# Patient Record
Sex: Female | Born: 1935
Health system: Southern US, Community
[De-identification: ages and names within clinical notes are randomized; demographics above are authoritative.]

## PROBLEM LIST (undated history)

## (undated) DIAGNOSIS — I8390 Asymptomatic varicose veins of unspecified lower extremity: Secondary | ICD-10-CM

## (undated) DIAGNOSIS — K219 Gastro-esophageal reflux disease without esophagitis: Secondary | ICD-10-CM

## (undated) DIAGNOSIS — Z972 Presence of dental prosthetic device (complete) (partial): Secondary | ICD-10-CM

## (undated) DIAGNOSIS — D649 Anemia, unspecified: Secondary | ICD-10-CM

## (undated) DIAGNOSIS — K52832 Lymphocytic colitis: Secondary | ICD-10-CM

## (undated) DIAGNOSIS — M1990 Unspecified osteoarthritis, unspecified site: Secondary | ICD-10-CM

## (undated) DIAGNOSIS — C50919 Malignant neoplasm of unspecified site of unspecified female breast: Secondary | ICD-10-CM

## (undated) DIAGNOSIS — Z923 Personal history of irradiation: Secondary | ICD-10-CM

## (undated) DIAGNOSIS — I82409 Acute embolism and thrombosis of unspecified deep veins of unspecified lower extremity: Secondary | ICD-10-CM

## (undated) DIAGNOSIS — K5190 Ulcerative colitis, unspecified, without complications: Secondary | ICD-10-CM

## (undated) DIAGNOSIS — I10 Essential (primary) hypertension: Secondary | ICD-10-CM

## (undated) DIAGNOSIS — N183 Chronic kidney disease, stage 3 unspecified: Secondary | ICD-10-CM

## (undated) HISTORY — PX: HERNIA REPAIR: SHX51

## (undated) HISTORY — DX: Essential (primary) hypertension: I10

## (undated) HISTORY — PX: CATARACT EXTRACTION W/ INTRAOCULAR LENS IMPLANT: SHX1309

## (undated) HISTORY — PX: DILATION AND CURETTAGE OF UTERUS: SHX78

## (undated) HISTORY — DX: Lymphocytic colitis: K52.832

## (undated) HISTORY — PX: NECK SURGERY: SHX720

## (undated) HISTORY — PX: MELANOMA EXCISION: SHX5266

## (undated) HISTORY — PX: HYSTEROSCOPY: SHX211

## (undated) HISTORY — DX: Ulcerative colitis, unspecified, without complications: K51.90

---

## 1996-02-10 DIAGNOSIS — C50919 Malignant neoplasm of unspecified site of unspecified female breast: Secondary | ICD-10-CM | POA: Insufficient documentation

## 1996-02-10 DIAGNOSIS — Z923 Personal history of irradiation: Secondary | ICD-10-CM

## 1996-02-10 HISTORY — DX: Personal history of irradiation: Z92.3

## 1996-02-10 HISTORY — DX: Malignant neoplasm of unspecified site of unspecified female breast: C50.919

## 1996-02-10 HISTORY — PX: BREAST LUMPECTOMY: SHX2

## 2004-04-24 ENCOUNTER — Ambulatory Visit: Payer: Self-pay | Admitting: Oncology

## 2004-05-10 ENCOUNTER — Ambulatory Visit: Payer: Self-pay | Admitting: Oncology

## 2004-06-30 ENCOUNTER — Ambulatory Visit: Payer: Self-pay | Admitting: Oncology

## 2004-10-27 ENCOUNTER — Ambulatory Visit: Payer: Self-pay | Admitting: Oncology

## 2004-11-09 ENCOUNTER — Ambulatory Visit: Payer: Self-pay | Admitting: Oncology

## 2005-04-28 ENCOUNTER — Ambulatory Visit: Payer: Self-pay | Admitting: Oncology

## 2005-05-10 ENCOUNTER — Ambulatory Visit: Payer: Self-pay | Admitting: Oncology

## 2005-05-14 ENCOUNTER — Ambulatory Visit: Payer: Self-pay | Admitting: Gastroenterology

## 2005-06-10 ENCOUNTER — Ambulatory Visit: Payer: Self-pay | Admitting: Gastroenterology

## 2005-06-23 ENCOUNTER — Ambulatory Visit: Payer: Self-pay | Admitting: Oncology

## 2005-07-10 ENCOUNTER — Ambulatory Visit: Payer: Self-pay | Admitting: Oncology

## 2005-08-09 ENCOUNTER — Ambulatory Visit: Payer: Self-pay | Admitting: Oncology

## 2005-09-09 ENCOUNTER — Ambulatory Visit: Payer: Self-pay | Admitting: Oncology

## 2005-10-13 ENCOUNTER — Other Ambulatory Visit: Payer: Self-pay

## 2005-10-13 ENCOUNTER — Ambulatory Visit: Payer: Self-pay | Admitting: Unknown Physician Specialty

## 2005-10-15 ENCOUNTER — Ambulatory Visit: Payer: Self-pay | Admitting: Unknown Physician Specialty

## 2005-12-24 ENCOUNTER — Ambulatory Visit: Payer: Self-pay | Admitting: Oncology

## 2006-01-09 ENCOUNTER — Ambulatory Visit: Payer: Self-pay | Admitting: Oncology

## 2006-06-22 ENCOUNTER — Ambulatory Visit: Payer: Self-pay | Admitting: Oncology

## 2006-06-22 ENCOUNTER — Ambulatory Visit: Payer: Self-pay | Admitting: Internal Medicine

## 2006-07-11 ENCOUNTER — Ambulatory Visit: Payer: Self-pay | Admitting: Oncology

## 2006-07-11 ENCOUNTER — Ambulatory Visit: Payer: Self-pay | Admitting: Internal Medicine

## 2006-08-10 ENCOUNTER — Ambulatory Visit: Payer: Self-pay | Admitting: Oncology

## 2006-12-11 ENCOUNTER — Ambulatory Visit: Payer: Self-pay | Admitting: Oncology

## 2006-12-29 ENCOUNTER — Ambulatory Visit: Payer: Self-pay | Admitting: Oncology

## 2007-01-10 ENCOUNTER — Ambulatory Visit: Payer: Self-pay | Admitting: Oncology

## 2007-05-06 ENCOUNTER — Other Ambulatory Visit: Payer: Self-pay

## 2007-05-06 ENCOUNTER — Ambulatory Visit: Payer: Self-pay | Admitting: Unknown Physician Specialty

## 2007-05-12 ENCOUNTER — Ambulatory Visit: Payer: Self-pay | Admitting: Unknown Physician Specialty

## 2007-07-06 ENCOUNTER — Ambulatory Visit: Payer: Self-pay | Admitting: Oncology

## 2007-07-11 ENCOUNTER — Ambulatory Visit: Payer: Self-pay | Admitting: Oncology

## 2007-08-10 ENCOUNTER — Ambulatory Visit: Payer: Self-pay | Admitting: Oncology

## 2008-01-03 ENCOUNTER — Ambulatory Visit: Payer: Self-pay | Admitting: Oncology

## 2008-01-04 ENCOUNTER — Ambulatory Visit: Payer: Self-pay | Admitting: Oncology

## 2008-01-10 ENCOUNTER — Ambulatory Visit: Payer: Self-pay | Admitting: Oncology

## 2008-06-09 ENCOUNTER — Ambulatory Visit: Payer: Self-pay | Admitting: Oncology

## 2008-07-04 ENCOUNTER — Ambulatory Visit: Payer: Self-pay | Admitting: Oncology

## 2008-07-10 ENCOUNTER — Ambulatory Visit: Payer: Self-pay | Admitting: Oncology

## 2008-08-09 ENCOUNTER — Ambulatory Visit: Payer: Self-pay | Admitting: Oncology

## 2009-06-09 ENCOUNTER — Ambulatory Visit: Payer: Self-pay | Admitting: Nurse Practitioner

## 2009-07-04 ENCOUNTER — Ambulatory Visit: Payer: Self-pay | Admitting: Oncology

## 2009-07-10 ENCOUNTER — Ambulatory Visit: Payer: Self-pay | Admitting: Nurse Practitioner

## 2009-07-10 ENCOUNTER — Ambulatory Visit: Payer: Self-pay | Admitting: Oncology

## 2009-08-09 ENCOUNTER — Ambulatory Visit: Payer: Self-pay | Admitting: Oncology

## 2010-02-09 HISTORY — PX: COLONOSCOPY: SHX174

## 2010-07-02 LAB — HM PAP SMEAR: HM Pap smear: NEGATIVE

## 2010-07-05 LAB — HM COLONOSCOPY

## 2010-08-04 ENCOUNTER — Ambulatory Visit: Payer: Self-pay | Admitting: Internal Medicine

## 2010-08-05 LAB — HM MAMMOGRAPHY: HM Mammogram: NORMAL

## 2010-10-14 ENCOUNTER — Other Ambulatory Visit: Payer: Self-pay | Admitting: *Deleted

## 2010-10-14 MED ORDER — HYDRALAZINE HCL 25 MG PO TABS: 25.0000 mg | ORAL_TABLET | Freq: Three times a day (TID) | ORAL | Status: DC

## 2010-11-26 ENCOUNTER — Other Ambulatory Visit: Payer: Self-pay | Admitting: Internal Medicine

## 2010-11-26 MED ORDER — METOPROLOL SUCCINATE ER 50 MG PO TB24: 50.0000 mg | ORAL_TABLET | Freq: Two times a day (BID) | ORAL | Status: DC

## 2010-11-26 NOTE — Telephone Encounter (Signed)
Pt needs you to call in metoprolol  succ (toprol) er 50mg     Pt takes 2 tablets daily.  She is also taking steriods she will be off of this nov 1.  She only has enough for 13 days [facility] mail order

## 2011-01-13 ENCOUNTER — Other Ambulatory Visit: Payer: Self-pay | Admitting: Internal Medicine

## 2011-02-04 ENCOUNTER — Encounter: Payer: Self-pay | Admitting: Internal Medicine

## 2011-02-04 ENCOUNTER — Ambulatory Visit (INDEPENDENT_AMBULATORY_CARE_PROVIDER_SITE_OTHER): Payer: Medicare Other | Admitting: Internal Medicine

## 2011-02-04 VITALS — BP 142/82 | HR 68 | Temp 98.6°F | Wt 165.0 lb

## 2011-02-04 DIAGNOSIS — K52832 Lymphocytic colitis: Secondary | ICD-10-CM | POA: Insufficient documentation

## 2011-02-04 DIAGNOSIS — E785 Hyperlipidemia, unspecified: Secondary | ICD-10-CM

## 2011-02-04 DIAGNOSIS — M899 Disorder of bone, unspecified: Secondary | ICD-10-CM

## 2011-02-04 DIAGNOSIS — I10 Essential (primary) hypertension: Secondary | ICD-10-CM | POA: Insufficient documentation

## 2011-02-04 DIAGNOSIS — M8580 Other specified disorders of bone density and structure, unspecified site: Secondary | ICD-10-CM

## 2011-02-04 DIAGNOSIS — K5289 Other specified noninfective gastroenteritis and colitis: Secondary | ICD-10-CM

## 2011-02-04 MED ORDER — METOPROLOL SUCCINATE ER 100 MG PO TB24: 100.0000 mg | ORAL_TABLET | Freq: Every day | ORAL | Status: DC

## 2011-02-04 NOTE — Progress Notes (Signed)
Subjective:    [name]: Mia Perez, female    DOB: August 05, 1933, 77 y.o.   MRN: [id number]  HPI 77YO female with h/o lymphocytic colitis and hypertension presents for follow up. In regards to colitis, notes she was not able to taper off budesonide, as she developed recurrent diarrhea. At present, using 3mg  budesonide daily and symptoms typically well controlled. Still occasional loose stool with certain foods.  No abdominal pain, bloody stool. Notes some abdominal weight gain on steroids.   In regards to HTN, did not bring record of BP today. Reports full compliance with meds. No chest pain, palpitations, headache. No LEE.   Outpatient Encounter Prescriptions as of 02/04/2011  Medication Sig Dispense Refill  . amLODipine (NORVASC) 5 MG tablet Take 5 mg by mouth daily.        . brimonidine-timolol (COMBIGAN) 0.2-0.5 % ophthalmic solution Place 1 drop into both eyes every 12 (twelve) hours.        . budesonide (ENTOCORT EC) 3 MG 24 hr capsule Take 3 mg by mouth every morning.        . Calcium Carbonate-Vit D-Min 1200-1000 MG-UNIT CHEW Chew by mouth daily.        . clidinium-chlordiazePOXIDE (LIBRAX) 2.5-5 MG per capsule Take 1 capsule by mouth 3 (three) times daily as needed. For Loose stools or abdominal cramping       . diphenoxylate-atropine (LOMOTIL) 2.5-0.025 MG per tablet Take 1 tablet by mouth 4 (four) times daily as needed. For loose stools       . hydrALAZINE (APRESOLINE) 25 MG tablet Take 25 mg by mouth 3 (three) times daily as needed. For elevated BP       . metoprolol (TOPROL-XL) 100 MG 24 hr tablet Take 1 tablet (100 mg total) by mouth daily.  90 tablet  4  . valsartan (DIOVAN) 320 MG tablet Take 320 mg by mouth daily.          Review of Systems  Constitutional: Negative for fever, chills, appetite change, fatigue and unexpected weight change.  HENT: Negative for ear pain, congestion, sore throat, trouble swallowing, neck pain, voice change and sinus pressure.   Eyes: Negative  for visual disturbance.  Respiratory: Negative for cough, shortness of breath, wheezing and stridor.   Cardiovascular: Negative for chest pain, palpitations and leg swelling.  Gastrointestinal: Positive for diarrhea (chronic). Negative for nausea, vomiting, abdominal pain, constipation, blood in stool, abdominal distention and anal bleeding.  Genitourinary: Negative for dysuria and flank pain.  Musculoskeletal: Negative for myalgias, arthralgias and gait problem.  Skin: Negative for color change and rash.  Neurological: Negative for dizziness and headaches.  Hematological: Negative for adenopathy. Does not bruise/bleed easily.  Psychiatric/Behavioral: Negative for suicidal ideas, sleep disturbance and dysphoric mood. The patient is not nervous/anxious.    BP 142/82  Pulse 68  Temp(Src) 98.6 F (37 C) (Oral)  Wt 165 lb (74.844 kg)  SpO2 96%     Objective:   Physical Exam  Constitutional: She is oriented to person, place, and time. She appears well-developed and well-nourished. No distress.  HENT:  Head: Normocephalic and atraumatic.  Right Ear: External ear normal.  Left Ear: External ear normal.  Nose: Nose normal.  Mouth/Throat: Oropharynx is clear and moist. No oropharyngeal exudate.  Eyes: Conjunctivae are normal. Pupils are equal, round, and reactive to light. Right eye exhibits no discharge. Left eye exhibits no discharge. No scleral icterus.  Neck: Normal range of motion. Neck supple. No tracheal deviation present. No thyromegaly present.  Cardiovascular: Normal rate, regular rhythm, normal heart sounds and intact distal pulses.  Exam reveals no gallop and no friction rub.   No murmur heard. Pulmonary/Chest: Effort normal and breath sounds normal. No respiratory distress. She has no wheezes. She has no rales. She exhibits no tenderness.  Abdominal: Soft. Bowel sounds are normal. She exhibits no distension and no mass. There is no tenderness. There is no rebound and no guarding.   Musculoskeletal: Normal range of motion. She exhibits no edema and no tenderness.  Lymphadenopathy:    She has no cervical adenopathy.  Neurological: She is alert and oriented to person, place, and time. No cranial nerve deficit. She exhibits normal muscle tone. Coordination normal.  Skin: Skin is warm and dry. No rash noted. She is not diaphoretic. No erythema. No pallor.  Psychiatric: She has a normal mood and affect. Her behavior is normal. Judgment and thought content normal.          Assessment & Plan:  1. Lymphocytic Colitis - Followed by Dr. Kinnie Scales. On budesonide. Has not been able to taper below 3mg  daily without severe diarrhea.  Symptoms fairly well controlled at this dose. Will continue. Will screen bone density given chronic steroid use.  2. Hypertension - BP fairly well controlled, esp in light of use of chronic steroids. Will check renal function with labs next week. Will continue current meds. Follow up 6 months.  3. Hyperlipidemia - Will check lipids and LFTs with labs next week.

## 2011-02-04 NOTE — Patient Instructions (Signed)
Labs next week.  Bone density test to be scheduled.  Follow up 6 months.

## 2011-02-12 ENCOUNTER — Other Ambulatory Visit (INDEPENDENT_AMBULATORY_CARE_PROVIDER_SITE_OTHER): Payer: Medicare Other | Admitting: *Deleted

## 2011-02-12 DIAGNOSIS — I10 Essential (primary) hypertension: Secondary | ICD-10-CM

## 2011-02-12 DIAGNOSIS — K52832 Lymphocytic colitis: Secondary | ICD-10-CM

## 2011-02-12 DIAGNOSIS — E785 Hyperlipidemia, unspecified: Secondary | ICD-10-CM

## 2011-02-12 DIAGNOSIS — K5289 Other specified noninfective gastroenteritis and colitis: Secondary | ICD-10-CM

## 2011-02-12 LAB — CBC WITH DIFFERENTIAL/PLATELET
Basophils Absolute: 0 10*3/uL (ref 0.0–0.1)
Eosinophils Absolute: 0.2 10*3/uL (ref 0.0–0.7)
Lymphocytes Relative: 22.5 % (ref 12.0–46.0)
Lymphs Abs: 1.7 10*3/uL (ref 0.7–4.0)
MCHC: 33.3 g/dL (ref 30.0–36.0)
Monocytes Relative: 4.9 % (ref 3.0–12.0)
Platelets: 265 10*3/uL (ref 150.0–400.0)
RDW: 15.1 % — ABNORMAL HIGH (ref 11.5–14.6)

## 2011-02-12 LAB — LIPID PANEL
Cholesterol: 195 mg/dL (ref 0–200)
HDL: 45.9 mg/dL (ref >39.00)
LDL Cholesterol: 127 mg/dL — ABNORMAL HIGH (ref 0–99)
Total CHOL/HDL Ratio: 4
Triglycerides: 113 mg/dL (ref 0.0–149.0)

## 2011-02-12 LAB — COMPREHENSIVE METABOLIC PANEL
AST: 14 U/L (ref 0–37)
Alkaline Phosphatase: 83 U/L (ref 39–117)
CO2: 26 mEq/L (ref 19–32)
Chloride: 111 mEq/L (ref 96–112)
Glucose, Bld: 82 mg/dL (ref 70–99)
Potassium: 3.2 mEq/L — ABNORMAL LOW (ref 3.5–5.1)
Sodium: 145 mEq/L (ref 135–145)

## 2011-02-13 ENCOUNTER — Telehealth: Payer: Self-pay | Admitting: *Deleted

## 2011-02-13 MED ORDER — POTASSIUM CHLORIDE CRYS ER 20 MEQ PO TBCR: 20.0000 meq | EXTENDED_RELEASE_TABLET | Freq: Every day | ORAL | Status: DC

## 2011-02-13 NOTE — Telephone Encounter (Signed)
Patient informed. 

## 2011-02-13 NOTE — Telephone Encounter (Signed)
Message copied by Vernie Murders on Fri Feb 13, 2011  6:19 PM ------      Message from: Duncan Dull      Created: Fri Feb 13, 2011  5:41 PM       Dr Dan Humphreys can discuss her cholesterol with her, but her potassium is low at 3.2  She takes a a daily diuretic,  Can you call her in potassium chloride 20 meq and have her take  One tablet daily  #30 .  No refills she may not need it long term so should recheck a BMET in one week to see if we can stop it 276.8

## 2011-02-17 ENCOUNTER — Ambulatory Visit: Payer: Self-pay | Admitting: Internal Medicine

## 2011-02-19 ENCOUNTER — Other Ambulatory Visit (INDEPENDENT_AMBULATORY_CARE_PROVIDER_SITE_OTHER): Payer: Medicare Other | Admitting: *Deleted

## 2011-02-19 DIAGNOSIS — E876 Hypokalemia: Secondary | ICD-10-CM

## 2011-02-19 LAB — BASIC METABOLIC PANEL WITH GFR
BUN: 25 mg/dL — ABNORMAL HIGH (ref 6–23)
CO2: 30 meq/L (ref 19–32)
Calcium: 9.5 mg/dL (ref 8.4–10.5)
Chloride: 104 meq/L (ref 96–112)
Creatinine, Ser: 1 mg/dL (ref 0.4–1.2)
GFR: 58.11 mL/min — ABNORMAL LOW
Glucose, Bld: 81 mg/dL (ref 70–99)
Potassium: 4 meq/L (ref 3.5–5.1)
Sodium: 141 meq/L (ref 135–145)

## 2011-04-14 ENCOUNTER — Encounter: Payer: Self-pay | Admitting: Internal Medicine

## 2011-04-15 ENCOUNTER — Encounter: Payer: Self-pay | Admitting: Internal Medicine

## 2011-08-05 ENCOUNTER — Ambulatory Visit: Payer: BC Managed Care – PPO | Admitting: Internal Medicine

## 2011-08-05 ENCOUNTER — Ambulatory Visit (INDEPENDENT_AMBULATORY_CARE_PROVIDER_SITE_OTHER): Payer: Medicare Other | Admitting: Internal Medicine

## 2011-08-05 ENCOUNTER — Encounter: Payer: Self-pay | Admitting: Internal Medicine

## 2011-08-05 VITALS — BP 140/90 | HR 70 | Temp 98.7°F | Ht 67.75 in | Wt 176.8 lb

## 2011-08-05 DIAGNOSIS — I10 Essential (primary) hypertension: Secondary | ICD-10-CM

## 2011-08-05 DIAGNOSIS — Z0000 Encounter for general adult medical examination without abnormal findings: Secondary | ICD-10-CM

## 2011-08-05 DIAGNOSIS — Z1239 Encounter for other screening for malignant neoplasm of breast: Secondary | ICD-10-CM

## 2011-08-05 DIAGNOSIS — D649 Anemia, unspecified: Secondary | ICD-10-CM

## 2011-08-05 DIAGNOSIS — N952 Postmenopausal atrophic vaginitis: Secondary | ICD-10-CM

## 2011-08-05 DIAGNOSIS — K52832 Lymphocytic colitis: Secondary | ICD-10-CM

## 2011-08-05 DIAGNOSIS — K5289 Other specified noninfective gastroenteritis and colitis: Secondary | ICD-10-CM

## 2011-08-05 MED ORDER — AMLODIPINE BESYLATE 5 MG PO TABS: 5.0000 mg | ORAL_TABLET | Freq: Every day | ORAL | Status: DC

## 2011-08-05 MED ORDER — VALSARTAN 320 MG PO TABS: 320.0000 mg | ORAL_TABLET | Freq: Every day | ORAL | Status: DC

## 2011-08-05 MED ORDER — ESTRADIOL 0.1 MG/GM VA CREA: 2.0000 g | TOPICAL_CREAM | Freq: Every day | VAGINAL | Status: DC

## 2011-08-05 NOTE — Assessment & Plan Note (Addendum)
Symptoms well controlled with use of budesonide, however having some weight gain and elevated blood pressure with this medication. However, alternative medications have not been helpful. Will plan to continue for now. Encouraged her to limit total caloric intake and to continue his regular physical activity in effort to avoid further weight gain while on Budesonide.

## 2011-08-05 NOTE — Assessment & Plan Note (Signed)
General exam is normal today including breast exam. Pap is deferred because of patient's age. Health maintenance is up to date. Will check CBC, CMP, lipid profile with labs today. Encouraged patient to continue efforts at healthy diet, with goal of limiting total caloric intake to approximately 1500 calories per day given recent issues with weight gain with use of budesonide. Encouraged regular physical activity. Appropriate screening performed. Patient will followup in 6 months.

## 2011-08-05 NOTE — Patient Instructions (Signed)
Record everything that you eat.  Try to limit calories to 1500 or less per day. Continue to exercise daily.

## 2011-08-05 NOTE — Assessment & Plan Note (Signed)
Blood pressure slightly elevated today at 140/90. Suspect blood pressure may be increased with use of budesonide. Encouraged patient to monitor blood pressure and call if blood pressure consistently greater than 140/90 at home. Will check renal function with labs today. Patient will followup in 6 months or sooner as needed.

## 2011-08-05 NOTE — Progress Notes (Signed)
Subjective:    [name]: Mia Perez, female    DOB: March 02, 1933, 78 y.o.   MRN: [id number]  HPI The patient is here for annual Medicare wellness examination and management of other chronic and acute problems.   The risk factors are reflected in the social history.  The roster of all physicians providing medical care to patient - is listed in the Snapshot section of the chart.  Activities of daily living:  The patient is 100% independent in all ADLs: dressing, toileting, feeding as well as independent mobility  Home safety : The patient has smoke detectors in the home. They wear seatbelts.  There are firearms at home. There is no violence in the home.   There is no risks for hepatitis, STDs or HIV. There is no history of blood transfusion. They have no travel history to infectious disease endemic areas of the world.  The patient has seen their dentist in the last six month (Dr.Petersen). They have seen their eye doctor in the last year (Dr. Ilda Mori). No current issues with hearing. They have deferred audiologic testing in the last year.    They do not  have excessive sun exposure. Discussed the need for sun protection: hats, long sleeves and use of sunscreen if there is significant sun exposure.   Diet: the importance of a healthy diet is discussed. They do have a healthy diet.  The benefits of regular aerobic exercise were discussed. She walks 3-5 days per week.  Depression screen: there are no signs or vegative symptoms of depression- irritability, change in appetite, anhedonia, sadness/tearfullness.  Cognitive assessment: the patient manages all their financial and personal affairs and is actively engaged. They could relate day,date,year and events.  The following portions of the patient's history were reviewed and updated as appropriate: allergies, current medications, past family history, past medical history,  past surgical history, past social history  and problem  list.  Visual acuity was not assessed per patient preference since she has regular follow up with her ophthalmologist. Hearing and body mass index were assessed and reviewed.   During the course of the visit the patient was educated and counseled about appropriate screening and preventive services including : fall prevention , diabetes screening, nutrition counseling, colorectal cancer screening, and recommended immunizations.    In regards to chronic health issues, pt has h/o lymphocytic colitis. She notes that symptoms of diarrhea have been well controlled with the use of budesonide. She has, however noted some abdominal bloating and weight gain with this medication. She has been trying to limit calories and has been exercising on a regular basis, typically walking for 30 minutes 3-5 days per week.  In regards to hypertension, she does not regularly check her blood pressure at home. She does report full compliance with her medications. She denies any headache, palpitations, chest pain.  Outpatient Encounter Prescriptions as of 08/05/2011  Medication Sig Dispense Refill  . amLODipine (NORVASC) 5 MG tablet Take 1 tablet (5 mg total) by mouth daily.  90 tablet  4  . brimonidine-timolol (COMBIGAN) 0.2-0.5 % ophthalmic solution Place 1 drop into both eyes every 12 (twelve) hours.        . budesonide (ENTOCORT EC) 3 MG 24 hr capsule Take 3 mg by mouth every morning.        . Calcium Carbonate-Vit D-Min 1200-1000 MG-UNIT CHEW Chew by mouth daily.        . clidinium-chlordiazePOXIDE (LIBRAX) 2.5-5 MG per capsule Take 1 capsule by mouth 3 (three) times  daily as needed. For Loose stools or abdominal cramping       . diphenoxylate-atropine (LOMOTIL) 2.5-0.025 MG per tablet Take 1 tablet by mouth 4 (four) times daily as needed. For loose stools       . estradiol (ESTRACE VAGINAL) 0.1 MG/GM vaginal cream Place 0.25 Applicatorfuls vaginally daily. Insert one applicator vaginally as directed  42.5 g  6  .  metoprolol (TOPROL-XL) 100 MG 24 hr tablet Take 1 tablet (100 mg total) by mouth daily.  90 tablet  4  . valsartan (DIOVAN) 320 MG tablet Take 1 tablet (320 mg total) by mouth daily.  90 tablet  4   BP 140/90  Pulse 70  Temp 98.7 F (37.1 C) (Oral)  Ht 5' 7.75" (1.721 m)  Wt 176 lb 12 oz (80.173 kg)  BMI 27.07 kg/m2  SpO2 96%  Review of Systems  Constitutional: Negative for fever, chills, appetite change, fatigue and unexpected weight change.  HENT: Negative for ear pain, congestion, sore throat, trouble swallowing, neck pain, voice change and sinus pressure.   Eyes: Negative for visual disturbance.  Respiratory: Negative for cough, shortness of breath, wheezing and stridor.   Cardiovascular: Negative for chest pain, palpitations and leg swelling.  Gastrointestinal: Positive for abdominal distention. Negative for nausea, vomiting, abdominal pain, diarrhea, constipation, blood in stool and anal bleeding.  Genitourinary: Negative for dysuria and flank pain.  Musculoskeletal: Negative for myalgias, arthralgias and gait problem.  Skin: Negative for color change and rash.  Neurological: Negative for dizziness and headaches.  Hematological: Negative for adenopathy. Does not bruise/bleed easily.  Psychiatric/Behavioral: Negative for suicidal ideas, disturbed wake/sleep cycle and dysphoric mood. The patient is not nervous/anxious.        Objective:   Physical Exam  Constitutional: She is oriented to person, place, and time. She appears well-developed and well-nourished. No distress.  HENT:  Head: Normocephalic and atraumatic.  Right Ear: External ear normal.  Left Ear: External ear normal.  Nose: Nose normal.  Mouth/Throat: Oropharynx is clear and moist. No oropharyngeal exudate.  Eyes: Conjunctivae are normal. Pupils are equal, round, and reactive to light. Right eye exhibits no discharge. Left eye exhibits no discharge. No scleral icterus.  Neck: Normal range of motion. Neck supple.  No tracheal deviation present. No thyromegaly present.  Cardiovascular: Normal rate, regular rhythm, normal heart sounds and intact distal pulses.  Exam reveals no gallop and no friction rub.   No murmur heard. Pulmonary/Chest: Effort normal and breath sounds normal. No accessory muscle usage. Not tachypneic. No respiratory distress. She has no decreased breath sounds. She has no wheezes. She has no rhonchi. She has no rales. She exhibits no tenderness. Right breast exhibits no inverted nipple, no mass, no nipple discharge, no skin change and no tenderness. Left breast exhibits no inverted nipple, no mass, no nipple discharge, no skin change and no tenderness. Breasts are symmetrical.  Abdominal: Soft. Bowel sounds are normal. She exhibits no distension and no mass. There is no tenderness. There is no guarding.  Musculoskeletal: Normal range of motion. She exhibits no edema and no tenderness.  Lymphadenopathy:    She has no cervical adenopathy.  Neurological: She is alert and oriented to person, place, and time. No cranial nerve deficit. She exhibits normal muscle tone. Coordination normal.  Skin: Skin is warm and dry. No rash noted. She is not diaphoretic. No erythema. No pallor.  Psychiatric: She has a normal mood and affect. Her behavior is normal. Judgment and thought content normal.  Assessment & Plan:

## 2011-08-06 LAB — CBC WITH DIFFERENTIAL/PLATELET
Basophils Absolute: 0 10*3/uL (ref 0.0–0.1)
Lymphocytes Relative: 11.1 % — ABNORMAL LOW (ref 12.0–46.0)
Monocytes Relative: 4.3 % (ref 3.0–12.0)
Platelets: 291 10*3/uL (ref 150.0–400.0)
RDW: 14.9 % — ABNORMAL HIGH (ref 11.5–14.6)
WBC: 9.9 10*3/uL (ref 4.5–10.5)

## 2011-08-06 LAB — COMPREHENSIVE METABOLIC PANEL
AST: 20 U/L (ref 0–37)
BUN: 23 mg/dL (ref 6–23)
CO2: 25 mEq/L (ref 19–32)
Calcium: 9.6 mg/dL (ref 8.4–10.5)
Chloride: 107 mEq/L (ref 96–112)
Creatinine, Ser: 1.1 mg/dL (ref 0.4–1.2)
GFR: 50.86 mL/min — ABNORMAL LOW (ref >60.00)
Total Bilirubin: 0.3 mg/dL (ref 0.3–1.2)

## 2011-08-07 LAB — MICROALBUMIN / CREATININE URINE RATIO
Creatinine,U: 96.1 mg/dL
Microalb Creat Ratio: 2.5 mg/g (ref 0.0–30.0)

## 2011-09-07 ENCOUNTER — Telehealth: Payer: Self-pay | Admitting: Internal Medicine

## 2011-09-07 DIAGNOSIS — Z853 Personal history of malignant neoplasm of breast: Secondary | ICD-10-CM

## 2011-09-07 NOTE — Telephone Encounter (Signed)
Order entered and faxed to [phone].

## 2011-09-07 NOTE — Telephone Encounter (Signed)
Patient is having a diagnostic bilateral mammogram in the morning, please put an order in so we can fax it to [phone].  Patient has history of breast cancer that is why it can't be a screening.

## 2011-09-08 ENCOUNTER — Ambulatory Visit: Payer: Self-pay | Admitting: Internal Medicine

## 2011-09-15 ENCOUNTER — Encounter: Payer: Self-pay | Admitting: Internal Medicine

## 2011-11-10 ENCOUNTER — Other Ambulatory Visit: Payer: Self-pay | Admitting: Internal Medicine

## 2011-11-11 ENCOUNTER — Other Ambulatory Visit: Payer: Self-pay | Admitting: Internal Medicine

## 2011-12-03 ENCOUNTER — Telehealth: Payer: Self-pay | Admitting: Internal Medicine

## 2011-12-03 ENCOUNTER — Ambulatory Visit (INDEPENDENT_AMBULATORY_CARE_PROVIDER_SITE_OTHER): Payer: Medicare Other | Admitting: Internal Medicine

## 2011-12-03 ENCOUNTER — Encounter: Payer: Self-pay | Admitting: Internal Medicine

## 2011-12-03 VITALS — BP 122/68 | HR 88 | Temp 99.3°F | Ht 65.75 in | Wt 168.5 lb

## 2011-12-03 DIAGNOSIS — K5289 Other specified noninfective gastroenteritis and colitis: Secondary | ICD-10-CM

## 2011-12-03 DIAGNOSIS — J069 Acute upper respiratory infection, unspecified: Secondary | ICD-10-CM

## 2011-12-03 DIAGNOSIS — K52832 Lymphocytic colitis: Secondary | ICD-10-CM

## 2011-12-03 MED ORDER — AZITHROMYCIN 250 MG PO TABS: ORAL_TABLET | ORAL | Status: DC

## 2011-12-03 MED ORDER — ALBUTEROL SULFATE HFA 108 (90 BASE) MCG/ACT IN AERS: 2.0000 | INHALATION_SPRAY | Freq: Four times a day (QID) | RESPIRATORY_TRACT | Status: DC | PRN

## 2011-12-03 NOTE — Patient Instructions (Signed)
It was nice meeting you today.  I am sorry you have not been feeling well.  I am going to give you an antibiotic (Zpak) - to take as directed.  Mucinex DM in the am and Robitussin DM in the evening.  I am also going to prescribe an inhaler - 2 puffs four times per day as directed.  Rest.  Stay hydrated.  Let us know if symptoms do not resolve or if they worsen.

## 2011-12-03 NOTE — Telephone Encounter (Signed)
Caller: Mayo Ao; Patient Name: Mia Perez; PCP: Ronna Polio (Adults only); Best Callback Phone Number: [phone].  Pt calling today 12/03/11 regarding was at the the beach last week and was very windy.  When she got home she had laryngitis and now has cough.  Has been taking Mucinex DM.  Mucus is thick and clear.  However not coughing up much mucus.  Afebrile.  Emergent symptoms r/o by Cough Adult guidelines with exception of gradual onset of cough when lying down and relieved after being in a sitting or standing position.  (See Provider Within 24 Hours).  Appt scheduled for today at 11:15 AM with Dr. Dale [place].

## 2011-12-04 NOTE — Progress Notes (Signed)
  Subjective:    [name]: Anaaya Coulson, female    DOB: [birth year], 79 y.o.   MRN: [id number]  HPI 78 year old female with past history of hypertension and lymphocytic colitis who comes in today as a work in with concerns regarding increased congestion.  States she went to the beach and was out in the wind.  Subsequently lost her voice.  Then developed increased throat congestion.  Subjective fever a couple of days ago - stating that she could not get warm.  Increased congestion - mostly throat.  Occasionally productive of thick mucus.  No chest tightness or sob.  No history of asthma.  Has never used inhalers.  Some nausea earlier in the week.  Had one episode of emesis.  States she is staying hydrated.  Has issues with her bowels - lymphocytic colitis.    Past Medical History  Diagnosis Date  . Lymphocytic colitis   . Hypertension     Review of Systems Patient denies any headache, lightheadedness or dizziness.  No sinus symptoms.  States the congestion is "all in her throat".  No chest pain, tightness or palpitations.  No increased shortness of breath.  Some cough occasionally productive of thick sputum.  No abdominal pain or cramping.  Drinking plenty of fluids.   No bowel change.   No urine change.        Objective:   Physical Exam Filed Vitals:   12/03/11 1130  BP: 122/68  Pulse: 88  Temp: 99.3 F (22.31 C)   78 year old female in no acute distress.   HEENT:  Nares - clear.  OP- without lesions or erythema.  TMs visualized - without erythema. NECK:  Supple, nontender.    HEART:  Appears to be regular. LUNGS:  Without crackles or wheezing audible.  Increased congestion - cleared with coughing.  Respirations even and unlabored.   RADIAL PULSE:  Equal bilaterally.                    Assessment & Plan:  URI.  Treat with Zpak as directed.  Mucinex DM in the am and Robitussin DM in the evening.  Albuterol inhaler as directed.  Rest.  Fluids.  Explained to her if symptoms changed,  worsened or did not resolve - she was to be reevaluated.

## 2011-12-07 NOTE — Assessment & Plan Note (Signed)
Discussed antibiotic usage and bowel issues.  Add a probiotic or yogurt.  Follow.

## 2012-02-04 ENCOUNTER — Ambulatory Visit (INDEPENDENT_AMBULATORY_CARE_PROVIDER_SITE_OTHER): Payer: Medicare Other | Admitting: Internal Medicine

## 2012-02-04 ENCOUNTER — Encounter: Payer: Self-pay | Admitting: Internal Medicine

## 2012-02-04 VITALS — BP 140/88 | HR 90 | Temp 99.0°F | Resp 16 | Wt 167.2 lb

## 2012-02-04 DIAGNOSIS — J40 Bronchitis, not specified as acute or chronic: Secondary | ICD-10-CM

## 2012-02-04 DIAGNOSIS — Z1331 Encounter for screening for depression: Secondary | ICD-10-CM

## 2012-02-04 MED ORDER — LEVOFLOXACIN 500 MG PO TABS: 500.0000 mg | ORAL_TABLET | Freq: Every day | ORAL | Status: DC

## 2012-02-04 MED ORDER — HYDROCOD POLST-CHLORPHEN POLST 10-8 MG/5ML PO LQCR: 5.0000 mL | Freq: Two times a day (BID) | ORAL | Status: DC | PRN

## 2012-02-04 MED ORDER — PREDNISONE (PAK) 10 MG PO TABS: ORAL_TABLET | ORAL | Status: DC

## 2012-02-04 NOTE — Progress Notes (Signed)
Subjective:    [name]: Mia Perez, female    DOB: 1933/12/30, 78 y.o.   MRN: [id number]  HPI 78 year old female with history of hypertension and lymphocytic colitis presents for followup. Her primary concern today is a one-week history of nasal congestion, cough, and shortness of breath. She has had some subjective fever and chills at home.  Fever seems to have resolved. However, over the last week she has developed worsening cough and shortness of breath. Cough is productive of purulent sputum. She has been taking her inhalers including albuterol with no improvement. She has also been using some over-the-counter medications such as Mucinex with no improvement. She denies chest pain.  Outpatient Encounter Prescriptions as of 02/04/2012  Medication Sig Dispense Refill  . albuterol (PROVENTIL HFA;VENTOLIN HFA) 108 (90 BASE) MCG/ACT inhaler Inhale 2 puffs into the lungs every 6 (six) hours as needed for wheezing.  1 Inhaler  0  . amLODipine (NORVASC) 5 MG tablet TAKE 1 TABLET DAILY  90 tablet  49  . brimonidine-timolol (COMBIGAN) 0.2-0.5 % ophthalmic solution Place 1 drop into both eyes every 12 (twelve) hours.        . budesonide (ENTOCORT EC) 3 MG 24 hr capsule Take 3 mg by mouth daily.       . Calcium Carbonate-Vit D-Min 1200-1000 MG-UNIT CHEW Chew by mouth daily.        . clidinium-chlordiazePOXIDE (LIBRAX) 2.5-5 MG per capsule Take 1 capsule by mouth 3 (three) times daily as needed. For Loose stools or abdominal cramping       . DIOVAN 320 MG tablet TAKE 1 TABLET DAILY  90 tablet  49  . diphenoxylate-atropine (LOMOTIL) 2.5-0.025 MG per tablet Take 1 tablet by mouth 4 (four) times daily as needed. For loose stools       . estradiol (ESTRACE VAGINAL) 0.1 MG/GM vaginal cream Place 0.25 Applicatorfuls vaginally daily. Insert one applicator vaginally as directed  42.5 g  6  . metoprolol (TOPROL-XL) 100 MG 24 hr tablet Take 1 tablet (100 mg total) by mouth daily.  90 tablet  4   BP 140/88   Pulse 90  Temp 99 F (37.2 C) (Oral)  Resp 16  Wt 167 lb 4 oz (75.864 kg)  SpO2 92%  Review of Systems  Constitutional: Positive for fever, chills and fatigue. Negative for unexpected weight change.  HENT: Positive for nosebleeds, congestion, sore throat, rhinorrhea and postnasal drip. Negative for hearing loss, ear pain, facial swelling, sneezing, mouth sores, trouble swallowing, neck pain, neck stiffness, voice change, sinus pressure, tinnitus and ear discharge.   Eyes: Negative for pain, discharge, redness and visual disturbance.  Respiratory: Positive for cough, shortness of breath and wheezing. Negative for chest tightness and stridor.   Cardiovascular: Negative for chest pain, palpitations and leg swelling.  Musculoskeletal: Negative for myalgias and arthralgias.  Skin: Negative for color change and rash.  Neurological: Negative for dizziness, weakness, light-headedness and headaches.  Hematological: Negative for adenopathy.       Objective:   Physical Exam  Constitutional: She is oriented to person, place, and time. She appears well-developed and well-nourished. No distress.  HENT:  Head: Normocephalic and atraumatic.  Right Ear: External ear normal.  Left Ear: External ear normal.  Nose: Nose normal.  Mouth/Throat: Oropharynx is clear and moist. No oropharyngeal exudate.  Eyes: Conjunctivae normal are normal. Pupils are equal, round, and reactive to light. Right eye exhibits no discharge. Left eye exhibits no discharge. No scleral icterus.  Neck: Normal range of motion.  Neck supple. No tracheal deviation present. No thyromegaly present.  Cardiovascular: Normal rate, regular rhythm, normal heart sounds and intact distal pulses.  Exam reveals no gallop and no friction rub.   No murmur heard. Pulmonary/Chest: Effort normal. No accessory muscle usage. Not tachypneic. No respiratory distress. She has decreased breath sounds (prolonged expiration). She has wheezes. She has rhonchi.  She has no rales. She exhibits no tenderness.  Musculoskeletal: Normal range of motion. She exhibits no edema and no tenderness.  Lymphadenopathy:    She has no cervical adenopathy.  Neurological: She is alert and oriented to person, place, and time. No cranial nerve deficit. She exhibits normal muscle tone. Coordination normal.  Skin: Skin is warm and dry. No rash noted. She is not diaphoretic. No erythema. No pallor.  Psychiatric: She has a normal mood and affect. Her behavior is normal. Judgment and thought content normal.          Assessment & Plan:

## 2012-02-04 NOTE — Assessment & Plan Note (Signed)
Symptoms and exam are most consistent with acute bronchitis. Will treat with prednisone taper, Levaquin, and use Tussionex as needed for cough. Patient will call if symptoms are not improving over the next 24-48 hours. If no improvement, would favor getting chest x-ray. She will return to clinic for followup in 4 days.

## 2012-02-08 ENCOUNTER — Encounter: Payer: Self-pay | Admitting: Internal Medicine

## 2012-02-08 ENCOUNTER — Ambulatory Visit (INDEPENDENT_AMBULATORY_CARE_PROVIDER_SITE_OTHER): Payer: Medicare Other | Admitting: Internal Medicine

## 2012-02-08 VITALS — BP 150/78 | HR 61 | Temp 98.1°F | Resp 16 | Wt 172.0 lb

## 2012-02-08 DIAGNOSIS — J40 Bronchitis, not specified as acute or chronic: Secondary | ICD-10-CM

## 2012-02-08 NOTE — Assessment & Plan Note (Signed)
Symptoms and exam have improved. Will continue and finish prednisone taper and levaquin. Pt will RTC in 1 week for recheck. She will call sooner if any problems such as recurrent fever, dyspnea, chest pain.

## 2012-02-08 NOTE — Progress Notes (Signed)
Subjective:    [name]: Mia Perez, female    DOB: 10/23/1933, 78 y.o.   MRN: [id number]  HPI 78YO female with h/o lymphocytic colitis presents for recheck after recent visit for bronchitis. She reports symptoms of shortness of breath, cough have improved.  She continues to have some cough productive of purulent sputum. She notes some fatigue, and has not been able to complete some normal activities such as cleaning her home.  She denies any fever, chills, chest pain.  Outpatient Encounter Prescriptions as of 02/08/2012  Medication Sig Dispense Refill  . albuterol (PROVENTIL HFA;VENTOLIN HFA) 108 (90 BASE) MCG/ACT inhaler Inhale 2 puffs into the lungs every 6 (six) hours as needed for wheezing.  1 Inhaler  0  . amLODipine (NORVASC) 5 MG tablet TAKE 1 TABLET DAILY  90 tablet  49  . brimonidine-timolol (COMBIGAN) 0.2-0.5 % ophthalmic solution Place 1 drop into both eyes every 12 (twelve) hours.        . budesonide (ENTOCORT EC) 3 MG 24 hr capsule Take 3 mg by mouth daily.       . Calcium Carbonate-Vit D-Min 1200-1000 MG-UNIT CHEW Chew by mouth daily.        . chlorpheniramine-HYDROcodone (TUSSIONEX) 10-8 MG/5ML LQCR Take 5 mLs by mouth every 12 (twelve) hours as needed.  140 mL  0  . clidinium-chlordiazePOXIDE (LIBRAX) 2.5-5 MG per capsule Take 1 capsule by mouth 3 (three) times daily as needed. For Loose stools or abdominal cramping       . DIOVAN 320 MG tablet TAKE 1 TABLET DAILY  90 tablet  49  . diphenoxylate-atropine (LOMOTIL) 2.5-0.025 MG per tablet Take 1 tablet by mouth 4 (four) times daily as needed. For loose stools       . estradiol (ESTRACE VAGINAL) 0.1 MG/GM vaginal cream Place 0.25 Applicatorfuls vaginally daily. Insert one applicator vaginally as directed  42.5 g  6  . levofloxacin (LEVAQUIN) 500 MG tablet Take 1 tablet (500 mg total) by mouth daily.  7 tablet  0  . metoprolol (TOPROL-XL) 100 MG 24 hr tablet Take 1 tablet (100 mg total) by mouth daily.  90 tablet  4  .  predniSONE (STERAPRED UNI-PAK) 10 MG tablet Take 60mg  day 1 then taper by 10mg  daily  21 tablet  0   BP 150/78  Pulse 61  Temp 98.1 F (36.7 C) (Oral)  Resp 16  Wt 172 lb (78.019 kg)  SpO2 97%  Review of Systems  Constitutional: Positive for fatigue. Negative for fever, chills and unexpected weight change.  HENT: Negative for hearing loss, ear pain, nosebleeds, congestion, sore throat, facial swelling, rhinorrhea, sneezing, mouth sores, trouble swallowing, neck pain, neck stiffness, voice change, postnasal drip, sinus pressure, tinnitus and ear discharge.   Eyes: Negative for pain, discharge, redness and visual disturbance.  Respiratory: Positive for cough and shortness of breath. Negative for chest tightness, wheezing and stridor.   Cardiovascular: Negative for chest pain, palpitations and leg swelling.  Musculoskeletal: Negative for myalgias and arthralgias.  Skin: Negative for color change and rash.  Neurological: Negative for dizziness, weakness, light-headedness and headaches.  Hematological: Negative for adenopathy.       Objective:   Physical Exam  Constitutional: She is oriented to person, place, and time. She appears well-developed and well-nourished. No distress.  HENT:  Head: Normocephalic and atraumatic.  Right Ear: External ear normal.  Left Ear: External ear normal.  Nose: Nose normal.  Mouth/Throat: Oropharynx is clear and moist. No oropharyngeal exudate.  Eyes: Conjunctivae  normal are normal. Pupils are equal, round, and reactive to light. Right eye exhibits no discharge. Left eye exhibits no discharge. No scleral icterus.  Neck: Normal range of motion. Neck supple. No tracheal deviation present. No thyromegaly present.  Cardiovascular: Normal rate, regular rhythm, normal heart sounds and intact distal pulses.  Exam reveals no gallop and no friction rub.   No murmur heard. Pulmonary/Chest: Effort normal. No accessory muscle usage. Not tachypneic. No respiratory  distress. She has no decreased breath sounds. She has no wheezes. She has rhonchi (scattered rhonchi). She has no rales. She exhibits no tenderness.  Musculoskeletal: Normal range of motion. She exhibits no edema and no tenderness.  Lymphadenopathy:    She has no cervical adenopathy.  Neurological: She is alert and oriented to person, place, and time. No cranial nerve deficit. She exhibits normal muscle tone. Coordination normal.  Skin: Skin is warm and dry. No rash noted. She is not diaphoretic. No erythema. No pallor.  Psychiatric: She has a normal mood and affect. Her behavior is normal. Judgment and thought content normal.          Assessment & Plan:

## 2012-02-10 DIAGNOSIS — K5190 Ulcerative colitis, unspecified, without complications: Secondary | ICD-10-CM

## 2012-02-10 HISTORY — DX: Ulcerative colitis, unspecified, without complications: K51.90

## 2012-02-10 HISTORY — PX: BREAST SURGERY: SHX581

## 2012-02-10 HISTORY — PX: BREAST EXCISIONAL BIOPSY: SUR124

## 2012-02-15 ENCOUNTER — Encounter: Payer: Self-pay | Admitting: Internal Medicine

## 2012-02-15 ENCOUNTER — Ambulatory Visit (INDEPENDENT_AMBULATORY_CARE_PROVIDER_SITE_OTHER): Payer: Medicare PPO | Admitting: Internal Medicine

## 2012-02-15 VITALS — BP 110/74 | HR 60 | Temp 98.7°F | Ht 67.75 in | Wt 167.0 lb

## 2012-02-15 DIAGNOSIS — J40 Bronchitis, not specified as acute or chronic: Secondary | ICD-10-CM

## 2012-02-15 NOTE — Progress Notes (Signed)
Subjective:    [name]: Mia Perez, female    DOB: September 20, 1933, 79 y.o.   MRN: [id number]  HPI 79 year old female with history of colitis presents for followup after recent episode of bronchitis. She reports that she is much improved compared to previous visit. Cough has nearly completely resolved. She is not taking cough syrup and over 2 weeks. She continues to have some hoarseness of her voice, but this too seems to be improving. She denies any fever or chills. She reports her appetite and energy level have improved. She denies any new concerns today.  Outpatient Encounter Prescriptions as of 02/15/2012  Medication Sig Dispense Refill  . albuterol (PROVENTIL HFA;VENTOLIN HFA) 108 (90 BASE) MCG/ACT inhaler Inhale 2 puffs into the lungs every 6 (six) hours as needed for wheezing.  1 Inhaler  0  . amLODipine (NORVASC) 5 MG tablet TAKE 1 TABLET DAILY  90 tablet  49  . brimonidine-timolol (COMBIGAN) 0.2-0.5 % ophthalmic solution Place 1 drop into both eyes every 12 (twelve) hours.        . budesonide (ENTOCORT EC) 3 MG 24 hr capsule Take 3 mg by mouth daily.       . Calcium Carbonate-Vit D-Min 1200-1000 MG-UNIT CHEW Chew by mouth daily.        . clidinium-chlordiazePOXIDE (LIBRAX) 2.5-5 MG per capsule Take 1 capsule by mouth 3 (three) times daily as needed. For Loose stools or abdominal cramping       . DIOVAN 320 MG tablet TAKE 1 TABLET DAILY  90 tablet  49  . diphenoxylate-atropine (LOMOTIL) 2.5-0.025 MG per tablet Take 1 tablet by mouth 4 (four) times daily as needed. For loose stools       . estradiol (ESTRACE VAGINAL) 0.1 MG/GM vaginal cream Place 0.25 Applicatorfuls vaginally daily. Insert one applicator vaginally as directed  42.5 g  6  . metoprolol (TOPROL-XL) 100 MG 24 hr tablet Take 1 tablet (100 mg total) by mouth daily.  90 tablet  4   BP 110/74  Pulse 60  Temp 98.7 F (37.1 C) (Oral)  Ht 5' 7.75" (1.721 m)  Wt 167 lb (75.751 kg)  BMI 25.58 kg/m2  SpO2 97%  Review of Systems    Constitutional: Negative for fever, chills, appetite change, fatigue and unexpected weight change.  HENT: Positive for voice change. Negative for ear pain, congestion, sore throat, trouble swallowing, neck pain and sinus pressure.   Eyes: Negative for visual disturbance.  Respiratory: Positive for cough. Negative for shortness of breath, wheezing and stridor.   Cardiovascular: Negative for chest pain, palpitations and leg swelling.  Gastrointestinal: Negative for nausea, vomiting, abdominal pain, diarrhea, constipation, blood in stool, abdominal distention and anal bleeding.  Genitourinary: Negative for dysuria and flank pain.  Musculoskeletal: Negative for myalgias, arthralgias and gait problem.  Skin: Negative for color change and rash.  Neurological: Negative for dizziness and headaches.  Hematological: Negative for adenopathy. Does not bruise/bleed easily.  Psychiatric/Behavioral: Negative for suicidal ideas, sleep disturbance and dysphoric mood. The patient is not nervous/anxious.        Objective:   Physical Exam  Constitutional: She is oriented to person, place, and time. She appears well-developed and well-nourished. No distress.  HENT:  Head: Normocephalic and atraumatic.  Right Ear: External ear normal.  Left Ear: External ear normal.  Nose: Nose normal.  Mouth/Throat: Oropharynx is clear and moist. No oropharyngeal exudate.  Eyes: Conjunctivae normal are normal. Pupils are equal, round, and reactive to light. Right eye exhibits no discharge. Left  eye exhibits no discharge. No scleral icterus.  Neck: Normal range of motion. Neck supple. No tracheal deviation present. No thyromegaly present.  Cardiovascular: Normal rate, regular rhythm, normal heart sounds and intact distal pulses.  Exam reveals no gallop and no friction rub.   No murmur heard. Pulmonary/Chest: Effort normal and breath sounds normal. No accessory muscle usage. Not tachypneic. No respiratory distress. She has no  wheezes. She has no rales. She exhibits no tenderness.  Musculoskeletal: Normal range of motion. She exhibits no edema and no tenderness.  Lymphadenopathy:    She has no cervical adenopathy.  Neurological: She is alert and oriented to person, place, and time. No cranial nerve deficit. She exhibits normal muscle tone. Coordination normal.  Skin: Skin is warm and dry. No rash noted. She is not diaphoretic. No erythema. No pallor.  Psychiatric: She has a normal mood and affect. Her behavior is normal. Judgment and thought content normal.          Assessment & Plan:

## 2012-02-15 NOTE — Assessment & Plan Note (Signed)
Symptoms much improved. Will continue to monitor hoarseness of voice. Likely related to inflammation from recent URI and coughing.  If this persists greater than 4 weeks, patient will return to clinic. Otherwise, plan for followup visit in 3 months.

## 2012-02-23 ENCOUNTER — Ambulatory Visit (INDEPENDENT_AMBULATORY_CARE_PROVIDER_SITE_OTHER): Payer: Medicare PPO | Admitting: *Deleted

## 2012-02-23 DIAGNOSIS — Z23 Encounter for immunization: Secondary | ICD-10-CM

## 2012-04-02 ENCOUNTER — Other Ambulatory Visit: Payer: Self-pay | Admitting: Internal Medicine

## 2012-05-23 ENCOUNTER — Encounter: Payer: Self-pay | Admitting: Internal Medicine

## 2012-05-23 ENCOUNTER — Ambulatory Visit (INDEPENDENT_AMBULATORY_CARE_PROVIDER_SITE_OTHER): Payer: Medicare PPO | Admitting: Internal Medicine

## 2012-05-23 VITALS — BP 140/88 | HR 68 | Temp 99.0°F | Wt 170.0 lb

## 2012-05-23 DIAGNOSIS — K5289 Other specified noninfective gastroenteritis and colitis: Secondary | ICD-10-CM

## 2012-05-23 DIAGNOSIS — H409 Unspecified glaucoma: Secondary | ICD-10-CM | POA: Insufficient documentation

## 2012-05-23 DIAGNOSIS — I10 Essential (primary) hypertension: Secondary | ICD-10-CM

## 2012-05-23 DIAGNOSIS — K52832 Lymphocytic colitis: Secondary | ICD-10-CM

## 2012-05-23 DIAGNOSIS — Z0000 Encounter for general adult medical examination without abnormal findings: Secondary | ICD-10-CM

## 2012-05-23 MED ORDER — AMLODIPINE BESYLATE 5 MG PO TABS: ORAL_TABLET | ORAL | Status: DC

## 2012-05-23 MED ORDER — DIPHENOXYLATE-ATROPINE 2.5-0.025 MG PO TABS: 1.0000 | ORAL_TABLET | Freq: Four times a day (QID) | ORAL | Status: DC | PRN

## 2012-05-23 MED ORDER — METOPROLOL SUCCINATE ER 100 MG PO TB24: ORAL_TABLET | ORAL | Status: DC

## 2012-05-23 MED ORDER — VALSARTAN 320 MG PO TABS: ORAL_TABLET | ORAL | Status: DC

## 2012-05-23 NOTE — Progress Notes (Signed)
Subjective:    [name]: Mia Perez, female    DOB: November 29, 1933, 79 y.o.   MRN: [id number]  HPI 79 year old female with history of lymphocytic colitis, hypertension, glaucoma presents for followup. In interim since her last visit, she was seen by her GI physician who reportedly told her that there is nothing further he can offer her. He recommended continuing budesonide, and a limited dose of 1 mg daily. He also recommended continuing the Librax and Lomotil as needed. She reports that on a daily basis she typically has several loose stools during the day. She takes librax in the morning and lomotil up to 4 times daily. She denies abdominal pain, blood in her stool. Symptoms have been ongoing for years.  In regards to HTN, she denies any chest pain, palpitations, headache. She is compliant with medications.  Outpatient Encounter Prescriptions as of 05/23/2012  Medication Sig Dispense Refill  . albuterol (PROVENTIL HFA;VENTOLIN HFA) 108 (90 BASE) MCG/ACT inhaler Inhale 2 puffs into the lungs every 6 (six) hours as needed for wheezing.  1 Inhaler  0  . amLODipine (NORVASC) 5 MG tablet TAKE 1 TABLET DAILY  90 tablet  4  . brimonidine-timolol (COMBIGAN) 0.2-0.5 % ophthalmic solution Place 1 drop into both eyes every 12 (twelve) hours.        . Calcium Carbonate-Vit D-Min 1200-1000 MG-UNIT CHEW Chew by mouth daily.        . clidinium-chlordiazePOXIDE (LIBRAX) 2.5-5 MG per capsule Take 1 capsule by mouth 3 (three) times daily as needed. For Loose stools or abdominal cramping       . diphenoxylate-atropine (LOMOTIL) 2.5-0.025 MG per tablet Take 1 tablet by mouth 4 (four) times daily as needed. For loose stools  360 tablet  1  . estradiol (ESTRACE VAGINAL) 0.1 MG/GM vaginal cream Place 0.25 Applicatorfuls vaginally daily. Insert one applicator vaginally as directed  42.5 g  6  . metoprolol succinate (TOPROL-XL) 100 MG 24 hr tablet TAKE 1 TABLET DAILY  90 tablet  3  . valsartan (DIOVAN) 320 MG tablet TAKE  1 TABLET DAILY  90 tablet  4  . budesonide (ENTOCORT EC) 3 MG 24 hr capsule Take 3 mg by mouth daily.        No facility-administered encounter medications on file as of 05/23/2012.   BP 140/88  Pulse 68  Temp(Src) 99 F (37.2 C) (Oral)  Wt 170 lb (77.111 kg)  BMI 26.03 kg/m2  SpO2 94%  Review of Systems  Constitutional: Negative for fever, chills, appetite change, fatigue and unexpected weight change.  HENT: Negative for ear pain, congestion, sore throat, trouble swallowing, neck pain, voice change and sinus pressure.   Eyes: Negative for visual disturbance.  Respiratory: Negative for cough, shortness of breath, wheezing and stridor.   Cardiovascular: Negative for chest pain, palpitations and leg swelling.  Gastrointestinal: Positive for diarrhea. Negative for nausea, vomiting, abdominal pain, constipation, blood in stool, abdominal distention and anal bleeding.  Genitourinary: Negative for dysuria and flank pain.  Musculoskeletal: Negative for myalgias, arthralgias and gait problem.  Skin: Negative for color change and rash.  Neurological: Negative for dizziness and headaches.  Hematological: Negative for adenopathy. Does not bruise/bleed easily.  Psychiatric/Behavioral: Negative for suicidal ideas, sleep disturbance and dysphoric mood. The patient is not nervous/anxious.        Objective:   Physical Exam  Constitutional: She is oriented to person, place, and time. She appears well-developed and well-nourished. No distress.  HENT:  Head: Normocephalic and atraumatic.  Right Ear: External  ear normal.  Left Ear: External ear normal.  Nose: Nose normal.  Mouth/Throat: Oropharynx is clear and moist. No oropharyngeal exudate.  Eyes: Conjunctivae are normal. Pupils are equal, round, and reactive to light. Right eye exhibits no discharge. Left eye exhibits no discharge. No scleral icterus.  Neck: Normal range of motion. Neck supple. No tracheal deviation present. No thyromegaly  present.  Cardiovascular: Normal rate, regular rhythm, normal heart sounds and intact distal pulses.  Exam reveals no gallop and no friction rub.   No murmur heard. Pulmonary/Chest: Effort normal and breath sounds normal. No accessory muscle usage. Not tachypneic. No respiratory distress. She has no decreased breath sounds. She has no wheezes. She has no rhonchi. She has no rales. She exhibits no tenderness.  Abdominal: Soft. Bowel sounds are normal. She exhibits no distension and no mass. There is no tenderness. There is no rebound and no guarding.  Musculoskeletal: Normal range of motion. She exhibits no edema and no tenderness.  Lymphadenopathy:    She has no cervical adenopathy.  Neurological: She is alert and oriented to person, place, and time. No cranial nerve deficit. She exhibits normal muscle tone. Coordination normal.  Skin: Skin is warm and dry. No rash noted. She is not diaphoretic. No erythema. No pallor.  Psychiatric: She has a normal mood and affect. Her behavior is normal. Judgment and thought content normal.          Assessment & Plan:

## 2012-05-23 NOTE — Assessment & Plan Note (Signed)
Followed by opthalmology. On Combigan. Use of budesonide limited by glaucoma, discussed today.

## 2012-05-23 NOTE — Assessment & Plan Note (Signed)
Symptoms have been persistent despite tapering budesonide to 1 mg daily. However, use of budesonide is limited by glaucoma. Will continue Budesonide 1mg  daily along with Librax and prn Lomotil. Offered referral to tertiary care Center for additional evaluation. Patient declined.

## 2012-05-23 NOTE — Assessment & Plan Note (Addendum)
BP Readings from Last 3 Encounters:  05/23/12 140/88  02/15/12 110/74  02/08/12 150/78   BP generally well controlled with current medications. Will continue. Plan to repeat renal function with wellness visit in 2 months.

## 2012-08-31 ENCOUNTER — Encounter: Payer: Self-pay | Admitting: Internal Medicine

## 2012-08-31 ENCOUNTER — Ambulatory Visit (INDEPENDENT_AMBULATORY_CARE_PROVIDER_SITE_OTHER): Payer: Medicare PPO | Admitting: Internal Medicine

## 2012-08-31 ENCOUNTER — Other Ambulatory Visit (INDEPENDENT_AMBULATORY_CARE_PROVIDER_SITE_OTHER): Payer: Medicare PPO

## 2012-08-31 VITALS — BP 150/90 | HR 69 | Temp 98.2°F | Ht 67.5 in | Wt 163.0 lb

## 2012-08-31 DIAGNOSIS — I10 Essential (primary) hypertension: Secondary | ICD-10-CM

## 2012-08-31 DIAGNOSIS — N952 Postmenopausal atrophic vaginitis: Secondary | ICD-10-CM

## 2012-08-31 DIAGNOSIS — Z0000 Encounter for general adult medical examination without abnormal findings: Secondary | ICD-10-CM

## 2012-08-31 DIAGNOSIS — K52832 Lymphocytic colitis: Secondary | ICD-10-CM

## 2012-08-31 DIAGNOSIS — K5289 Other specified noninfective gastroenteritis and colitis: Secondary | ICD-10-CM

## 2012-08-31 LAB — COMPREHENSIVE METABOLIC PANEL
ALT: 17 U/L (ref 0–35)
AST: 16 U/L (ref 0–37)
Calcium: 9.5 mg/dL (ref 8.4–10.5)
Chloride: 108 mEq/L (ref 96–112)
Creatinine, Ser: 1.2 mg/dL (ref 0.4–1.2)
Sodium: 141 mEq/L (ref 135–145)
Total Protein: 6.7 g/dL (ref 6.0–8.3)

## 2012-08-31 LAB — CBC WITH DIFFERENTIAL/PLATELET
Eosinophils Relative: 1.7 % (ref 0.0–5.0)
HCT: 38.2 % (ref 36.0–46.0)
Monocytes Relative: 5.2 % (ref 3.0–12.0)
Neutrophils Relative %: 73.5 % (ref 43.0–77.0)
Platelets: 308 10*3/uL (ref 150.0–400.0)
RBC: 4.2 Mil/uL (ref 3.87–5.11)
WBC: 8.2 10*3/uL (ref 4.5–10.5)

## 2012-08-31 LAB — MICROALBUMIN / CREATININE URINE RATIO
Creatinine,U: 150.1 mg/dL
Microalb Creat Ratio: 0.5 mg/g (ref 0.0–30.0)

## 2012-08-31 LAB — LDL CHOLESTEROL, DIRECT: Direct LDL: 152.8 mg/dL

## 2012-08-31 LAB — LIPID PANEL
HDL: 35.2 mg/dL — ABNORMAL LOW (ref >39.00)
VLDL: 36.6 mg/dL (ref 0.0–40.0)

## 2012-08-31 LAB — TSH: TSH: 4.4 u[IU]/mL (ref 0.35–5.50)

## 2012-08-31 MED ORDER — ESTRADIOL 0.1 MG/GM VA CREA: 2.0000 g | TOPICAL_CREAM | Freq: Every day | VAGINAL | Status: DC

## 2012-08-31 NOTE — Assessment & Plan Note (Signed)
Symptoms well controlled with Budesonide. Will continue. 

## 2012-08-31 NOTE — Assessment & Plan Note (Signed)
BP Readings from Last 3 Encounters:  08/31/12 150/90  05/23/12 140/88  02/15/12 110/74   BP slightly elevated today, however generally well controlled with current medications. Encouraged her to monitor and call if BP consistently >140/90.

## 2012-08-31 NOTE — Assessment & Plan Note (Signed)
General exam including breast and pelvic exam normal today. PAP deferred because of age and previous normal PAP in 2012. Health maintenance is UTD. Reviewed recent labs including CBC, CMP, lipids which were stable. Encouraged healthy diet and regular physical activity. Mammogram ordered. Follow up 6 months and prn.

## 2012-08-31 NOTE — Progress Notes (Signed)
Subjective:    [name]: Mia Perez, female    DOB: 1933/11/18, 79 y.o.   MRN: [id number]  HPI The patient is here for annual Medicare wellness examination and management of other chronic and acute problems.   The risk factors are reflected in the social history.  The roster of all physicians providing medical care to patient - is listed in the Snapshot section of the chart.  Activities of daily living:  The patient is 100% independent in all ADLs: dressing, toileting, feeding as well as independent mobility  Home safety : The patient has smoke detectors in the home. They wear seatbelts.  There are firearms at home. There is no violence in the home.   There is no risks for hepatitis, STDs or HIV. There is no history of blood transfusion. They have no travel history to infectious disease endemic areas of the world.  The patient has seen their dentist in the last six month (Dr.Petersen). They have seen their eye doctor in the last year (Dr. Ilda Mori). No current issues with hearing. They have deferred audiologic testing in the last year.    They do not  have excessive sun exposure. Discussed the need for sun protection: hats, long sleeves and use of sunscreen if there is significant sun exposure.   Diet: the importance of a healthy diet is discussed. They do have a healthy diet.  The benefits of regular aerobic exercise were discussed. She exercises at church 3-4 days per week and goes to [employer].  Depression screen: there are no signs or vegative symptoms of depression- irritability, change in appetite, anhedonia, sadness/tearfullness.  Cognitive assessment: the patient manages all their financial and personal affairs and is actively engaged. They could relate day,date,year and events.  The following portions of the patient's history were reviewed and updated as appropriate: allergies, current medications, past family history, past medical history,  past surgical history, past social  history  and problem list.  Visual acuity was not assessed per patient preference since she has regular follow up with her ophthalmologist. Hearing and body mass index were assessed and reviewed.   During the course of the visit the patient was educated and counseled about appropriate screening and preventive services including : fall prevention , diabetes screening, nutrition counseling, colorectal cancer screening, and recommended immunizations.    In regards to chronic health issues, pt has h/o lymphocytic colitis. She notes that symptoms of diarrhea have been well controlled with the use of budesonide. Symptoms are better controlled with Budesonide 2 pills daily, however she only takes 1 pill daily because of glaucoma and risk of worsening intraocular pressure. She takes 2 pills of Budesonide when she is traveling.  In regards to hypertension, she does not regularly check her blood pressure at home. She does report full compliance with her medications. She denies any headache, palpitations, chest pain.   Review of Systems  Constitutional: Negative for fever, chills, appetite change, fatigue and unexpected weight change.  HENT: Negative for ear pain, congestion, sore throat, trouble swallowing, neck pain, voice change and sinus pressure.   Eyes: Negative for visual disturbance.  Respiratory: Negative for cough, shortness of breath, wheezing and stridor.   Cardiovascular: Negative for chest pain, palpitations and leg swelling.  Gastrointestinal: Positive for diarrhea (chronic) and abdominal distention (chronic bloating). Negative for nausea, vomiting, abdominal pain, constipation, blood in stool and anal bleeding.  Genitourinary: Negative for dysuria and flank pain.  Musculoskeletal: Negative for myalgias, arthralgias and gait problem.  Skin: Negative for  color change and rash.  Neurological: Negative for dizziness and headaches.  Hematological: Negative for adenopathy. Does not bruise/bleed  easily.  Psychiatric/Behavioral: Negative for suicidal ideas, sleep disturbance and dysphoric mood. The patient is not nervous/anxious.        Objective:   Physical Exam  Constitutional: She is oriented to person, place, and time. She appears well-developed and well-nourished. No distress.  HENT:  Head: Normocephalic and atraumatic.  Right Ear: External ear normal.  Left Ear: External ear normal.  Nose: Nose normal.  Mouth/Throat: Oropharynx is clear and moist. No oropharyngeal exudate.  Eyes: Conjunctivae are normal. Pupils are equal, round, and reactive to light. Right eye exhibits no discharge. Left eye exhibits no discharge. No scleral icterus.  Neck: Normal range of motion. Neck supple. No tracheal deviation present. No thyromegaly present.  Cardiovascular: Normal rate, regular rhythm, normal heart sounds and intact distal pulses.  Exam reveals no gallop and no friction rub.   No murmur heard. Pulmonary/Chest: Effort normal and breath sounds normal. No respiratory distress. She has no wheezes. She has no rales. She exhibits no tenderness.  Abdominal: Soft. Bowel sounds are normal. She exhibits no distension and no mass. There is no tenderness. There is no rebound and no guarding.  Genitourinary: Rectum normal, vagina normal and uterus normal.    No breast swelling, tenderness, discharge or bleeding. Pelvic exam was performed with patient supine. There is no rash, tenderness or lesion on the right labia. There is no rash, tenderness or lesion on the left labia. Uterus is not enlarged and not tender. Cervix exhibits no motion tenderness and no discharge. Right adnexum displays no mass, no tenderness and no fullness. Left adnexum displays no mass, no tenderness and no fullness. No erythema or tenderness around the vagina. No vaginal discharge found.  Musculoskeletal: Normal range of motion. She exhibits no edema and no tenderness.  Lymphadenopathy:    She has no cervical adenopathy.    Neurological: She is alert and oriented to person, place, and time. No cranial nerve deficit. She exhibits normal muscle tone. Coordination normal.  Skin: Skin is warm and dry. No rash noted. She is not diaphoretic. No erythema. No pallor.  Psychiatric: She has a normal mood and affect. Her behavior is normal. Judgment and thought content normal.          Assessment & Plan:

## 2012-09-20 ENCOUNTER — Ambulatory Visit: Payer: Self-pay | Admitting: Internal Medicine

## 2012-09-21 ENCOUNTER — Telehealth: Payer: Self-pay | Admitting: Internal Medicine

## 2012-09-21 ENCOUNTER — Ambulatory Visit: Payer: Self-pay | Admitting: Internal Medicine

## 2012-09-21 DIAGNOSIS — R920 Mammographic microcalcification found on diagnostic imaging of breast: Secondary | ICD-10-CM

## 2012-09-21 NOTE — Telephone Encounter (Signed)
Spoke with patient and she informed me this has been scheduled for today at 11:00

## 2012-09-21 NOTE — Telephone Encounter (Signed)
Additional images on mammogram showed microcalcifications which were "low suspicion" of malignancy, however they have recommended having a surgeon look at the images and decide if biopsy might be helpful. Does the pt have a Careers adviser that she would prefer?

## 2012-09-21 NOTE — Telephone Encounter (Signed)
Patient informed and verbalized understanding. Does not have a preference of any surgeon. She will be out of town for a week, beginning Saturday 8/16 to 8/23

## 2012-09-21 NOTE — Telephone Encounter (Signed)
Mammogram showed microcalcifications in the right breast. Recommended compression views. Has this been scheduled?

## 2012-09-22 ENCOUNTER — Encounter: Payer: Self-pay | Admitting: *Deleted

## 2012-09-27 ENCOUNTER — Encounter: Payer: Self-pay | Admitting: Internal Medicine

## 2012-09-29 ENCOUNTER — Ambulatory Visit: Payer: Self-pay | Admitting: General Surgery

## 2012-09-29 ENCOUNTER — Encounter: Payer: Self-pay | Admitting: Internal Medicine

## 2012-10-03 ENCOUNTER — Ambulatory Visit (INDEPENDENT_AMBULATORY_CARE_PROVIDER_SITE_OTHER): Payer: Medicare PPO | Admitting: General Surgery

## 2012-10-03 ENCOUNTER — Encounter: Payer: Self-pay | Admitting: General Surgery

## 2012-10-03 VITALS — BP 124/68 | HR 68 | Resp 12 | Ht 67.0 in | Wt 168.0 lb

## 2012-10-03 DIAGNOSIS — R920 Mammographic microcalcification found on diagnostic imaging of breast: Secondary | ICD-10-CM

## 2012-10-03 NOTE — Progress Notes (Signed)
[name]: Mia Perez, female   DOB: Dec 11, 1933, 79 y.o.   MRN: [id number]  No chief complaint on file.   HPI Mia Perez is a 79 y.o. female who presents for a breast evaluation. The most recent mammogram was done on 8/12 & 13 at [facility].  The patient was treated for left breast cancer in 1998 by Mia Perez, M.D..  The patient made use of tamoxifen followed by what sounds like an aromatase inhibitor until GI side effects prompted early sensation. This was administered under the care of Mia Perez, M.D.  Patient does not perform regular self breast checks and does get regular mammograms done.   HPI  Past Medical History  Diagnosis Date  . Lymphocytic colitis   . Hypertension   . Cancer 16 yrs ago    breast, left  . Ulcerative colitis 2014    Past Surgical History  Procedure Laterality Date  . Breast surgery Left 1998    lumpectomy, with radiation    Family History  Problem Relation Age of Onset  . Heart disease Mother   . Hypertension Mother   . Cancer Mother     ovarian  . Hypertension Sister   . Hypertension Brother     Social History History  Substance Use Topics  . Smoking status: Never Smoker   . Smokeless tobacco: Never Used  . Alcohol Use: No    Allergies  Allergen Reactions  . Penicillins Rash    Current Outpatient Prescriptions  Medication Sig Dispense Refill  . albuterol (PROVENTIL HFA;VENTOLIN HFA) 108 (90 BASE) MCG/ACT inhaler Inhale 2 puffs into the lungs every 6 (six) hours as needed for wheezing.  1 Inhaler  0  . amLODipine (NORVASC) 5 MG tablet TAKE 1 TABLET DAILY  90 tablet  4  . brimonidine-timolol (COMBIGAN) 0.2-0.5 % ophthalmic solution Place 1 drop into both eyes every 12 (twelve) hours.        . budesonide (ENTOCORT EC) 3 MG 24 hr capsule Take 3 mg by mouth daily.       . Calcium Carbonate-Vit D-Min 1200-1000 MG-UNIT CHEW Chew by mouth daily.        . clidinium-chlordiazePOXIDE (LIBRAX) 2.5-5 MG per capsule Take 1 capsule by  mouth 3 (three) times daily as needed. For Loose stools or abdominal cramping       . diphenoxylate-atropine (LOMOTIL) 2.5-0.025 MG per tablet Take 1 tablet by mouth 4 (four) times daily as needed. For loose stools  360 tablet  1  . estradiol (ESTRACE VAGINAL) 0.1 MG/GM vaginal cream Place 0.25 Applicatorfuls vaginally daily. Insert one applicator vaginally as directed  42.5 g  6  . metoprolol succinate (TOPROL-XL) 100 MG 24 hr tablet TAKE 1 TABLET DAILY  90 tablet  3  . valsartan (DIOVAN) 320 MG tablet TAKE 1 TABLET DAILY  90 tablet  4   No current facility-administered medications for this visit.    Review of Systems Review of Systems  Constitutional: Negative.   Respiratory: Negative.   Cardiovascular: Negative.     Blood pressure 124/68, pulse 68, resp. rate 12, height 5\' 7"  (1.702 m), weight 168 lb (76.204 kg).  Physical Exam Physical Exam  Constitutional: She is oriented to person, place, and time. She appears well-developed and well-nourished.  Neck: Neck supple.  Cardiovascular: Normal rate, regular rhythm and normal heart sounds.   Pulmonary/Chest: Effort normal and breath sounds normal. Right breast exhibits no inverted nipple, no mass, no nipple discharge, no skin change and no tenderness. Left breast exhibits  no inverted nipple, no mass, no nipple discharge, no skin change (well healed scar lower outer quadrant) and no tenderness.    Lymphadenopathy:    She has no cervical adenopathy.    She has no axillary adenopathy.  Neurological: She is alert and oriented to person, place, and time.  Skin: Skin is warm and dry.    Data Reviewed Mammograms dated September 20, 2012 showed a new area of microcalcifications in the right breast. Focal spot compression views showed that these were persistent. The left breast was unremarkable with significant deep calcifications evident. BI-RAD-4. Previous mammograms are unremarkable. The area of microcalcifications are fairly deep in the  breast, and this area has not been clearly imaged on the CC view in the past few years.  Assessment    New area of microcalcifications in the right breast.     Plan    The patient has a trip scheduled to [place] scheduled for early next month. It's reasonable to postpone this biopsy until her return.     Patient has been scheduled for a right breast stereotactic biopsy at [facility] for 11-07-12 at 3 pm. She will check-in at the [facility] at 2:30 pm.  This patient is aware of date, time, and instructions. Patient verbalizes understanding. (Please note: Patient declined having stereo biopsy on 10-24-12 because she will be on vacation.)    Mia Perez 10/03/2012, 9:01 PM

## 2012-10-03 NOTE — Patient Instructions (Addendum)
Breast Biopsy, Stereotactic A stereotactic breast biopsy takes a tissue sample from the breast with a special instrument. This is done when:  The problem (lump, abnormality, mass) can be seen on X-ray, but not felt on physical exam.  Suspicious, small calcium deposits (calcifications) are seen in the breast.  There is a change in shape or appearance of the breast, thickening, or asymmetry on mammogram (breast X-ray).  You have nipple changes (unusual or bloody discharge, crusting, retraction, dimpling).  Your caregiver is making a surgical diagnosis. The biopsy may be done on a special table, with your face down and your breasts placed through openings in the table. Computerized imaging (special form of X-rays) is used. The images are not obtained using regular X-ray film. So, exposure to radiation is reduced. Images are seen through several different angles. The surgeon removes small pieces of the suspicious tissue through a hollow needle. The tissue will be sent to the lab for analysis. The surgeon can look at the pictures right away, rather than wait for an X-ray to be developed. Your caregiver can mark the lesions (abnormal tissue formations) electronically. Then the computer can tell exactly where the problem is, or if it has moved. BENEFITS OF THE PROCEDURE  This is a good way to see if tiny lumps, abnormal looking tissue, or calcium deposits that you cannot feel are cancerous or require further treatment or follow-up.  Needle biopsy is a simple procedure. It may be performed in an outpatient imaging center. This means you have the procedure and go home the same day, without checking into a hospital.  It is less painful than open surgery. The results are as accurate as when a tissue sample is removed surgically.  The procedure is faster, less expensive, less invasive, does not distort the breast, and leaves little or no scar.  Breast defects, which can make future mammograms hard to read  and interpret, do not remain.  Recovery time is brief. Patients can soon resume their normal activities.  Using VAD (vacuum assisted device) may make it possible to remove entire lesions.  A breast biopsy can indicate if you need surgery, other treatment, or combined treatment. LET YOUR CAREGIVER KNOW ABOUT:  Allergies.  Medications taken, including herbs, eye drops, over-the-counter medications, and creams.  Use of steroids (by mouth or creams).  Previous problems with anesthetics or numbing medication.  If you are taking blood thinner medications or aspirin.  Possibility of pregnancy, if this applies.  History of blood clots (thrombophlebitis).  History of bleeding or blood problems.  Previous surgery.  Other health problems. RISKS AND COMPLICATIONS  Infection (germ growing in the wound). This can often be treated with antibiotics.  Bleeding, following surgery. Your surgeon takes every precaution to keep this from happening.  There is some concern that if a cancerous mass is present, cancer cells might be spread by the needle. Whether this actually happens is not known. It does not appear to be a significant risk.  X-ray guided breast biopsy is not infallible (not always correct). The problem may be missed or the extent of the problem may be underestimated. This would mean the biopsy did not manage to remove a piece of the diseased tissue or enough of the diseased tissue.  Lesions present, with calcium deposits scattered throughout the breast, are difficult to target by stereotactic method. Those lesions near the chest wall also are hard to learn about by this method. If the mammogram shows only a vague change in tissue density,  but no definite mass or nodule, the X-ray guided method may not be successful. Occasionally, even after a successful biopsy, the tissue diagnosis remains uncertain. A surgical biopsy will be needed, if abnormal or precancerous cells are found on core  biopsy.  Altering or deforming of the breast.  Unable to find, or missing the lesion.  Rarely, the needle may go through the chest wall into the lung area. TWO BIOPSY INSTRUMENTS MAY BE USED IN THE PROCEDURE The conventional biopsy device (core needle biopsy device) consists of an inner needle with a trough extending from it at one end, and an overlying sheath. It is attached to a spring-loaded mechanism that propels it forward. The trough fills with tissue. The outer sheath instantly moves forward to cut the tissue and keep it in the trough. Each sample is obtained in a fraction of a second. It is necessary to withdraw the needle after each sample is taken to collect the tissue.  A newer type of instrument, the VAD (vacuum assisted device), uses vacuum pressure to pull breast tissue into a needle and remove it. The needle does not need to be withdrawn after each sampling. Another advantage is that biopsies are obtained in an orderly manner, by rotating the device. This helps make sure that the entire area of interest will be sampled. When using the automated core biopsy needle, sampling is more random.  FOR COMFORT DURING THE TEST  Relax as much as possible.  Try to follow instructions, to speed up the test.  Let your caregiver know if you are uncomfortable, anxious, or in pain. PROCEDURE  You are awake during the procedure, and you go home the same day (outpatient). A specially trained radiologist will do this procedure. First, the skin is cleansed. Then, it is injected with a local anesthetic. A small nick is made in the skin, and the tip of the biopsy needle is put into the calculated site of the lesion. A special mammography machine uses ionizing radiation to help guide the radiologist's instrument to the site of the abnormal growth. At this point, stereo images are again obtained, to confirm that the needle tip is at the problem area. Usually 5 to 10 samples are collected when doing a core  biopsy. At least 12 are collected when using the vacuum assisted device (VAD). Then, a final set of images is obtained. If they show that the lesion has been mostly or completely removed, a small clip is left at the biopsy site. This is so that it can be easily located, in case the lesion turns out to be cancer. Afterward, the skin opening is stitched (sutured) or taped closed, and covered with a dressing. Your caregiver may apply a pressure dressing and an ice pack, to prevent bleeding and swelling in the breast.  X-ray guided breast biopsy can take 30 minutes to 1 hour, or more. The X-rays usually have no side effects, and no radiation remains in your body. There is usually little or no pain. Usually no scar is left from the tiny skin incision. Many women find that the major discomfort of the procedure is from lying on their stomach, or staying in 1 position for the length of the procedure. This discomfort may be reduced by carefully placed cushions. You should wear a good support bra to the procedure. You will be asked to remove jewelry, dentures, eye glasses, metal objects, or clothing that might interfere with the X-ray images. You may want to have someone with you, to  take you home after the procedure. AFTER THE PROCEDURE   After surgery, if you are doing well and have no problems, you will be allowed to go home.  You may resume your regular diet, or as directed by your caregiver. HOME CARE INSTRUCTIONS   Follow your caregiver's recommendations for medications, care of the biopsy site, follow-up appointments, and further treatment.  Only take over-the-counter or prescription medicines for pain, discomfort, or fever as directed by your caregiver.  An ice pack applied to the affected area may help with discomfort and keep the swelling down.  Change dressings as directed.  Wear a good support bra for as long as your caregiver recommends.  Avoid strenuous activity for at least 24 hours, or as  advised by your caregiver. Finding out the results of your test Not all test results are available during your visit. If your test results are not back during the visit, make an appointment with your caregiver to find out the results. Do not assume everything is normal if you have not heard from your caregiver or the medical facility. It is important for you to follow up on all of your test results.  SEEK MEDICAL CARE IF:   You develop a rash.  You have problems with your medicines.  You become lightheaded or dizzy. SEEK IMMEDIATE MEDICAL CARE IF:   There is increased bleeding (more than a small spot) from the biopsy site.  You notice redness, swelling, or increasing pain in the wound.  Pus is coming from the wound.  You have a fever.  You notice a bad smell coming from the wound or dressing.  You develop shortness of breath.  You develop chest pain.  You pass out. Document Released: 10/25/2002 Document Revised: 04/20/2011 Document Reviewed: 11/30/2008 [facility] Patient Information 2014 [place], [place].  Patient has been scheduled for a right breast stereotactic biopsy at [facility] for 11-07-12 at 3 pm. She will check-in at the [facility] at 2:30 pm.  This patient is aware of date, time, and instructions. Patient verbalizes understanding.

## 2012-10-17 ENCOUNTER — Ambulatory Visit: Payer: Self-pay | Admitting: General Surgery

## 2012-11-07 ENCOUNTER — Ambulatory Visit: Payer: Self-pay | Admitting: General Surgery

## 2012-11-07 DIAGNOSIS — R928 Other abnormal and inconclusive findings on diagnostic imaging of breast: Secondary | ICD-10-CM

## 2012-11-08 ENCOUNTER — Telehealth: Payer: Self-pay | Admitting: *Deleted

## 2012-11-08 ENCOUNTER — Other Ambulatory Visit: Payer: Self-pay | Admitting: General Surgery

## 2012-11-08 DIAGNOSIS — R920 Mammographic microcalcification found on diagnostic imaging of breast: Secondary | ICD-10-CM

## 2012-11-08 NOTE — Telephone Encounter (Signed)
This patient's surgery has been scheduled for 11/17/12 @ [facility]. She will call the office if she has any questions.

## 2012-11-10 ENCOUNTER — Ambulatory Visit: Payer: Self-pay | Admitting: General Surgery

## 2012-11-14 ENCOUNTER — Ambulatory Visit: Payer: Medicare Other

## 2012-11-17 ENCOUNTER — Encounter: Payer: Self-pay | Admitting: General Surgery

## 2012-11-17 ENCOUNTER — Ambulatory Visit: Payer: Self-pay | Admitting: General Surgery

## 2012-11-17 DIAGNOSIS — R920 Mammographic microcalcification found on diagnostic imaging of breast: Secondary | ICD-10-CM

## 2012-11-21 ENCOUNTER — Telehealth: Payer: Self-pay | Admitting: General Surgery

## 2012-11-21 ENCOUNTER — Encounter: Payer: Self-pay | Admitting: General Surgery

## 2012-11-21 LAB — PATHOLOGY REPORT

## 2012-11-21 NOTE — Telephone Encounter (Signed)
The patient was notified that the final pathology report had shown only fat necrosis. She reports she has been pain-free. She will return tomorrow is scheduled for a brief wound check.

## 2012-11-22 ENCOUNTER — Encounter: Payer: Self-pay | Admitting: General Surgery

## 2012-11-22 ENCOUNTER — Ambulatory Visit (INDEPENDENT_AMBULATORY_CARE_PROVIDER_SITE_OTHER): Payer: Medicare PPO | Admitting: General Surgery

## 2012-11-22 VITALS — BP 124/68 | HR 72 | Resp 12 | Ht 67.5 in | Wt 164.0 lb

## 2012-11-22 DIAGNOSIS — R920 Mammographic microcalcification found on diagnostic imaging of breast: Secondary | ICD-10-CM

## 2012-11-22 NOTE — Patient Instructions (Signed)
Patient advised she can use heat at incision site as needed. Patient to return as needed.

## 2012-11-22 NOTE — Progress Notes (Signed)
[name]: Mia Perez, female   DOB: [birth year], 79 y.o.   MRN: [id number]  Chief Complaint  Patient presents with  . Routine Post Op    right breast excision     HPI Mia Perez is a 79 y.o. female who presents for a post op right breast excision. The procedure was performed on 11/17/12. The patient denies any new problems at this time. She is doing well.  Microcalcifications had not been amenable to stereotactic biopsy. Open biopsy with needle localization was completed. The procedure was well tolerated.  HPI  Past Medical History  Diagnosis Date  . Lymphocytic colitis   . Hypertension   . Cancer 16 yrs ago    breast, left  . Ulcerative colitis 2014    Past Surgical History  Procedure Laterality Date  . Breast surgery Left 1998    lumpectomy, with radiation  . Breast surgery Right 2014    excision with wire localization    Family History  Problem Relation Age of Onset  . Heart disease Mother   . Hypertension Mother   . Cancer Mother     ovarian  . Hypertension Sister   . Hypertension Brother     Social History History  Substance Use Topics  . Smoking status: Never Smoker   . Smokeless tobacco: Never Used  . Alcohol Use: No    Allergies  Allergen Reactions  . Penicillins Rash    Current Outpatient Prescriptions  Medication Sig Dispense Refill  . albuterol (PROVENTIL HFA;VENTOLIN HFA) 108 (90 BASE) MCG/ACT inhaler Inhale 2 puffs into the lungs every 6 (six) hours as needed for wheezing.  1 Inhaler  0  . amLODipine (NORVASC) 5 MG tablet TAKE 1 TABLET DAILY  90 tablet  4  . brimonidine-timolol (COMBIGAN) 0.2-0.5 % ophthalmic solution Place 1 drop into both eyes every 12 (twelve) hours.        . budesonide (ENTOCORT EC) 3 MG 24 hr capsule Take 3 mg by mouth daily.       . Calcium Carbonate-Vit D-Min 1200-1000 MG-UNIT CHEW Chew by mouth daily.        . clidinium-chlordiazePOXIDE (LIBRAX) 2.5-5 MG per capsule Take 1 capsule by mouth 3 (three) times daily  as needed. For Loose stools or abdominal cramping       . diphenoxylate-atropine (LOMOTIL) 2.5-0.025 MG per tablet Take 1 tablet by mouth 4 (four) times daily as needed. For loose stools  360 tablet  1  . estradiol (ESTRACE VAGINAL) 0.1 MG/GM vaginal cream Place 0.25 Applicatorfuls vaginally daily. Insert one applicator vaginally as directed  42.5 g  6  . metoprolol succinate (TOPROL-XL) 100 MG 24 hr tablet TAKE 1 TABLET DAILY  90 tablet  3  . valsartan (DIOVAN) 320 MG tablet TAKE 1 TABLET DAILY  90 tablet  4   No current facility-administered medications for this visit.    Review of Systems Review of Systems  Constitutional: Negative.   Respiratory: Negative.   Cardiovascular: Negative.     Blood pressure 124/68, pulse 72, resp. rate 12, height 5' 7.5" (1.715 m), weight 164 lb (74.39 kg).  Physical Exam Physical Exam  Constitutional: She is oriented to person, place, and time. She appears well-developed and well-nourished.  Pulmonary/Chest:    Neurological: She is alert and oriented to person, place, and time.  Skin: Skin is warm and dry.    Data Reviewed Pathology showed fat necrosis.  Assessment    Doing well post biopsy.    Plan  The patient will resume annual screening mammograms in one year with her primary care provider.Earline Mayotte 11/23/2012, 6:56 AM

## 2012-12-15 ENCOUNTER — Other Ambulatory Visit: Payer: Self-pay

## 2013-03-06 ENCOUNTER — Encounter: Payer: Self-pay | Admitting: Internal Medicine

## 2013-03-06 ENCOUNTER — Ambulatory Visit (INDEPENDENT_AMBULATORY_CARE_PROVIDER_SITE_OTHER): Payer: Medicare PPO | Admitting: Internal Medicine

## 2013-03-06 VITALS — BP 114/78 | HR 87 | Temp 97.9°F | Wt 151.0 lb

## 2013-03-06 DIAGNOSIS — S81009A Unspecified open wound, unspecified knee, initial encounter: Secondary | ICD-10-CM

## 2013-03-06 DIAGNOSIS — S81802A Unspecified open wound, left lower leg, initial encounter: Secondary | ICD-10-CM

## 2013-03-06 DIAGNOSIS — S91009A Unspecified open wound, unspecified ankle, initial encounter: Secondary | ICD-10-CM

## 2013-03-06 DIAGNOSIS — K5289 Other specified noninfective gastroenteritis and colitis: Secondary | ICD-10-CM

## 2013-03-06 DIAGNOSIS — Z23 Encounter for immunization: Secondary | ICD-10-CM

## 2013-03-06 DIAGNOSIS — I10 Essential (primary) hypertension: Secondary | ICD-10-CM

## 2013-03-06 DIAGNOSIS — S81809A Unspecified open wound, unspecified lower leg, initial encounter: Secondary | ICD-10-CM

## 2013-03-06 DIAGNOSIS — K52832 Lymphocytic colitis: Secondary | ICD-10-CM

## 2013-03-06 LAB — COMPREHENSIVE METABOLIC PANEL
ALK PHOS: 72 U/L (ref 39–117)
ALT: 13 U/L (ref 0–35)
AST: 13 U/L (ref 0–37)
Albumin: 3.4 g/dL — ABNORMAL LOW (ref 3.5–5.2)
BUN: 24 mg/dL — AB (ref 6–23)
CHLORIDE: 108 meq/L (ref 96–112)
CO2: 26 mEq/L (ref 19–32)
CREATININE: 1 mg/dL (ref 0.4–1.2)
Calcium: 9.6 mg/dL (ref 8.4–10.5)
GFR: 54.6 mL/min — ABNORMAL LOW (ref >60.00)
Glucose, Bld: 108 mg/dL — ABNORMAL HIGH (ref 70–99)
Potassium: 3.9 mEq/L (ref 3.5–5.1)
Sodium: 140 mEq/L (ref 135–145)
Total Bilirubin: 0.4 mg/dL (ref 0.3–1.2)
Total Protein: 7.1 g/dL (ref 6.0–8.3)

## 2013-03-06 LAB — MICROALBUMIN / CREATININE URINE RATIO
CREATININE, U: 132.9 mg/dL
MICROALB/CREAT RATIO: 0.9 mg/g (ref 0.0–30.0)
Microalb, Ur: 1.2 mg/dL (ref 0.0–1.9)

## 2013-03-06 MED ORDER — AMLODIPINE BESYLATE 5 MG PO TABS: ORAL_TABLET | ORAL | Status: DC

## 2013-03-06 MED ORDER — BUDESONIDE 3 MG PO CP24: 3.0000 mg | ORAL_CAPSULE | Freq: Every day | ORAL | Status: DC

## 2013-03-06 MED ORDER — METOPROLOL SUCCINATE ER 100 MG PO TB24: ORAL_TABLET | ORAL | Status: DC

## 2013-03-06 MED ORDER — VALSARTAN 320 MG PO TABS: ORAL_TABLET | ORAL | Status: DC

## 2013-03-06 NOTE — Progress Notes (Signed)
Pre-visit discussion using our clinic review tool. No additional management support is needed unless otherwise documented below in the visit note.  

## 2013-03-06 NOTE — Progress Notes (Signed)
Subjective:    [name]: Mia Perez, female    DOB: [birth year], 80 y.o.   MRN: [id number]  HPI 80 year old female with history of lymphocytic colitis and hypertension presents for followup. In regards to lymphocytic colitis, she notes recent worsening of chronic diarrhea secondary to use of antibiotics. However, in general she thinks this is improving. She continues on budesonide. She denies any persistent abdominal pain, fever, chills, blood in her stool.  In regards to hypertension, she is compliant with her medications. No recent chest pain, palpitations, headache.  She recently had a fall while she was walking at a local service station. She scraped her left anterior lower leg. She was seen at urgent care because she developed a redness around the wound. She was treated with Bactrim. She reports that wound is much improved. She denies any pain, swelling, fever, chills.  Outpatient Encounter Prescriptions as of 03/06/2013  Medication Sig  . amLODipine (NORVASC) 5 MG tablet TAKE 1 TABLET DAILY  . brimonidine-timolol (COMBIGAN) 0.2-0.5 % ophthalmic solution Place 1 drop into both eyes every 12 (twelve) hours.    . budesonide (ENTOCORT EC) 3 MG 24 hr capsule Take 1 capsule (3 mg total) by mouth daily.  . Calcium Carbonate-Vit D-Min 1200-1000 MG-UNIT CHEW Chew by mouth daily.    . clidinium-chlordiazePOXIDE (LIBRAX) 2.5-5 MG per capsule Take 1 capsule by mouth 3 (three) times daily as needed. For Loose stools or abdominal cramping   . diphenoxylate-atropine (LOMOTIL) 2.5-0.025 MG per tablet Take 1 tablet by mouth 4 (four) times daily as needed. For loose stools  . estradiol (ESTRACE VAGINAL) 0.1 MG/GM vaginal cream Place 7.67 Applicatorfuls vaginally daily. Insert one applicator vaginally as directed  . metoprolol succinate (TOPROL-XL) 100 MG 24 hr tablet TAKE 1 TABLET DAILY  . valsartan (DIOVAN) 320 MG tablet TAKE 1 TABLET DAILY  . albuterol (PROVENTIL HFA;VENTOLIN HFA) 108 (90 BASE)  MCG/ACT inhaler Inhale 2 puffs into the lungs every 6 (six) hours as needed for wheezing.   BP 114/78  Pulse 87  Temp(Src) 97.9 F (36.6 C) (Oral)  Wt 151 lb (68.493 kg)  SpO2 96%  Review of Systems  Constitutional: Negative for fever, chills, appetite change, fatigue and unexpected weight change.  HENT: Negative for congestion, ear pain, sinus pressure, sore throat, trouble swallowing and voice change.   Eyes: Negative for visual disturbance.  Respiratory: Negative for cough, shortness of breath, wheezing and stridor.   Cardiovascular: Negative for chest pain, palpitations and leg swelling.  Gastrointestinal: Positive for diarrhea. Negative for nausea, vomiting, abdominal pain, constipation, blood in stool, abdominal distention and anal bleeding.  Genitourinary: Negative for dysuria and flank pain.  Musculoskeletal: Negative for arthralgias, gait problem, myalgias and neck pain.  Skin: Negative for color change and rash.  Neurological: Negative for dizziness and headaches.  Hematological: Negative for adenopathy. Does not bruise/bleed easily.  Psychiatric/Behavioral: Negative for suicidal ideas, sleep disturbance and dysphoric mood. The patient is not nervous/anxious.        Objective:   Physical Exam  Constitutional: She is oriented to person, place, and time. She appears well-developed and well-nourished. No distress.  HENT:  Head: Normocephalic and atraumatic.  Right Ear: External ear normal.  Left Ear: External ear normal.  Nose: Nose normal.  Mouth/Throat: Oropharynx is clear and moist. No oropharyngeal exudate.  Eyes: Conjunctivae are normal. Pupils are equal, round, and reactive to light. Right eye exhibits no discharge. Left eye exhibits no discharge. No scleral icterus.  Neck: Normal range of motion. Neck  supple. No tracheal deviation present. No thyromegaly present.  Cardiovascular: Normal rate, regular rhythm, normal heart sounds and intact distal pulses.  Exam reveals  no gallop and no friction rub.   No murmur heard. Pulmonary/Chest: Effort normal and breath sounds normal. No accessory muscle usage. Not tachypneic. No respiratory distress. She has no decreased breath sounds. She has no wheezes. She has no rhonchi. She has no rales. She exhibits no tenderness.  Musculoskeletal: Normal range of motion. She exhibits no edema and no tenderness.  Lymphadenopathy:    She has no cervical adenopathy.  Neurological: She is alert and oriented to person, place, and time. No cranial nerve deficit. She exhibits normal muscle tone. Coordination normal.  Skin: Skin is warm and dry. No rash noted. She is not diaphoretic. No erythema. No pallor.     Psychiatric: She has a normal mood and affect. Her behavior is normal. Judgment and thought content normal.          Assessment & Plan:

## 2013-03-06 NOTE — Assessment & Plan Note (Signed)
Recent worsening of symptoms of chronic diarrhea with use of antibiotics. We discussed potentially getting stool culture and testing for C. difficile symptoms are persistent. She will call if symptoms are persistent. Continue budesonide.

## 2013-03-06 NOTE — Assessment & Plan Note (Signed)
Wound appears to be healing well with no signs of infection on exam today. However, we discussed potential referral to the wound healing Center should symptoms be persistent. She will continue to apply dry gauze over her wound. Followup as needed.

## 2013-03-06 NOTE — Assessment & Plan Note (Signed)
BP Readings from Last 3 Encounters:  03/06/13 114/78  11/22/12 124/68  10/03/12 124/68   Blood pressure well-controlled on current medications. Will check renal function with labs today.

## 2013-03-07 ENCOUNTER — Encounter: Payer: Self-pay | Admitting: *Deleted

## 2013-03-07 NOTE — Addendum Note (Signed)
Addended by: Ronaldo Miyamoto on: 03/07/2013 08:48 AM   Modules accepted: Orders

## 2013-03-15 ENCOUNTER — Telehealth: Payer: Self-pay | Admitting: Internal Medicine

## 2013-03-15 NOTE — Telephone Encounter (Signed)
Relevant patient education assigned to patient using Emmi. ° °

## 2013-03-28 ENCOUNTER — Other Ambulatory Visit: Payer: Self-pay | Admitting: Internal Medicine

## 2013-09-12 ENCOUNTER — Encounter: Payer: Medicare PPO | Admitting: Internal Medicine

## 2013-09-18 ENCOUNTER — Encounter: Payer: Self-pay | Admitting: Internal Medicine

## 2013-09-18 ENCOUNTER — Ambulatory Visit (INDEPENDENT_AMBULATORY_CARE_PROVIDER_SITE_OTHER): Payer: Medicare PPO | Admitting: Internal Medicine

## 2013-09-18 VITALS — BP 142/84 | HR 71 | Temp 98.4°F | Ht 67.5 in | Wt 150.2 lb

## 2013-09-18 DIAGNOSIS — K5289 Other specified noninfective gastroenteritis and colitis: Secondary | ICD-10-CM

## 2013-09-18 DIAGNOSIS — Z1239 Encounter for other screening for malignant neoplasm of breast: Secondary | ICD-10-CM

## 2013-09-18 DIAGNOSIS — K52832 Lymphocytic colitis: Secondary | ICD-10-CM

## 2013-09-18 DIAGNOSIS — I10 Essential (primary) hypertension: Secondary | ICD-10-CM

## 2013-09-18 DIAGNOSIS — Z0000 Encounter for general adult medical examination without abnormal findings: Secondary | ICD-10-CM

## 2013-09-18 DIAGNOSIS — Z780 Asymptomatic menopausal state: Secondary | ICD-10-CM

## 2013-09-18 MED ORDER — AMLODIPINE BESYLATE 5 MG PO TABS: ORAL_TABLET | ORAL | Status: DC

## 2013-09-18 MED ORDER — DIPHENOXYLATE-ATROPINE 2.5-0.025 MG PO TABS: 1.0000 | ORAL_TABLET | Freq: Four times a day (QID) | ORAL | Status: DC | PRN

## 2013-09-18 MED ORDER — METOPROLOL SUCCINATE ER 100 MG PO TB24: ORAL_TABLET | ORAL | Status: DC

## 2013-09-18 MED ORDER — CILIDINIUM-CHLORDIAZEPOXIDE 2.5-5 MG PO CAPS: 1.0000 | ORAL_CAPSULE | Freq: Three times a day (TID) | ORAL | Status: DC | PRN

## 2013-09-18 MED ORDER — VALSARTAN 320 MG PO TABS: ORAL_TABLET | ORAL | Status: DC

## 2013-09-18 MED ORDER — BUDESONIDE 3 MG PO CP24: 3.0000 mg | ORAL_CAPSULE | Freq: Every day | ORAL | Status: DC

## 2013-09-18 NOTE — Assessment & Plan Note (Signed)
General exam including breast exam normal today. PAP deferred because of age and previous normal PAP in 2012. Health maintenance is UTD. Reviewed recent labs including CBC, CMP, lipids which were stable. Encouraged healthy diet and regular physical activity. Mammogram and bone density testing ordered. Immunizations UTD except for Zostavax, which pt declines because of cost. Follow up 6 months and prn.

## 2013-09-18 NOTE — Progress Notes (Signed)
Pre visit review using our clinic review tool, if applicable. No additional management support is needed unless otherwise documented below in the visit note. 

## 2013-09-18 NOTE — Progress Notes (Signed)
Subjective:    [name]: Mia Perez, female    DOB: November 08, 1933, 80 y.o.   MRN: [id number]  HPI The patient is here for annual Medicare wellness examination and management of other chronic and acute problems.   The risk factors are reflected in the social history.  The roster of all physicians providing medical care to patient - is listed in the Snapshot section of the chart.  Activities of daily living:  The patient is 100% independent in all ADLs: dressing, toileting, feeding as well as independent mobility. Husband recently passed away. Lives alone in a 3 story home, no pets.  Home safety : The patient has smoke detectors in the home. They wear seatbelts.  There are firearms at home. There is no violence in the home.   There is no risks for hepatitis, STDs or HIV. There is no history of blood transfusion. They have no travel history to infectious disease endemic areas of the world.  The patient has seen their dentist in the last six month (Dr.Petersen). They have seen their eye doctor in the last year (Dr. Johny Shock). No current issues with hearing. They have deferred audiologic testing in the last year.    They do not  have excessive sun exposure. Discussed the need for sun protection: hats, long sleeves and use of sunscreen if there is significant sun exposure. Dermatologist - Dr. Kellie Moor.  Diet: the importance of a healthy diet is discussed. They do have a healthy diet.  The benefits of regular aerobic exercise were discussed. She exercises at church 3-4 days per week and goes to [employer].  Depression screen: there are no signs or vegative symptoms of depression- irritability, change in appetite, anhedonia, sadness/tearfullness.  Cognitive assessment: the patient manages all their financial and personal affairs and is actively engaged. They could relate day,date,year and events.  The following portions of the patient's history were reviewed and updated as appropriate:  allergies, current medications, past family history, past medical history,  past surgical history, past social history  and problem list.  Visual acuity was not assessed per patient preference since she has regular follow up with her ophthalmologist. Hearing and body mass index were assessed and reviewed.   During the course of the visit the patient was educated and counseled about appropriate screening and preventive services including : fall prevention , diabetes screening, nutrition counseling, colorectal cancer screening, and recommended immunizations.    In regards to chronic health issues, pt has h/o lymphocytic colitis. She notes that symptoms of diarrhea have been well controlled with the use of budesonide. Symptoms are better controlled with Budesonide 2 pills daily, however she only takes 1 pill daily because of glaucoma and risk of worsening intraocular pressure. She takes 2 pills of Budesonide when she is traveling. She is planning to taper to Budesonide 1 every other day.  In regards to hypertension, she does not regularly check her blood pressure at home. She does report full compliance with her medications. She denies any headache, palpitations, chest pain.  Recently had blue light therapy to face with Dr. Kellie Moor. Having some persistent redness on her cheeks.  Review of Systems  Constitutional: Negative for fever, chills, appetite change, fatigue and unexpected weight change.  Eyes: Negative for visual disturbance.  Respiratory: Negative for shortness of breath.   Cardiovascular: Negative for chest pain and leg swelling.  Gastrointestinal: Positive for diarrhea. Negative for nausea, vomiting, abdominal pain and constipation.  Musculoskeletal: Negative for arthralgias, back pain and myalgias.  Skin:  Positive for color change (redness over face) and rash.  Neurological: Negative for weakness.  Hematological: Negative for adenopathy. Does not bruise/bleed easily.    Psychiatric/Behavioral: Negative for dysphoric mood. The patient is not nervous/anxious.        Objective:    BP 142/84  Pulse 71  Temp(Src) 98.4 F (36.9 C) (Oral)  Ht 5' 7.5" (1.715 m)  Wt 150 lb 4 oz (68.153 kg)  BMI 23.17 kg/m2  SpO2 94% Physical Exam  Constitutional: She is oriented to person, place, and time. She appears well-developed and well-nourished. No distress.  HENT:  Head: Normocephalic and atraumatic.  Right Ear: External ear normal.  Left Ear: External ear normal.  Nose: Nose normal.  Mouth/Throat: Oropharynx is clear and moist. No oropharyngeal exudate.  Eyes: Conjunctivae are normal. Pupils are equal, round, and reactive to light. Right eye exhibits no discharge. Left eye exhibits no discharge. No scleral icterus.  Neck: Normal range of motion. Neck supple. No tracheal deviation present. No thyromegaly present.  Cardiovascular: Normal rate, regular rhythm, normal heart sounds and intact distal pulses.  Exam reveals no gallop and no friction rub.   No murmur heard. Pulmonary/Chest: Effort normal and breath sounds normal. No accessory muscle usage. Not tachypneic. No respiratory distress. She has no decreased breath sounds. She has no wheezes. She has no rhonchi. She has no rales. She exhibits no tenderness. Right breast exhibits no inverted nipple, no mass, no nipple discharge, no skin change and no tenderness. Left breast exhibits no inverted nipple, no mass, no nipple discharge, no skin change and no tenderness. Breasts are symmetrical.  Abdominal: Soft. Bowel sounds are normal. She exhibits no distension and no mass. There is no tenderness. There is no rebound and no guarding.  Musculoskeletal: Normal range of motion. She exhibits no edema and no tenderness.  Lymphadenopathy:    She has no cervical adenopathy.  Neurological: She is alert and oriented to person, place, and time. No cranial nerve deficit. She exhibits normal muscle tone. Coordination normal.   Skin: Skin is warm and dry. No rash noted. She is not diaphoretic. No erythema. No pallor.  Psychiatric: She has a normal mood and affect. Her behavior is normal. Judgment and thought content normal.          Assessment & Plan:   Problem List Items Addressed This Visit     Unprioritized   Hypertension   Relevant Medications      metoprolol succinate (TOPROL-XL) 24 hr tablet      amLODIpine (NORVASC) tablet      valsartan (DIOVAN) tablet   Lymphocytic colitis   Relevant Medications      budesonide (ENTOCORT EC) 3 MG 24 hr capsule   Medicare annual wellness visit, subsequent - Primary     General exam including breast exam normal today. PAP deferred because of age and previous normal PAP in 2012. Health maintenance is UTD. Reviewed recent labs including CBC, CMP, lipids which were stable. Encouraged healthy diet and regular physical activity. Mammogram and bone density testing ordered. Immunizations UTD except for Zostavax, which pt declines because of cost. Follow up 6 months and prn.      Relevant Orders      CBC with Differential      Comprehensive metabolic panel      Lipid panel      Microalbumin / creatinine urine ratio    Other Visit Diagnoses   Screening for breast cancer        Relevant Orders  MM Digital Screening    Postmenopausal estrogen deficiency        Relevant Orders       DG Bone Density        Return for Recheck.

## 2013-09-18 NOTE — Patient Instructions (Signed)

## 2013-09-19 ENCOUNTER — Encounter: Payer: Self-pay | Admitting: *Deleted

## 2013-09-19 ENCOUNTER — Telehealth: Payer: Self-pay | Admitting: Internal Medicine

## 2013-09-19 ENCOUNTER — Other Ambulatory Visit (INDEPENDENT_AMBULATORY_CARE_PROVIDER_SITE_OTHER): Payer: Medicare PPO

## 2013-09-19 DIAGNOSIS — I10 Essential (primary) hypertension: Secondary | ICD-10-CM

## 2013-09-19 DIAGNOSIS — Z0000 Encounter for general adult medical examination without abnormal findings: Secondary | ICD-10-CM

## 2013-09-19 LAB — CBC WITH DIFFERENTIAL/PLATELET
BASOS PCT: 0.4 % (ref 0.0–3.0)
Basophils Absolute: 0 10*3/uL (ref 0.0–0.1)
Eosinophils Absolute: 0.2 10*3/uL (ref 0.0–0.7)
Eosinophils Relative: 2.2 % (ref 0.0–5.0)
HEMATOCRIT: 39.3 % (ref 36.0–46.0)
HEMOGLOBIN: 13 g/dL (ref 12.0–15.0)
LYMPHS ABS: 2.1 10*3/uL (ref 0.7–4.0)
Lymphocytes Relative: 23.9 % (ref 12.0–46.0)
MCHC: 33.1 g/dL (ref 30.0–36.0)
MCV: 91.6 fl (ref 78.0–100.0)
MONO ABS: 0.5 10*3/uL (ref 0.1–1.0)
MONOS PCT: 5.7 % (ref 3.0–12.0)
NEUTROS ABS: 6.1 10*3/uL (ref 1.4–7.7)
Neutrophils Relative %: 67.8 % (ref 43.0–77.0)
PLATELETS: 294 10*3/uL (ref 150.0–400.0)
RBC: 4.29 Mil/uL (ref 3.87–5.11)
RDW: 14.7 % (ref 11.5–15.5)
WBC: 9 10*3/uL (ref 4.0–10.5)

## 2013-09-19 LAB — LIPID PANEL
Cholesterol: 203 mg/dL — ABNORMAL HIGH (ref 0–200)
HDL: 43.4 mg/dL (ref >39.00)
LDL CALC: 130 mg/dL — AB (ref 0–99)
NonHDL: 159.6
Total CHOL/HDL Ratio: 5
Triglycerides: 146 mg/dL (ref 0.0–149.0)
VLDL: 29.2 mg/dL (ref 0.0–40.0)

## 2013-09-19 LAB — COMPREHENSIVE METABOLIC PANEL
ALBUMIN: 3.5 g/dL (ref 3.5–5.2)
ALK PHOS: 67 U/L (ref 39–117)
ALT: 14 U/L (ref 0–35)
AST: 13 U/L (ref 0–37)
BUN: 16 mg/dL (ref 6–23)
CO2: 26 mEq/L (ref 19–32)
Calcium: 9.2 mg/dL (ref 8.4–10.5)
Chloride: 108 mEq/L (ref 96–112)
Creatinine, Ser: 1 mg/dL (ref 0.4–1.2)
GFR: 56.4 mL/min — ABNORMAL LOW (ref >60.00)
Glucose, Bld: 81 mg/dL (ref 70–99)
POTASSIUM: 3.5 meq/L (ref 3.5–5.1)
SODIUM: 142 meq/L (ref 135–145)
TOTAL PROTEIN: 6.8 g/dL (ref 6.0–8.3)
Total Bilirubin: 0.4 mg/dL (ref 0.2–1.2)

## 2013-09-19 LAB — MICROALBUMIN / CREATININE URINE RATIO
Creatinine,U: 76.7 mg/dL
MICROALB/CREAT RATIO: 1 mg/g (ref 0.0–30.0)
Microalb, Ur: 0.8 mg/dL (ref 0.0–1.9)

## 2013-09-19 NOTE — Telephone Encounter (Signed)
Pt requested to have lab results mailed to her instead of sent through my chart.

## 2013-10-26 ENCOUNTER — Encounter: Payer: Self-pay | Admitting: *Deleted

## 2013-10-26 ENCOUNTER — Ambulatory Visit: Payer: Self-pay | Admitting: Internal Medicine

## 2013-10-26 LAB — HM DEXA SCAN

## 2013-10-26 LAB — HM MAMMOGRAPHY: HM Mammogram: NEGATIVE

## 2013-10-27 ENCOUNTER — Telehealth: Payer: Self-pay | Admitting: Internal Medicine

## 2013-10-27 NOTE — Telephone Encounter (Signed)
Recent bone density testing showed T-score of -1.1. We should set up an appointment to discuss treatment.

## 2013-10-30 NOTE — Telephone Encounter (Signed)
Left vm requesting pt to return my call 

## 2013-11-02 NOTE — Telephone Encounter (Signed)
Left message, notifying of results. Requested call back to schedule appointment

## 2013-12-12 ENCOUNTER — Encounter: Payer: Self-pay | Admitting: Internal Medicine

## 2013-12-12 ENCOUNTER — Ambulatory Visit (INDEPENDENT_AMBULATORY_CARE_PROVIDER_SITE_OTHER): Payer: Medicare PPO | Admitting: Internal Medicine

## 2013-12-12 VITALS — BP 124/72 | HR 70 | Temp 98.1°F | Ht 67.5 in | Wt 149.2 lb

## 2013-12-12 DIAGNOSIS — K52832 Lymphocytic colitis: Secondary | ICD-10-CM

## 2013-12-12 DIAGNOSIS — M8580 Other specified disorders of bone density and structure, unspecified site: Secondary | ICD-10-CM | POA: Insufficient documentation

## 2013-12-12 DIAGNOSIS — K5289 Other specified noninfective gastroenteritis and colitis: Secondary | ICD-10-CM

## 2013-12-12 DIAGNOSIS — Z23 Encounter for immunization: Secondary | ICD-10-CM

## 2013-12-12 MED ORDER — CILIDINIUM-CHLORDIAZEPOXIDE 2.5-5 MG PO CAPS: 1.0000 | ORAL_CAPSULE | Freq: Every day | ORAL | Status: DC | PRN

## 2013-12-12 NOTE — Progress Notes (Signed)
Subjective:    [name]: Mia Perez, female    DOB: 08-27-1933, 80 y.o.   MRN: [id number]  HPI 79YO female presents for follow up of bone density testing.  Osteoporosis - T-score -1.1 She has never taken medications for osteopenia. She takes a calcium and Vit D supplement . She exercises in a program at her church. No previous fracture.  She continues to have intermittent diarrhea. This is well controlled generally with Entocort. She questions whether she can stop her Librax, however in the past she had severe abdominal cramping without the medication.  Review of Systems  Constitutional: Negative for fever, chills, appetite change, fatigue and unexpected weight change.  Eyes: Negative for visual disturbance.  Respiratory: Negative for shortness of breath.   Cardiovascular: Negative for chest pain, palpitations and leg swelling.  Gastrointestinal: Positive for abdominal pain (intermittent cramping with chronic diarrhea) and diarrhea. Negative for nausea, vomiting and constipation.  Musculoskeletal: Negative for myalgias and arthralgias.  Skin: Negative for color change and rash.  Hematological: Negative for adenopathy. Does not bruise/bleed easily.  Psychiatric/Behavioral: Negative for dysphoric mood. The patient is not nervous/anxious.        Objective:    BP 124/72 mmHg  Pulse 70  Temp(Src) 98.1 F (36.7 C) (Oral)  Ht 5' 7.5" (1.715 m)  Wt 149 lb 4 oz (67.699 kg)  BMI 23.02 kg/m2  SpO2 97% Physical Exam  Constitutional: She is oriented to person, place, and time. She appears well-developed and well-nourished. No distress.  HENT:  Head: Normocephalic and atraumatic.  Right Ear: External ear normal.  Left Ear: External ear normal.  Nose: Nose normal.  Mouth/Throat: Oropharynx is clear and moist. No oropharyngeal exudate.  Eyes: Conjunctivae are normal. Pupils are equal, round, and reactive to light. Right eye exhibits no discharge. Left eye exhibits no discharge. No  scleral icterus.  Neck: Normal range of motion. Neck supple. No tracheal deviation present. No thyromegaly present.  Cardiovascular: Normal rate, regular rhythm, normal heart sounds and intact distal pulses.  Exam reveals no gallop and no friction rub.   No murmur heard. Pulmonary/Chest: Effort normal and breath sounds normal. No accessory muscle usage. No tachypnea. No respiratory distress. She has no decreased breath sounds. She has no wheezes. She has no rhonchi. She has no rales. She exhibits no tenderness.  Abdominal: Soft. Bowel sounds are normal. She exhibits no distension. There is no tenderness.  Musculoskeletal: Normal range of motion. She exhibits no edema or tenderness.  Lymphadenopathy:    She has no cervical adenopathy.  Neurological: She is alert and oriented to person, place, and time. No cranial nerve deficit. She exhibits normal muscle tone. Coordination normal.  Skin: Skin is warm and dry. No rash noted. She is not diaphoretic. No erythema. No pallor.  Psychiatric: She has a normal mood and affect. Her behavior is normal. Judgment and thought content normal.          Assessment & Plan:   Problem List Items Addressed This Visit      Unprioritized   Lymphocytic colitis    Symptoms generally have been well controlled with Budesonide and prn Lomotil and Librax. Encouraged her to continue these medications.    Osteopenia - Primary    We reviewed her bone density testing. Discussed potential treatments including bisphosphonate's. Given her ongoing GI issues she is reluctant to start medication. We discussed Vit D supplementation, adequate dietary calcium and weight bearing exercise. Will plan to hold off on medication for now. Repeat  bone density testing in 2017.     Other Visit Diagnoses    Need for influenza vaccination        Relevant Orders       Flu Vaccine QUAD 36+ mos PF IM (Fluarix Quad PF) (Completed)        Return in about 3 months (around 03/14/2014) for  Recheck.

## 2013-12-12 NOTE — Patient Instructions (Signed)
Stop your calcium supplement. Get adequate Calcium in your diet.  Take Vit D 2000units daily.  Increase exercise, such as at [employer].  Osteoporosis Throughout your life, your body breaks down old bone and replaces it with new bone. As you get older, your body does not replace bone as quickly as it breaks it down. By the age of 70 years, most people begin to gradually lose bone because of the imbalance between bone loss and replacement. Some people lose more bone than others. Bone loss beyond a specified normal degree is considered osteoporosis.  Osteoporosis affects the strength and durability of your bones. The inside of the ends of your bones and your flat bones, like the bones of your pelvis, look like honeycomb, filled with tiny open spaces. As bone loss occurs, your bones become less dense. This means that the open spaces inside your bones become bigger and the walls between these spaces become thinner. This makes your bones weaker. Bones of a person with osteoporosis can become so weak that they can break (fracture) during minor accidents, such as a simple fall. CAUSES  The following factors have been associated with the development of osteoporosis:  Smoking.  Drinking more than 2 alcoholic drinks several days per week.  Long-term use of certain medicines:  Corticosteroids.  Chemotherapy medicines.  Thyroid medicines.  Antiepileptic medicines.  Gonadal hormone suppression medicine.  Immunosuppression medicine.  Being underweight.  Lack of physical activity.  Lack of exposure to the sun. This can lead to vitamin D deficiency.  Certain medical conditions:  Certain inflammatory bowel diseases, such as Crohn disease and ulcerative colitis.  Diabetes.  Hyperthyroidism.  Hyperparathyroidism. RISK FACTORS Anyone can develop osteoporosis. However, the following factors can increase your risk of developing osteoporosis:  Gender--Women are at higher risk than  men.  Age--Being older than 50 years increases your risk.  Ethnicity--White and Asian people have an increased risk.  Weight --Being extremely underweight can increase your risk of osteoporosis.  Family history of osteoporosis--Having a family member who has developed osteoporosis can increase your risk. SYMPTOMS  Usually, people with osteoporosis have no symptoms.  DIAGNOSIS  Signs during a physical exam that may prompt your caregiver to suspect osteoporosis include:  Decreased height. This is usually caused by the compression of the bones that form your spine (vertebrae) because they have weakened and become fractured.  A curving or rounding of the upper back (kyphosis). To confirm signs of osteoporosis, your caregiver may request a procedure that uses 2 low-dose X-ray beams with different levels of energy to measure your bone mineral density (dual-energy X-ray absorptiometry [DXA]). Also, your caregiver may check your level of vitamin D. TREATMENT  The goal of osteoporosis treatment is to strengthen bones in order to decrease the risk of bone fractures. There are different types of medicines available to help achieve this goal. Some of these medicines work by slowing the processes of bone loss. Some medicines work by increasing bone density. Treatment also involves making sure that your levels of calcium and vitamin D are adequate. PREVENTION  There are things you can do to help prevent osteoporosis. Adequate intake of calcium and vitamin D can help you achieve optimal bone mineral density. Regular exercise can also help, especially resistance and weight-bearing activities. If you smoke, quitting smoking is an important part of osteoporosis prevention. MAKE SURE YOU:  Understand these instructions.  Will watch your condition.  Will get help right away if you are not doing well or get worse. FOR  MORE INFORMATION www.osteo.org and [employer].com.ee Document Released: 11/05/2004 Document  Revised: 05/23/2012 Document Reviewed: 01/10/2011 [facility] Patient Information 2015 [place], [place]. This information is not intended to replace advice given to you by your health care provider. Make sure you discuss any questions you have with your health care provider.

## 2013-12-12 NOTE — Assessment & Plan Note (Signed)
We reviewed her bone density testing. Discussed potential treatments including bisphosphonate's. Given her ongoing GI issues she is reluctant to start medication. We discussed Vit D supplementation, adequate dietary calcium and weight bearing exercise. Will plan to hold off on medication for now. Repeat bone density testing in 2017.

## 2013-12-12 NOTE — Assessment & Plan Note (Signed)
Symptoms generally have been well controlled with Budesonide and prn Lomotil and Librax. Encouraged her to continue these medications.

## 2013-12-12 NOTE — Progress Notes (Signed)
Pre visit review using our clinic review tool, if applicable. No additional management support is needed unless otherwise documented below in the visit note. 

## 2014-02-07 ENCOUNTER — Other Ambulatory Visit: Payer: Self-pay | Admitting: Internal Medicine

## 2014-02-07 NOTE — Telephone Encounter (Addendum)
Last OV 8.10.15, last refill 9.23.15.  Please advise refill

## 2014-03-14 ENCOUNTER — Ambulatory Visit (INDEPENDENT_AMBULATORY_CARE_PROVIDER_SITE_OTHER): Payer: Medicare PPO | Admitting: Internal Medicine

## 2014-03-14 ENCOUNTER — Encounter: Payer: Self-pay | Admitting: Internal Medicine

## 2014-03-14 VITALS — BP 132/78 | HR 72 | Temp 97.9°F | Ht 67.5 in | Wt 151.5 lb

## 2014-03-14 DIAGNOSIS — K5289 Other specified noninfective gastroenteritis and colitis: Secondary | ICD-10-CM

## 2014-03-14 DIAGNOSIS — I10 Essential (primary) hypertension: Secondary | ICD-10-CM

## 2014-03-14 DIAGNOSIS — K52832 Lymphocytic colitis: Secondary | ICD-10-CM

## 2014-03-14 LAB — COMPREHENSIVE METABOLIC PANEL
ALT: 12 U/L (ref 0–35)
AST: 14 U/L (ref 0–37)
Albumin: 3.9 g/dL (ref 3.5–5.2)
Alkaline Phosphatase: 85 U/L (ref 39–117)
BILIRUBIN TOTAL: 0.4 mg/dL (ref 0.2–1.2)
BUN: 25 mg/dL — AB (ref 6–23)
CALCIUM: 9.6 mg/dL (ref 8.4–10.5)
CHLORIDE: 105 meq/L (ref 96–112)
CO2: 29 meq/L (ref 19–32)
Creatinine, Ser: 1.07 mg/dL (ref 0.40–1.20)
GFR: 52.7 mL/min — ABNORMAL LOW (ref >60.00)
GLUCOSE: 86 mg/dL (ref 70–99)
Potassium: 4.2 mEq/L (ref 3.5–5.1)
Sodium: 138 mEq/L (ref 135–145)
Total Protein: 6.9 g/dL (ref 6.0–8.3)

## 2014-03-14 NOTE — Progress Notes (Signed)
Pre visit review using our clinic review tool, if applicable. No additional management support is needed unless otherwise documented below in the visit note. 

## 2014-03-14 NOTE — Assessment & Plan Note (Signed)
Symptoms well controlled with Budesonide and prn Lomotil.  Will continue.

## 2014-03-14 NOTE — Assessment & Plan Note (Signed)
BP Readings from Last 3 Encounters:  03/14/14 132/78  12/12/13 124/72  09/18/13 142/84   BP well controlled on current medication. Renal function with labs today.

## 2014-03-14 NOTE — Patient Instructions (Signed)
Labs today.  Follow up in 6 months for Wellness Visit.

## 2014-03-14 NOTE — Progress Notes (Signed)
   Subjective:    [name]: Mia Perez, female    DOB: 08-30-1933, 81 y.o.   MRN: [id number]  HPI  81YO female presents for follow up.  Had some trouble with sciatic pain after lifting things over the Holidays. Did not seek care for this. Pain has resolved.  Diarrhea - symptoms have been pretty well controlled with medication. Has been tolerating food well recently.  HTN - Compliant with medication. No chest pain, palpitations.  Past medical, surgical, family and social history per today's encounter.  Review of Systems  Constitutional: Negative for fever, chills, appetite change, fatigue and unexpected weight change.  Eyes: Negative for visual disturbance.  Respiratory: Negative for shortness of breath.   Cardiovascular: Negative for chest pain, palpitations and leg swelling.  Gastrointestinal: Negative for nausea, vomiting, abdominal pain, diarrhea and constipation.  Musculoskeletal: Negative for myalgias and arthralgias.  Skin: Negative for color change and rash.  Hematological: Negative for adenopathy. Does not bruise/bleed easily.  Psychiatric/Behavioral: Negative for sleep disturbance and dysphoric mood. The patient is not nervous/anxious.        Objective:    BP 132/78 mmHg  Pulse 72  Temp(Src) 97.9 F (36.6 C) (Oral)  Ht 5' 7.5" (1.715 m)  Wt 151 lb 8 oz (68.72 kg)  BMI 23.36 kg/m2  SpO2 95% Physical Exam  Constitutional: She is oriented to person, place, and time. She appears well-developed and well-nourished. No distress.  HENT:  Head: Normocephalic and atraumatic.  Right Ear: External ear normal.  Left Ear: External ear normal.  Nose: Nose normal.  Mouth/Throat: Oropharynx is clear and moist. No oropharyngeal exudate.  Eyes: Conjunctivae are normal. Pupils are equal, round, and reactive to light. Right eye exhibits no discharge. Left eye exhibits no discharge. No scleral icterus.  Neck: Normal range of motion. Neck supple. No tracheal deviation  present. No thyromegaly present.  Cardiovascular: Normal rate, regular rhythm, normal heart sounds and intact distal pulses.  Exam reveals no gallop and no friction rub.   No murmur heard. Pulmonary/Chest: Effort normal and breath sounds normal. No respiratory distress. She has no wheezes. She has no rales. She exhibits no tenderness.  Musculoskeletal: Normal range of motion. She exhibits no edema or tenderness.  Lymphadenopathy:    She has no cervical adenopathy.  Neurological: She is alert and oriented to person, place, and time. No cranial nerve deficit. She exhibits normal muscle tone. Coordination normal.  Skin: Skin is warm and dry. No rash noted. She is not diaphoretic. No erythema. No pallor.  Psychiatric: She has a normal mood and affect. Her behavior is normal. Judgment and thought content normal.          Assessment & Plan:  Over 36min of which >50% spent in face-to-face contact with patient discussing plan of care  Problem List Items Addressed This Visit      Unprioritized   Hypertension - Primary    BP Readings from Last 3 Encounters:  03/14/14 132/78  12/12/13 124/72  09/18/13 142/84   BP well controlled on current medication. Renal function with labs today.      Relevant Orders   Comprehensive metabolic panel   Lymphocytic colitis    Symptoms well controlled with Budesonide and prn Lomotil.  Will continue.          Return in about 6 months (around 09/12/2014) for Wellness Visit.

## 2014-03-15 ENCOUNTER — Encounter: Payer: Self-pay | Admitting: *Deleted

## 2014-03-15 ENCOUNTER — Telehealth: Payer: Self-pay | Admitting: Internal Medicine

## 2014-03-15 NOTE — Telephone Encounter (Signed)
emmi emailed °

## 2014-03-20 ENCOUNTER — Ambulatory Visit: Payer: Medicare PPO | Admitting: Internal Medicine

## 2014-04-02 ENCOUNTER — Other Ambulatory Visit: Payer: Self-pay | Admitting: Internal Medicine

## 2014-05-11 DIAGNOSIS — L728 Other follicular cysts of the skin and subcutaneous tissue: Secondary | ICD-10-CM | POA: Diagnosis not present

## 2014-06-01 NOTE — Op Note (Signed)
PATIENT NAME:  Mia Perez, Mia Perez MR#:  [id number] DATE OF BIRTH:  April 14, 1933  DATE OF PROCEDURE:  11/17/2012  PREOPERATIVE DIAGNOSIS: Right breast microcalcifications.   POSTOPERATIVE DIAGNOSIS: Right breast microcalcifications.  OPERATIVE PROCEDURE: Excision right breast with wire localization.   SURGEON: Hervey Ard, MD.   ANESTHESIA: General by LMA, Marcaine 0.5% with 1:200,000 units epinephrine, 30 mL.   ESTIMATED BLOOD LOSS: Minimal.   CLINICAL NOTE: This 81 year old woman has had an insignificant interval and mammogram studies and the new studies showed an area of microcalcifications in the right breast. Attempt at stereotactic biopsy last week was unsuccessful in localizing the area. She underwent wire localization this morning.   OPERATIVE NOTE: With the patient under adequate general anesthesia, the breast was carefully prepped with ChloraPrep and draped. Ultrasound was used to confirm the course of the localizing wire. The radiologist notes of the location of concern being 2 cm from the entry site was marked on the skin and a skin incision made along the course of the wire. Marcaine was infiltrated for postoperative analgesia. The initial block of tissue was excised, orientated and sent for specimen radiograph that did not confirm the microcalcifications. The tissue from the 2 cm mark back to the needle entry site was then excised. The resection, both times, was from the subcutaneous fat to and including the pectoralis fascia. The re-orientated second specimen was sent and specimen radiograph confirmed and intact wire and the index microcalcifications. A single clip was placed at the location of the biopsy site. The wound was closed in layers after mobilizing the breast off the underlying pectoralis muscle. The deep tissue was approximated with interrupted 2-0 Vicryl figure-of-eight sutures. The adipose layer was approximated in a similar fashion. The skin was closed with a running 4-0  Vicryl subcuticular suture. Benzoin, Steri-Strips, Telfa, and Tegaderm dressing was then applied. The patient tolerated the procedure well and was taken to the recovery room in stable condition.  ____________________________ Robert Bellow, MD jwb:aw D: 11/18/2012 07:06:16 ET T: 11/18/2012 07:18:22 ET JOB#: [id number]  cc: Robert Bellow, MD, <Dictator> Eduard Clos. Gilford Rile, MD JEFFREY Amedeo Kinsman MD ELECTRONICALLY SIGNED 11/18/2012 8:38

## 2014-06-08 DIAGNOSIS — H4011X1 Primary open-angle glaucoma, mild stage: Secondary | ICD-10-CM | POA: Diagnosis not present

## 2014-06-09 ENCOUNTER — Other Ambulatory Visit: Payer: Self-pay | Admitting: Internal Medicine

## 2014-06-11 NOTE — Telephone Encounter (Signed)
Ok refill? 

## 2014-07-13 DIAGNOSIS — H4011X1 Primary open-angle glaucoma, mild stage: Secondary | ICD-10-CM | POA: Diagnosis not present

## 2014-09-14 ENCOUNTER — Encounter: Payer: Self-pay | Admitting: Internal Medicine

## 2014-09-14 ENCOUNTER — Ambulatory Visit (INDEPENDENT_AMBULATORY_CARE_PROVIDER_SITE_OTHER): Payer: Medicare PPO | Admitting: Internal Medicine

## 2014-09-14 VITALS — BP 107/67 | HR 79 | Temp 97.7°F | Ht 67.5 in | Wt 149.0 lb

## 2014-09-14 DIAGNOSIS — Z79899 Other long term (current) drug therapy: Secondary | ICD-10-CM

## 2014-09-14 DIAGNOSIS — R5382 Chronic fatigue, unspecified: Secondary | ICD-10-CM

## 2014-09-14 DIAGNOSIS — R5383 Other fatigue: Secondary | ICD-10-CM | POA: Insufficient documentation

## 2014-09-14 DIAGNOSIS — R630 Anorexia: Secondary | ICD-10-CM

## 2014-09-14 LAB — COMPREHENSIVE METABOLIC PANEL
ALBUMIN: 3.7 g/dL (ref 3.5–5.2)
ALK PHOS: 80 U/L (ref 39–117)
ALT: 12 U/L (ref 0–35)
AST: 12 U/L (ref 0–37)
BUN: 31 mg/dL — ABNORMAL HIGH (ref 6–23)
CALCIUM: 9.5 mg/dL (ref 8.4–10.5)
CO2: 24 mEq/L (ref 19–32)
CREATININE: 1.13 mg/dL (ref 0.40–1.20)
Chloride: 107 mEq/L (ref 96–112)
GFR: 49.42 mL/min — ABNORMAL LOW (ref >60.00)
Glucose, Bld: 105 mg/dL — ABNORMAL HIGH (ref 70–99)
Potassium: 4.4 mEq/L (ref 3.5–5.1)
Sodium: 139 mEq/L (ref 135–145)
Total Bilirubin: 0.3 mg/dL (ref 0.2–1.2)
Total Protein: 6.9 g/dL (ref 6.0–8.3)

## 2014-09-14 LAB — CBC WITH DIFFERENTIAL/PLATELET
Basophils Absolute: 0 10*3/uL (ref 0.0–0.1)
Basophils Relative: 0.4 % (ref 0.0–3.0)
EOS ABS: 0.3 10*3/uL (ref 0.0–0.7)
Eosinophils Relative: 3.3 % (ref 0.0–5.0)
HCT: 38.2 % (ref 36.0–46.0)
Hemoglobin: 12.8 g/dL (ref 12.0–15.0)
LYMPHS PCT: 14 % (ref 12.0–46.0)
Lymphs Abs: 1.2 10*3/uL (ref 0.7–4.0)
MCHC: 33.6 g/dL (ref 30.0–36.0)
MCV: 89.7 fl (ref 78.0–100.0)
MONO ABS: 0.6 10*3/uL (ref 0.1–1.0)
MONOS PCT: 6.4 % (ref 3.0–12.0)
NEUTROS PCT: 75.9 % (ref 43.0–77.0)
Neutro Abs: 6.7 10*3/uL (ref 1.4–7.7)
Platelets: 453 10*3/uL — ABNORMAL HIGH (ref 150.0–400.0)
RBC: 4.25 Mil/uL (ref 3.87–5.11)
RDW: 14.5 % (ref 11.5–15.5)
WBC: 8.8 10*3/uL (ref 4.0–10.5)

## 2014-09-14 LAB — TSH: TSH: 2.31 u[IU]/mL (ref 0.35–4.50)

## 2014-09-14 LAB — VITAMIN B12: Vitamin B-12: 358 pg/mL (ref 211–911)

## 2014-09-14 NOTE — Progress Notes (Signed)
Subjective:    [name]: Mia Perez, female    DOB: 06-16-1933, 81 y.o.   MRN: [id number]  HPI  81YO female presents for acute visit.  Feeling more fatigued over last few months. Not eating as well. Poor appetite. Trying to supplement with Ensure. Eating some soup, sandwiches. Feels generally weak. No nausea or vomiting. No change in bowel habits. Has daily BM, but no diarrhea. No blood in stool or black stool. No chest pain, dyspnea. About 3 weeks ago, did prolonged yard work and felt extremely tired afterward.  Also recently went on a 14 day bus tour, and thinks this was exhausting.  Wt Readings from Last 3 Encounters:  09/14/14 149 lb (67.586 kg)  03/14/14 151 lb 8 oz (68.72 kg)  12/12/13 149 lb 4 oz (67.699 kg)     Past medical, surgical, family and social history per today's encounter.   Review of Systems  Constitutional: Positive for appetite change and fatigue. Negative for fever, chills and unexpected weight change.  Eyes: Negative for visual disturbance.  Respiratory: Negative for shortness of breath.   Cardiovascular: Negative for chest pain and leg swelling.  Gastrointestinal: Negative for nausea, vomiting, abdominal pain, diarrhea, constipation and blood in stool.  Musculoskeletal: Negative for myalgias and arthralgias.  Skin: Negative for color change and rash.  Hematological: Negative for adenopathy. Does not bruise/bleed easily.  Psychiatric/Behavioral: Negative for sleep disturbance and dysphoric mood. The patient is not nervous/anxious.        Objective:    BP 107/67 mmHg  Pulse 79  Temp(Src) 97.7 F (36.5 C) (Oral)  Ht 5' 7.5" (1.715 m)  Wt 149 lb (67.586 kg)  BMI 22.98 kg/m2  SpO2 96% Physical Exam  Constitutional: She is oriented to person, place, and time. She appears well-developed and well-nourished. No distress.  HENT:  Head: Normocephalic and atraumatic.  Right Ear: External ear normal.  Left Ear: External ear normal.  Nose:  Nose normal.  Mouth/Throat: Oropharynx is clear and moist. No oropharyngeal exudate.  Eyes: Conjunctivae and EOM are normal. Pupils are equal, round, and reactive to light. Right eye exhibits no discharge.  Neck: Normal range of motion. Neck supple. No thyromegaly present.  Cardiovascular: Normal rate, regular rhythm, normal heart sounds and intact distal pulses.  Exam reveals no gallop and no friction rub.   No murmur heard. Pulmonary/Chest: Effort normal. No respiratory distress. She has no wheezes. She has no rales.  Abdominal: Soft. Bowel sounds are normal. She exhibits no distension and no mass. There is no tenderness. There is no rebound and no guarding.  Musculoskeletal: Normal range of motion. She exhibits no edema or tenderness.  Lymphadenopathy:    She has no cervical adenopathy.  Neurological: She is alert and oriented to person, place, and time. No cranial nerve deficit. Coordination normal.  Skin: Skin is warm and dry. No rash noted. She is not diaphoretic. No erythema. No pallor.  Psychiatric: She has a normal mood and affect. Her behavior is normal. Judgment and thought content normal.          Assessment & Plan:   Problem List Items Addressed This Visit      Unprioritized   Chronic fatigue - Primary    Non-focal. Exam normal. Will check labs today including CBC, CMP, TSH, B12. Follow up in 4 weeks.      Relevant Orders   Comprehensive metabolic panel   CBC with Differential/Platelet   TSH   B12   Poor appetite  Encouraged adequate hydration and use of supplemental boost. Weight is stable. Will check TSH, CBC, CMP with labs.          Return in about 4 weeks (around 10/12/2014) for Recheck.

## 2014-09-14 NOTE — Assessment & Plan Note (Signed)
Encouraged adequate hydration and use of supplemental boost. Weight is stable. Will check TSH, CBC, CMP with labs.

## 2014-09-14 NOTE — Progress Notes (Signed)
Pre visit review using our clinic review tool, if applicable. No additional management support is needed unless otherwise documented below in the visit note. 

## 2014-09-14 NOTE — Assessment & Plan Note (Signed)
Non-focal. Exam normal. Will check labs today including CBC, CMP, TSH, B12. Follow up in 4 weeks.

## 2014-09-14 NOTE — Patient Instructions (Signed)
We will check labs today to help determine the cause of your fatigue.

## 2014-10-16 ENCOUNTER — Encounter: Payer: Self-pay | Admitting: Internal Medicine

## 2014-10-16 ENCOUNTER — Ambulatory Visit (INDEPENDENT_AMBULATORY_CARE_PROVIDER_SITE_OTHER): Payer: Medicare PPO | Admitting: Internal Medicine

## 2014-10-16 VITALS — BP 121/76 | HR 73 | Temp 98.0°F | Ht 67.5 in | Wt 149.4 lb

## 2014-10-16 DIAGNOSIS — Z23 Encounter for immunization: Secondary | ICD-10-CM

## 2014-10-16 DIAGNOSIS — S81801S Unspecified open wound, right lower leg, sequela: Secondary | ICD-10-CM

## 2014-10-16 DIAGNOSIS — K5289 Other specified noninfective gastroenteritis and colitis: Secondary | ICD-10-CM | POA: Diagnosis not present

## 2014-10-16 DIAGNOSIS — R5382 Chronic fatigue, unspecified: Secondary | ICD-10-CM | POA: Diagnosis not present

## 2014-10-16 DIAGNOSIS — S81001S Unspecified open wound, right knee, sequela: Secondary | ICD-10-CM | POA: Diagnosis not present

## 2014-10-16 DIAGNOSIS — S81009A Unspecified open wound, unspecified knee, initial encounter: Secondary | ICD-10-CM | POA: Insufficient documentation

## 2014-10-16 DIAGNOSIS — K52832 Lymphocytic colitis: Secondary | ICD-10-CM

## 2014-10-16 DIAGNOSIS — S91009A Unspecified open wound, unspecified ankle, initial encounter: Secondary | ICD-10-CM

## 2014-10-16 DIAGNOSIS — Z1239 Encounter for other screening for malignant neoplasm of breast: Secondary | ICD-10-CM

## 2014-10-16 DIAGNOSIS — S81809A Unspecified open wound, unspecified lower leg, initial encounter: Secondary | ICD-10-CM

## 2014-10-16 DIAGNOSIS — S91001S Unspecified open wound, right ankle, sequela: Secondary | ICD-10-CM

## 2014-10-16 MED ORDER — GENTAMICIN SULFATE 0.1 % EX OINT: 1.0000 "application " | TOPICAL_OINTMENT | Freq: Three times a day (TID) | CUTANEOUS | Status: DC

## 2014-10-16 MED ORDER — BUDESONIDE 3 MG PO CPEP
3.0000 mg | ORAL_CAPSULE | Freq: Every day | ORAL | Status: DC
Start: 2014-10-16 — End: 2020-04-08

## 2014-10-16 NOTE — Assessment & Plan Note (Signed)
Symptoms well controlled with Budesonide qod. Will continue.

## 2014-10-16 NOTE — Assessment & Plan Note (Signed)
Symptoms resolved with B12 supplementation. Will continue to monitor. Follow up prn.

## 2014-10-16 NOTE — Assessment & Plan Note (Signed)
Will start topical gentamicin. Discussed that eschar will need to come off for wound to heal by secondary intention. Discussed referral to wound healing center, but she would like to hold off for now. Will monitor.

## 2014-10-16 NOTE — Patient Instructions (Signed)
Apply a small amount of gentamicin to wound on right lower leg twice daily.  Call if wound is not improving.

## 2014-10-16 NOTE — Progress Notes (Signed)
Pre visit review using our clinic review tool, if applicable. No additional management support is needed unless otherwise documented below in the visit note. 

## 2014-10-16 NOTE — Progress Notes (Signed)
Subjective:    [name]: Jimmy Footman, female    DOB: 1933/03/26, 81 y.o.   MRN: [id number]  HPI  81YO female presents for follow up.  Last seen 8/5 for severe fatigue. Lab evaluation was normal except for slightly low B12. Started on SL B12 supplement.  Fatigue has resolved. Taking B12 supplement 5038mcg daily. No residual fatigue or other symptoms.  Right leg wound - Hit lower right leg about 1 month ago. Developed skin tear. Has been applying topical OTC antibiotic cream. No redness, pain, fever, chills.    Wt Readings from Last 3 Encounters:  10/16/14 149 lb 6 oz (67.756 kg)  09/14/14 149 lb (67.586 kg)  03/14/14 151 lb 8 oz (68.72 kg)   BP Readings from Last 3 Encounters:  10/16/14 121/76  09/14/14 107/67  03/14/14 132/78     Past Medical History  Diagnosis Date  . Lymphocytic colitis   . Hypertension   . Cancer 16 yrs ago    breast, left  . Ulcerative colitis 2014   Family History  Problem Relation Age of Onset  . Heart disease Mother   . Hypertension Mother   . Cancer Mother     ovarian  . Hypertension Sister   . Hypertension Brother    Past Surgical History  Procedure Laterality Date  . Breast surgery Left 1998    lumpectomy, with radiation  . Breast surgery Right 2014    excision with wire localization   Social History   Social History  . Marital Status: Married    Spouse Name: N/A  . Number of Children: 1  . Years of Education: N/A   Occupational History  . Retired     Social History Main Topics  . Smoking status: Never Smoker   . Smokeless tobacco: Never Used  . Alcohol Use: No  . Drug Use: No  . Sexual Activity: Not Asked   Other Topics Concern  . None   Social History Narrative   Lives in [place]. Husband passed away 79. [name], daughter.      Work - [employer] 13 years, then [employer] - regular    Review of Systems  Constitutional: Negative for fever, chills,  appetite change, fatigue and unexpected weight change.  Eyes: Negative for visual disturbance.  Respiratory: Negative for shortness of breath.   Cardiovascular: Negative for chest pain and leg swelling.  Gastrointestinal: Negative for nausea, vomiting, abdominal pain, diarrhea and constipation.  Musculoskeletal: Negative for myalgias and arthralgias.  Skin: Positive for color change and wound. Negative for rash.  Neurological: Negative for weakness.  Hematological: Negative for adenopathy. Does not bruise/bleed easily.  Psychiatric/Behavioral: Negative for sleep disturbance and dysphoric mood. The patient is not nervous/anxious.        Objective:    BP 121/76 mmHg  Pulse 73  Temp(Src) 98 F (36.7 C) (Oral)  Ht 5' 7.5" (1.715 m)  Wt 149 lb 6 oz (67.756 kg)  BMI 23.04 kg/m2  SpO2 97% Physical Exam  Constitutional: She is oriented to person, place, and time. She appears well-developed and well-nourished. No distress.  HENT:  Head: Normocephalic and atraumatic.  Right Ear: External ear normal.  Left Ear: External ear normal.  Nose: Nose normal.  Mouth/Throat: Oropharynx is clear and moist. No oropharyngeal exudate.  Eyes: Conjunctivae are normal. Pupils are equal, round, and reactive to light. Right eye exhibits no discharge. Left eye exhibits no  discharge. No scleral icterus.  Neck: Normal range of motion. Neck supple. No tracheal deviation present. No thyromegaly present.  Cardiovascular: Normal rate, regular rhythm, normal heart sounds and intact distal pulses.  Exam reveals no gallop and no friction rub.   No murmur heard. Pulmonary/Chest: Effort normal and breath sounds normal. No respiratory distress. She has no wheezes. She has no rales. She exhibits no tenderness.  Musculoskeletal: Normal range of motion. She exhibits no edema or tenderness.  Lymphadenopathy:    She has no cervical adenopathy.  Neurological: She is alert and oriented to person, place, and time. No cranial  nerve deficit. She exhibits normal muscle tone. Coordination normal.  Skin: Skin is warm and dry. No rash noted. She is not diaphoretic. No erythema. No pallor.     Psychiatric: She has a normal mood and affect. Her behavior is normal. Judgment and thought content normal.          Assessment & Plan:   Problem List Items Addressed This Visit      Unprioritized   Chronic fatigue - Primary    Symptoms resolved with B12 supplementation. Will continue to monitor. Follow up prn.      Lymphocytic colitis    Symptoms well controlled with Budesonide qod. Will continue.      Open wound of knee, leg (except thigh), and ankle, complicated    Will start topical gentamicin. Discussed that eschar will need to come off for wound to heal by secondary intention. Discussed referral to wound healing center, but she would like to hold off for now. Will monitor.      Relevant Medications   gentamicin ointment (GARAMYCIN) 0.1 %    Other Visit Diagnoses    Screening for breast cancer        Relevant Orders    MM Digital Screening        Return in about 3 months (around 01/15/2015) for Wellness Visit.

## 2014-10-19 ENCOUNTER — Other Ambulatory Visit: Payer: Self-pay | Admitting: Internal Medicine

## 2014-10-29 ENCOUNTER — Ambulatory Visit
Admission: RE | Admit: 2014-10-29 | Discharge: 2014-10-29 | Disposition: A | Discharge status: Home / Self Care | Payer: Medicare PPO | Source: Ambulatory Visit | Attending: Internal Medicine | Admitting: Internal Medicine

## 2014-10-29 ENCOUNTER — Other Ambulatory Visit: Payer: Self-pay | Admitting: Internal Medicine

## 2014-10-29 DIAGNOSIS — Z1231 Encounter for screening mammogram for malignant neoplasm of breast: Secondary | ICD-10-CM | POA: Insufficient documentation

## 2014-10-29 DIAGNOSIS — Z1239 Encounter for other screening for malignant neoplasm of breast: Secondary | ICD-10-CM

## 2014-10-29 HISTORY — DX: Malignant neoplasm of unspecified site of unspecified female breast: C50.919

## 2014-10-30 ENCOUNTER — Other Ambulatory Visit: Payer: Self-pay | Admitting: Internal Medicine

## 2014-10-30 DIAGNOSIS — R928 Other abnormal and inconclusive findings on diagnostic imaging of breast: Secondary | ICD-10-CM

## 2014-11-07 ENCOUNTER — Ambulatory Visit
Admission: RE | Admit: 2014-11-07 | Discharge: 2014-11-07 | Disposition: A | Discharge status: Home / Self Care | Payer: Medicare PPO | Source: Ambulatory Visit | Attending: Internal Medicine | Admitting: Internal Medicine

## 2014-11-07 DIAGNOSIS — R928 Other abnormal and inconclusive findings on diagnostic imaging of breast: Secondary | ICD-10-CM | POA: Insufficient documentation

## 2014-11-07 DIAGNOSIS — N6489 Other specified disorders of breast: Secondary | ICD-10-CM | POA: Diagnosis present

## 2014-11-08 ENCOUNTER — Other Ambulatory Visit: Payer: Self-pay | Admitting: Internal Medicine

## 2014-11-10 DIAGNOSIS — I82409 Acute embolism and thrombosis of unspecified deep veins of unspecified lower extremity: Secondary | ICD-10-CM

## 2014-11-10 HISTORY — DX: Acute embolism and thrombosis of unspecified deep veins of unspecified lower extremity: I82.409

## 2014-12-10 DIAGNOSIS — R600 Localized edema: Secondary | ICD-10-CM | POA: Diagnosis not present

## 2014-12-11 ENCOUNTER — Encounter: Payer: Self-pay | Admitting: Family Medicine

## 2014-12-11 ENCOUNTER — Ambulatory Visit (INDEPENDENT_AMBULATORY_CARE_PROVIDER_SITE_OTHER): Payer: Medicare PPO | Admitting: Family Medicine

## 2014-12-11 ENCOUNTER — Ambulatory Visit
Admission: RE | Admit: 2014-12-11 | Discharge: 2014-12-11 | Disposition: A | Discharge status: Home / Self Care | Payer: Medicare PPO | Source: Ambulatory Visit | Attending: Family Medicine | Admitting: Family Medicine

## 2014-12-11 VITALS — BP 112/82 | HR 63 | Temp 98.3°F | Ht 67.5 in | Wt 153.4 lb

## 2014-12-11 DIAGNOSIS — I82401 Acute embolism and thrombosis of unspecified deep veins of right lower extremity: Secondary | ICD-10-CM | POA: Diagnosis not present

## 2014-12-11 DIAGNOSIS — Z86718 Personal history of other venous thrombosis and embolism: Secondary | ICD-10-CM | POA: Insufficient documentation

## 2014-12-11 DIAGNOSIS — I82411 Acute embolism and thrombosis of right femoral vein: Secondary | ICD-10-CM

## 2014-12-11 DIAGNOSIS — M7989 Other specified soft tissue disorders: Secondary | ICD-10-CM

## 2014-12-11 DIAGNOSIS — I82491 Acute embolism and thrombosis of other specified deep vein of right lower extremity: Secondary | ICD-10-CM | POA: Diagnosis not present

## 2014-12-11 DIAGNOSIS — I82409 Acute embolism and thrombosis of unspecified deep veins of unspecified lower extremity: Secondary | ICD-10-CM | POA: Insufficient documentation

## 2014-12-11 MED ORDER — RIVAROXABAN 15 MG PO TABS
15.0000 mg | ORAL_TABLET | Freq: Two times a day (BID) | ORAL | Status: DC
Start: 2014-12-11 — End: 2014-12-28

## 2014-12-11 NOTE — Patient Instructions (Signed)
We will call with your results.  Please follow up closely with Dr. Gilford Rile  Take care  Dr. Lacinda Axon

## 2014-12-11 NOTE — Assessment & Plan Note (Signed)
Unclear etiology. Given size difference, edema, and decreased pulses obtaining vascular ultrasound.

## 2014-12-11 NOTE — Progress Notes (Signed)
Pre visit review using our clinic review tool, if applicable. No additional management support is needed unless otherwise documented below in the visit note. 

## 2014-12-11 NOTE — Assessment & Plan Note (Signed)
Ultrasound returned revealing extensive DVT. Starting treatment with xarelto. Discussed case with vascular surgeon Dr. Lucky Cowboy.  He informed me that she may be a candidate for thrombolysis. I scheduled her an appointment for tomorrow.

## 2014-12-11 NOTE — Progress Notes (Addendum)
Subjective:  [name]: Mia Perez, female    DOB: October 15, 1933  Age: 81 y.o. MRN: [id number]  CC: Right leg swelling  HPI:  81 year old female presents to clinic today for an acute visit with complaints of right leg swelling.  Right leg swelling  Patient reports that she did some yard work on 10/9.  The next day she developed swelling and pain of her right leg.  She has taken Tylenol for her pain and after approximately 7-10 days her pain resolved.  However, the swelling has persisted.  No exacerbating or relieving factors.  No associated shortness of breath, chest pain.  No reports of fever or significant weight loss.  No other complaints today.  Social Hx   Social History   Social History  . Marital Status: Married    Spouse Name: N/A  . Number of Children: 1  . Years of Education: N/A   Occupational History  . Retired     Social History Main Topics  . Smoking status: Never Smoker   . Smokeless tobacco: Never Used  . Alcohol Use: No  . Drug Use: No  . Sexual Activity: Not Asked   Other Topics Concern  . None   Social History Narrative   Lives in [place]. Husband passed away 82. [name], daughter.      Work - [employer] 13 years, then [employer] - regular   Review of Systems  Constitutional: Negative.   Respiratory: Negative for shortness of breath.   Cardiovascular: Negative for chest pain.  Musculoskeletal:       Leg swelling.    Objective:  BP 112/82 mmHg  Pulse 63  Temp(Src) 98.3 F (36.8 C) (Oral)  Ht 5' 7.5" (1.715 m)  Wt 153 lb 6 oz (69.57 kg)  BMI 23.65 kg/m2  SpO2 96%  BP/Weight 12/11/2014 08/17/2954 03/12/3084  Systolic BP 578 469 629  Diastolic BP 82 76 67  Wt. (Lbs) 153.38 149.38 149  BMI 23.65 23.04 22.98   Physical Exam  Constitutional: She appears well-developed. No distress.  Cardiovascular: Normal rate and regular rhythm.   Right leg with 1+ edema extending up to  the thigh. Cannot palpate DP or PT pulses on that leg.  Additionally, there is hyperpigmentation consistent with venous insufficiency.  Pulmonary/Chest: No respiratory distress. She has no wheezes. She has no rales.  Neurological: She is alert.  Psychiatric: She has a normal mood and affect.    Lab Results  Component Value Date   WBC 8.8 09/14/2014   HGB 12.8 09/14/2014   HCT 38.2 09/14/2014   PLT 453.0* 09/14/2014   GLUCOSE 105* 09/14/2014   CHOL 203* 09/19/2013   TRIG 146.0 09/19/2013   HDL 43.40 09/19/2013   LDLDIRECT 152.8 08/31/2012   LDLCALC 130* 09/19/2013   ALT 12 09/14/2014   AST 12 09/14/2014   NA 139 09/14/2014   K 4.4 09/14/2014   CL 107 09/14/2014   CREATININE 1.13 09/14/2014   BUN 31* 09/14/2014   CO2 24 09/14/2014   TSH 2.31 09/14/2014   MICROALBUR 0.8 09/19/2013    Assessment & Plan:   Problem List Items Addressed This Visit    Swelling of right lower extremity    Unclear etiology. Given size difference, edema, and decreased pulses obtaining vascular ultrasound.      Relevant Orders   US Venous Img Lower Unilateral Right (Completed)   Acute DVT (deep venous thrombosis) (  [place]) - Primary    Ultrasound returned revealing extensive DVT. Starting treatment with xarelto. Discussed case with vascular surgeon Dr. Lucky Cowboy.  He informed me that she may be a candidate for thrombolysis. I scheduled her an appointment for tomorrow.      Relevant Medications   Rivaroxaban (XARELTO) 15 MG TABS tablet   Other Relevant Orders   VAS [place] LOWER EXTREMITY VENOUS (DVT)   US Venous Img Lower Unilateral Right (Completed)   Ambulatory referral to Vascular Surgery     Follow-up: PRN  Thersa Salt, DO

## 2014-12-11 NOTE — Addendum Note (Signed)
Addended by: Coral Spikes on: 12/11/2014 04:38 PM   Modules accepted: Orders

## 2014-12-12 ENCOUNTER — Other Ambulatory Visit: Payer: Self-pay | Admitting: Vascular Surgery

## 2014-12-12 DIAGNOSIS — I8310 Varicose veins of unspecified lower extremity with inflammation: Secondary | ICD-10-CM | POA: Diagnosis not present

## 2014-12-12 DIAGNOSIS — I824Y9 Acute embolism and thrombosis of unspecified deep veins of unspecified proximal lower extremity: Secondary | ICD-10-CM | POA: Diagnosis not present

## 2014-12-12 DIAGNOSIS — I10 Essential (primary) hypertension: Secondary | ICD-10-CM | POA: Diagnosis not present

## 2014-12-12 DIAGNOSIS — I824Z9 Acute embolism and thrombosis of unspecified deep veins of unspecified distal lower extremity: Secondary | ICD-10-CM | POA: Diagnosis not present

## 2014-12-12 DIAGNOSIS — M7989 Other specified soft tissue disorders: Secondary | ICD-10-CM | POA: Diagnosis not present

## 2014-12-12 DIAGNOSIS — M79609 Pain in unspecified limb: Secondary | ICD-10-CM | POA: Diagnosis not present

## 2014-12-13 ENCOUNTER — Ambulatory Visit
Admission: RE | Admit: 2014-12-13 | Discharge: 2014-12-13 | Disposition: A | Discharge status: Home / Self Care | Payer: Medicare PPO | Source: Ambulatory Visit | Attending: Vascular Surgery | Admitting: Vascular Surgery

## 2014-12-13 ENCOUNTER — Encounter: Payer: Self-pay | Admitting: *Deleted

## 2014-12-13 ENCOUNTER — Encounter
Admission: RE | Disposition: A | Discharge status: Home / Self Care | Payer: Self-pay | Source: Ambulatory Visit | Attending: Vascular Surgery

## 2014-12-13 DIAGNOSIS — I868 Varicose veins of other specified sites: Secondary | ICD-10-CM | POA: Diagnosis not present

## 2014-12-13 DIAGNOSIS — I82401 Acute embolism and thrombosis of unspecified deep veins of right lower extremity: Secondary | ICD-10-CM | POA: Insufficient documentation

## 2014-12-13 DIAGNOSIS — I10 Essential (primary) hypertension: Secondary | ICD-10-CM | POA: Insufficient documentation

## 2014-12-13 DIAGNOSIS — Z79899 Other long term (current) drug therapy: Secondary | ICD-10-CM | POA: Insufficient documentation

## 2014-12-13 DIAGNOSIS — I80211 Phlebitis and thrombophlebitis of right iliac vein: Secondary | ICD-10-CM | POA: Diagnosis not present

## 2014-12-13 DIAGNOSIS — C50919 Malignant neoplasm of unspecified site of unspecified female breast: Secondary | ICD-10-CM | POA: Diagnosis not present

## 2014-12-13 DIAGNOSIS — I80291 Phlebitis and thrombophlebitis of other deep vessels of right lower extremity: Secondary | ICD-10-CM | POA: Diagnosis not present

## 2014-12-13 DIAGNOSIS — Z880 Allergy status to penicillin: Secondary | ICD-10-CM | POA: Insufficient documentation

## 2014-12-13 HISTORY — DX: Asymptomatic varicose veins of unspecified lower extremity: I83.90

## 2014-12-13 HISTORY — PX: PERIPHERAL VASCULAR CATHETERIZATION: SHX172C

## 2014-12-13 LAB — CREATININE, SERUM
CREATININE: 1.17 mg/dL — AB (ref 0.44–1.00)
GFR calc Af Amer: 50 mL/min — ABNORMAL LOW (ref >60)
GFR, EST NON AFRICAN AMERICAN: 43 mL/min — AB (ref >60)

## 2014-12-13 LAB — BUN: BUN: 26 mg/dL — AB (ref 6–20)

## 2014-12-13 SURGERY — IVC FILTER INSERTION
Anesthesia: Moderate Sedation

## 2014-12-13 MED ORDER — HEPARIN SODIUM (PORCINE) 1000 UNIT/ML IJ SOLN
INTRAMUSCULAR | Status: AC
  Filled 2014-12-13: qty 1

## 2014-12-13 MED ORDER — FENTANYL CITRATE (PF) 100 MCG/2ML IJ SOLN
INTRAMUSCULAR | Status: DC | PRN
  Administered 2014-12-13 (×2): 50 ug via INTRAVENOUS

## 2014-12-13 MED ORDER — SODIUM CHLORIDE 0.9 % IV SOLN
INTRAVENOUS | Status: DC
Start: 2014-12-13 — End: 2014-12-13
  Administered 2014-12-13: 10:00:00 via INTRAVENOUS

## 2014-12-13 MED ORDER — LIDOCAINE-EPINEPHRINE (PF) 1 %-1:200000 IJ SOLN
INTRAMUSCULAR | Status: DC | PRN
  Administered 2014-12-13 (×2): 10 mL via INTRADERMAL

## 2014-12-13 MED ORDER — SODIUM BICARBONATE BOLUS VIA INFUSION
INTRAVENOUS | Status: AC
  Administered 2014-12-13: 11:00:00 via INTRAVENOUS
  Filled 2014-12-13: qty 1

## 2014-12-13 MED ORDER — STERILE WATER FOR INJECTION IJ SOLN
INTRAMUSCULAR | Status: AC
  Filled 2014-12-13: qty 40

## 2014-12-13 MED ORDER — ACETAMINOPHEN 325 MG RE SUPP: 325.0000 mg | RECTAL | Status: DC | PRN

## 2014-12-13 MED ORDER — LABETALOL HCL 5 MG/ML IV SOLN: 10.0000 mg | INTRAVENOUS | Status: DC | PRN

## 2014-12-13 MED ORDER — PHENOL 1.4 % MT LIQD: 1.0000 | OROMUCOSAL | Status: DC | PRN

## 2014-12-13 MED ORDER — HEPARIN (PORCINE) IN NACL 2-0.9 UNIT/ML-% IJ SOLN
INTRAMUSCULAR | Status: AC
  Filled 2014-12-13: qty 1500

## 2014-12-13 MED ORDER — MIDAZOLAM HCL 5 MG/5ML IJ SOLN
INTRAMUSCULAR | Status: AC
  Filled 2014-12-13: qty 5

## 2014-12-13 MED ORDER — METOPROLOL TARTRATE 1 MG/ML IV SOLN: 2.0000 mg | INTRAVENOUS | Status: DC | PRN

## 2014-12-13 MED ORDER — GUAIFENESIN-DM 100-10 MG/5ML PO SYRP: 15.0000 mL | ORAL_SOLUTION | ORAL | Status: DC | PRN

## 2014-12-13 MED ORDER — HYDRALAZINE HCL 20 MG/ML IJ SOLN: 5.0000 mg | INTRAMUSCULAR | Status: DC | PRN

## 2014-12-13 MED ORDER — CLINDAMYCIN PHOSPHATE 300 MG/50ML IV SOLN
INTRAVENOUS | Status: AC
  Filled 2014-12-13: qty 50

## 2014-12-13 MED ORDER — SODIUM BICARBONATE 8.4 % IV SOLN
INTRAVENOUS | Status: DC
  Filled 2014-12-13: qty 1000

## 2014-12-13 MED ORDER — OXYCODONE-ACETAMINOPHEN 5-325 MG PO TABS: 1.0000 | ORAL_TABLET | ORAL | Status: DC | PRN

## 2014-12-13 MED ORDER — MORPHINE SULFATE (PF) 4 MG/ML IV SOLN: 2.0000 mg | INTRAVENOUS | Status: DC | PRN

## 2014-12-13 MED ORDER — FENTANYL CITRATE (PF) 100 MCG/2ML IJ SOLN
INTRAMUSCULAR | Status: AC
  Filled 2014-12-13: qty 2

## 2014-12-13 MED ORDER — ACETAMINOPHEN 325 MG PO TABS: 325.0000 mg | ORAL_TABLET | ORAL | Status: DC | PRN

## 2014-12-13 MED ORDER — SODIUM CHLORIDE 0.9 % IV SOLN: 500.0000 mL | Freq: Once | INTRAVENOUS | Status: DC | PRN

## 2014-12-13 MED ORDER — LIDOCAINE-EPINEPHRINE (PF) 1 %-1:200000 IJ SOLN
INTRAMUSCULAR | Status: AC
  Filled 2014-12-13: qty 30

## 2014-12-13 MED ORDER — ALUM & MAG HYDROXIDE-SIMETH 200-200-20 MG/5ML PO SUSP: 15.0000 mL | ORAL | Status: DC | PRN

## 2014-12-13 MED ORDER — IOHEXOL 300 MG/ML  SOLN
INTRAMUSCULAR | Status: DC | PRN
  Administered 2014-12-13: 50 mL via INTRAVENOUS

## 2014-12-13 MED ORDER — HEPARIN SODIUM (PORCINE) 1000 UNIT/ML IJ SOLN
INTRAMUSCULAR | Status: DC | PRN
  Administered 2014-12-13: 3000 [IU] via INTRAVENOUS

## 2014-12-13 MED ORDER — ALTEPLASE 2 MG IJ SOLR
INTRAMUSCULAR | Status: AC
  Filled 2014-12-13: qty 8

## 2014-12-13 MED ORDER — MIDAZOLAM HCL 2 MG/2ML IJ SOLN
INTRAMUSCULAR | Status: DC | PRN
  Administered 2014-12-13 (×2): 2 mg via INTRAVENOUS

## 2014-12-13 MED ORDER — CLINDAMYCIN PHOSPHATE 300 MG/50ML IV SOLN
300.0000 mg | Freq: Once | INTRAVENOUS | Status: AC
  Administered 2014-12-13: 300 mg via INTRAVENOUS

## 2014-12-13 MED ORDER — ONDANSETRON HCL 4 MG/2ML IJ SOLN: 4.0000 mg | Freq: Four times a day (QID) | INTRAMUSCULAR | Status: DC | PRN

## 2014-12-13 SURGICAL SUPPLY — 17 items
BALLN ARMADA 10X80X135 (BALLOONS) ×3
BALLN ARMADA 12X80X80 (BALLOONS) ×3
BALLOON ARMADA 10X80X135 (BALLOONS) ×2 IMPLANT
BALLOON ARMADA 12X80X80 (BALLOONS) ×2 IMPLANT
CANNULA 5F STIFF (CANNULA) ×3 IMPLANT
CATH KA2 5FR 65CM (CATHETERS) ×3 IMPLANT
DEVICE SOLENT PROXI 90CM (CATHETERS) ×3 IMPLANT
DRAPE BRACHIAL (DRAPES) ×3 IMPLANT
DRAPE TABLE BACK 80X90 (DRAPES) ×3 IMPLANT
FILTER VC CELECT-FEMORAL (Filter) ×3 IMPLANT
PACK ANGIOGRAPHY (CUSTOM PROCEDURE TRAY) ×3 IMPLANT
SET ZELANTE DVT THROMB (CATHETERS) ×3 IMPLANT
SHEATH BRITE TIP 6FRX11 (SHEATH) ×3 IMPLANT
SHEATH BRITE TIP 8FRX11 (SHEATH) ×3 IMPLANT
TOWEL OR 17X26 4PK STRL BLUE (TOWEL DISPOSABLE) ×3 IMPLANT
WIRE J 3MM .035X145CM (WIRE) ×3 IMPLANT
WIRE MAGIC TORQUE 260C (WIRE) ×3 IMPLANT

## 2014-12-13 NOTE — H&P (Signed)
  [facility] History & Physical Update  The patient was interviewed and re-examined.  The patient's previous History and Physical has been reviewed and is unchanged.  There is no change in the plan of care. We plan to proceed with the scheduled procedure.  Marelly Wehrman, MD  12/13/2014, 9:33 AM

## 2014-12-13 NOTE — Op Note (Signed)
[facility]   OPERATIVE NOTE   PRE-OPERATIVE DIAGNOSIS: extensive RLE DVT  POST-OPERATIVE DIAGNOSIS: same   PROCEDURE: 1. US guidance for vascular access to left femoral vein and and right lesser saphenous vein 2. Catheter placement into IVC from left femoral vein approach and right lesser saphenous venous approach 3. IVC gram and RLE venogram 4. IVC filter placement 5.   Catheter directed thrombolysis with 8 mg of tpa to the right femoral vein and iliac vein with the Angiojet Proxy catheter 6. Mechanical rheolytic thrombectomy to right femoral vein and iliac veins 7. PTA of right femoral vein with 10 mm balloon 8. PTA of right iliac veins with 10 and 12 mm balloon   SURGEON: DEW,JASON, MD  ASSISTANT(S): none  ANESTHESIA: local/MCS  ESTIMATED BLOOD LOSS: minimal  FINDING(S): 1. extensive RLE DVT, IVC was patent  SPECIMEN(S): none  INDICATIONS:  Patient is a 81 y.o. female who presents with leg swelling and RLE DVT which was extensive. Patient has marked leg swelling and pain. Venous intervention is performed to reduce the symtpoms and avoid long term postphlebitic symptoms.   DESCRIPTION: After obtaining full informed written consent, the patient was brought back to the vascular suite and placed supine upon the table. The patient received IV antibiotics prior to induction. After obtaining adequate anesthesia, the patient was prepped and draped in the standard fashion. The left femoral vein was then accessed under US guidance and found to be widely patent. It was accessed without difficulty and a permanent image was recorded. I then placed the delivery sheath into the IVC. The IVC was patent and the renal veins were at the level of L1. The Cook Celect IVC filter was then deployed at the level of L2. The delivery sheath was then removed and dressings were placed in the left groin.  The patient was then placed into the prone  position. The right lesser saphenous vein was then accessed under direct ultrasound guidance without difficulty with a micropuncture needle and a permanent image was recorded. I then upsized to an 6Fr sheath over a J wire. An 8 Fr sheath did not easily track as the distance through the smaller lesser saphenous vein was long. 3000 units of heparin were then given. Imaging showed extensive DVT with minimal flow. A Kumpe catheter and Advantage wire were then advanced into the CFV and images were performed. The right iliac vein was thrombosed and nearly occluded. I was able to cross the thrombus and stenosis and advance into the IVC which was patent. I then used the Angiojet Proxy catheter and instilled 8 mg of tpa throughout the CFV, and iliac veins.  After this dwelled for 15 minutes, I used the Proxy catheter and evacuated about 110 cc of effluent with mechanical rheolytic thrombectomy throughout the iliac veins and femoral veins. This had mild improvement. I then treated the CFV with 10 mm diameter angioplasty balloon to open a channel. This resulted in some resolution of the thrombus and improved flow. I then turned my attention to the iliac veins. The stenosis/occlusion and thrombus was treated with a 10 mm diameter angioplasty balloon initially with minimal improvement.  I then upsized to 12 mm balloon in the iliac veins and used two inflations of a 12 mm x 8 cm balloon in the right iliac veins.  This resulted in signficant improvement and a decent channel of flow was now present. I then elected to terminate the procedure. The sheath was removed and a dressing was placed.  She was taken to the recovery room in stable condition having tolerated the procedure well.   COMPLICATIONS: None  CONDITION: Stable  DEW,JASON 12/13/2014 12:50 PM

## 2014-12-13 NOTE — OR Nursing (Signed)
At 14:38, patient was putting clothing on to leave facility and rt popliteal site started bleeding.  Pressure held to site x51min site stable dry sterile guaze dressing applied and wrapped in coban.  Patient remains in unit at present for observation of site x 30 min.  Dr. Lucky Cowboy aware

## 2014-12-18 ENCOUNTER — Encounter: Payer: Self-pay | Admitting: Vascular Surgery

## 2014-12-18 ENCOUNTER — Other Ambulatory Visit: Payer: Self-pay | Admitting: Internal Medicine

## 2014-12-20 ENCOUNTER — Encounter: Payer: Self-pay | Admitting: Internal Medicine

## 2014-12-20 ENCOUNTER — Ambulatory Visit (INDEPENDENT_AMBULATORY_CARE_PROVIDER_SITE_OTHER): Payer: Medicare PPO | Admitting: Internal Medicine

## 2014-12-20 VITALS — BP 118/78 | HR 72 | Temp 97.9°F | Resp 18 | Wt 152.6 lb

## 2014-12-20 DIAGNOSIS — I82411 Acute embolism and thrombosis of right femoral vein: Secondary | ICD-10-CM

## 2014-12-20 MED ORDER — DIPHENOXYLATE-ATROPINE 2.5-0.025 MG PO TABS: ORAL_TABLET | ORAL | Status: DC

## 2014-12-20 NOTE — Progress Notes (Signed)
Pre visit review using our clinic review tool, if applicable. No additional management support is needed unless otherwise documented below in the visit note. 

## 2014-12-20 NOTE — Progress Notes (Signed)
Subjective:    [name]: Mia Perez, female    DOB: April 24, 1933, 81 y.o.   MRN: [id number]  HPI  81YO female presents for follow up.  Recently had pain and swelling in right lower leg. Ultimately diagnosed with DVT. Started on Xarelto. Underwent thrombolysis and IVC filter placed with Dr. Lucky Cowboy 11/3. Has follow up with Dr. Lucky Cowboy on 11/27.  No previous history of blood clots. She has had pain in her right leg, but never swelling like this occasion. In June, was on a bus tour for 2 weeks and was more sedentary, but no other recent travel. Very active. Exercises regularly. Cancer screening UTD. Mammogram 10/2014. Colonoscopy completed 2012 and repeat due in 2017.   Wt Readings from Last 3 Encounters:  12/20/14 152 lb 9.6 oz (69.219 kg)  12/11/14 153 lb 6 oz (69.57 kg)  10/16/14 149 lb 6 oz (67.756 kg)   BP Readings from Last 3 Encounters:  12/20/14 118/78  12/13/14 156/78  12/11/14 112/82    Past Medical History  Diagnosis Date  . Lymphocytic colitis   . Hypertension   . Cancer ([place]) 16 yrs ago    breast, left  . Ulcerative colitis ([place]) 2014  . Breast cancer ([place]) 02/10/1996    left, lumpectomy and radiation  . Varicose veins    Family History  Problem Relation Age of Onset  . Heart disease Mother   . Hypertension Mother   . Cancer Mother     ovarian  . Ovarian cancer Mother 62  . Hypertension Sister   . Hypertension Brother   . Ovarian cancer Paternal Aunt 9   Past Surgical History  Procedure Laterality Date  . Breast surgery Left 1998    lumpectomy, with radiation  . Breast surgery Right 2014    excision with wire localization  . Hernia repair    . Breast lumpectomy    . Neck surgery    . Dilation and curettage of uterus    . Peripheral vascular catheterization N/A 12/13/2014    Procedure: IVC Filter Insertion;  Surgeon: Algernon Huxley, MD;  Location: [facility];  Service: Cardiovascular;  Laterality: N/A;  . Peripheral vascular catheterization   12/13/2014    Procedure: Lower Extremity Intervention;  Surgeon: Algernon Huxley, MD;  Location: [facility];  Service: Cardiovascular;;   Social History   Social History  . Marital Status: Widowed    Spouse Name: N/A  . Number of Children: 1  . Years of Education: N/A   Occupational History  . Retired     Social History Main Topics  . Smoking status: Never Smoker   . Smokeless tobacco: Never Used  . Alcohol Use: No  . Drug Use: No  . Sexual Activity: Not Asked   Other Topics Concern  . None   Social History Narrative   Lives in [place]. Husband passed away 54. [name], daughter.      Work - [employer] 13 years, then [employer] - regular    Review of Systems  Constitutional: Negative for fever, chills, appetite change, fatigue and unexpected weight change.  Eyes: Negative for visual disturbance.  Respiratory: Negative for cough and shortness of breath.   Cardiovascular: Positive for leg swelling. Negative for chest pain and palpitations.  Gastrointestinal: Negative for nausea, vomiting, abdominal pain, diarrhea and constipation.  Musculoskeletal: Negative for myalgias and arthralgias.  Skin: Negative for  color change and rash.  Hematological: Negative for adenopathy. Does not bruise/bleed easily.  Psychiatric/Behavioral: Negative for sleep disturbance and dysphoric mood. The patient is not nervous/anxious.        Objective:    BP 118/78 mmHg  Pulse 72  Temp(Src) 97.9 F (36.6 C) (Oral)  Resp 18  Wt 152 lb 9.6 oz (69.219 kg)  SpO2 98% Physical Exam  Constitutional: She is oriented to person, place, and time. She appears well-developed and well-nourished. No distress.  HENT:  Head: Normocephalic and atraumatic.  Right Ear: External ear normal.  Left Ear: External ear normal.  Nose: Nose normal.  Mouth/Throat: Oropharynx is clear and moist. No oropharyngeal exudate.  Eyes: Conjunctivae are normal. Pupils are  equal, round, and reactive to light. Right eye exhibits no discharge. Left eye exhibits no discharge. No scleral icterus.  Neck: Normal range of motion. Neck supple. No tracheal deviation present. No thyromegaly present.  Cardiovascular: Normal rate, regular rhythm, normal heart sounds and intact distal pulses.  Exam reveals no gallop and no friction rub.   No murmur heard. Pulmonary/Chest: Effort normal and breath sounds normal. No respiratory distress. She has no wheezes. She has no rales. She exhibits no tenderness.  Musculoskeletal: Normal range of motion. She exhibits edema (minimal edema to upper thigh). She exhibits no tenderness.  Lymphadenopathy:    She has no cervical adenopathy.  Neurological: She is alert and oriented to person, place, and time. No cranial nerve deficit. She exhibits normal muscle tone. Coordination normal.  Skin: Skin is warm and dry. No rash noted. She is not diaphoretic. No erythema. No pallor.  Psychiatric: She has a normal mood and affect. Her behavior is normal. Judgment and thought content normal.          Assessment & Plan:  Over 71min of which >50% spent in face-to-face contact with patient discussing plan of care  Problem List Items Addressed This Visit      Unprioritized   Acute DVT (deep venous thrombosis) ([place]) - Primary    Reviewed records from [place] and thrombolysis. Will continue Xarelto. We reviewed potential side effects of this medication. Also reviewed potential causes for DVT. Cancer screening is UTD. No recent prolonged sitting. Appears to be unprovoked. Follow up with Dr. Lucky Cowboy as scheduled. Follow up 4 weeks and prn.       Relevant Orders   CBC w/Diff       Return in about 4 weeks (around 01/17/2015).

## 2014-12-20 NOTE — Patient Instructions (Signed)
Labs today.  Follow up in 4 weeks. 

## 2014-12-20 NOTE — Assessment & Plan Note (Signed)
Reviewed records from [place] and thrombolysis. Will continue Xarelto. We reviewed potential side effects of this medication. Also reviewed potential causes for DVT. Cancer screening is UTD. No recent prolonged sitting. Appears to be unprovoked. Follow up with Dr. Lucky Cowboy as scheduled. Follow up 4 weeks and prn.

## 2014-12-28 ENCOUNTER — Other Ambulatory Visit: Payer: Self-pay | Admitting: Family Medicine

## 2015-01-01 DIAGNOSIS — I80291 Phlebitis and thrombophlebitis of other deep vessels of right lower extremity: Secondary | ICD-10-CM | POA: Diagnosis not present

## 2015-01-01 DIAGNOSIS — I824Y9 Acute embolism and thrombosis of unspecified deep veins of unspecified proximal lower extremity: Secondary | ICD-10-CM | POA: Diagnosis not present

## 2015-01-01 DIAGNOSIS — M7989 Other specified soft tissue disorders: Secondary | ICD-10-CM | POA: Diagnosis not present

## 2015-01-01 DIAGNOSIS — I80211 Phlebitis and thrombophlebitis of right iliac vein: Secondary | ICD-10-CM | POA: Diagnosis not present

## 2015-01-09 DIAGNOSIS — H401132 Primary open-angle glaucoma, bilateral, moderate stage: Secondary | ICD-10-CM | POA: Diagnosis not present

## 2015-01-16 ENCOUNTER — Ambulatory Visit (INDEPENDENT_AMBULATORY_CARE_PROVIDER_SITE_OTHER): Payer: Medicare PPO | Admitting: Internal Medicine

## 2015-01-16 ENCOUNTER — Encounter: Payer: Self-pay | Admitting: Internal Medicine

## 2015-01-16 VITALS — BP 117/73 | HR 65 | Temp 98.0°F | Ht 68.0 in | Wt 154.2 lb

## 2015-01-16 DIAGNOSIS — I82411 Acute embolism and thrombosis of right femoral vein: Secondary | ICD-10-CM | POA: Diagnosis not present

## 2015-01-16 NOTE — Progress Notes (Signed)
Pre visit review using our clinic review tool, if applicable. No additional management support is needed unless otherwise documented below in the visit note. 

## 2015-01-16 NOTE — Progress Notes (Signed)
Subjective:    [name]: Mia Perez, female    DOB: [birth year], 81 y.o.   MRN: [id number]  HPI  81YO female presents for follow up.  Recently seen for DVT and started on Xarelto. Seen in follow up by Dr. Lucky Cowboy. Scheduled for [place] in Feb. Scheduled for IVC filter in January. Swelling has resolved. No leg pain, no dyspnea. Feeling well with no new concerns today.    Wt Readings from Last 3 Encounters:  01/16/15 154 lb 4 oz (69.967 kg)  12/20/14 152 lb 9.6 oz (69.219 kg)  12/11/14 153 lb 6 oz (69.57 kg)   BP Readings from Last 3 Encounters:  01/16/15 117/73  12/20/14 118/78  12/13/14 156/78    Past Medical History  Diagnosis Date  . Lymphocytic colitis   . Hypertension   . Cancer ([place]) 16 yrs ago    breast, left  . Ulcerative colitis ([place]) 2014  . Breast cancer ([place]) 02/10/1996    left, lumpectomy and radiation  . Varicose veins    Family History  Problem Relation Age of Onset  . Heart disease Mother   . Hypertension Mother   . Cancer Mother     ovarian  . Ovarian cancer Mother 77  . Hypertension Sister   . Hypertension Brother   . Ovarian cancer Paternal Aunt 62   Past Surgical History  Procedure Laterality Date  . Breast surgery Left 1998    lumpectomy, with radiation  . Breast surgery Right 2014    excision with wire localization  . Hernia repair    . Breast lumpectomy    . Neck surgery    . Dilation and curettage of uterus    . Peripheral vascular catheterization N/A 12/13/2014    Procedure: IVC Filter Insertion;  Surgeon: Algernon Huxley, MD;  Location: [facility];  Service: Cardiovascular;  Laterality: N/A;  . Peripheral vascular catheterization  12/13/2014    Procedure: Lower Extremity Intervention;  Surgeon: Algernon Huxley, MD;  Location: [facility];  Service: Cardiovascular;;   Social History   Social History  . Marital Status: Widowed    Spouse Name: N/A  . Number of Children: 1  . Years of Education: N/A   Occupational  History  . Retired     Social History Main Topics  . Smoking status: Never Smoker   . Smokeless tobacco: Never Used  . Alcohol Use: No  . Drug Use: No  . Sexual Activity: Not Asked   Other Topics Concern  . None   Social History Narrative   Lives in [place]. Husband passed away 26. Mia Perez, daughter.      Work - [employer] 13 years, then [employer] - regular    Review of Systems  Constitutional: Negative for fever, chills, appetite change, fatigue and unexpected weight change.  Eyes: Negative for visual disturbance.  Respiratory: Negative for cough and shortness of breath.   Cardiovascular: Negative for chest pain and leg swelling.  Gastrointestinal: Negative for abdominal pain.  Skin: Negative for color change and rash.  Hematological: Negative for adenopathy. Does not bruise/bleed easily.  Psychiatric/Behavioral: Negative for dysphoric mood. The patient is not nervous/anxious.        Objective:    BP 117/73 mmHg  Pulse 65  Temp(Src) 98 F (36.7 C) (Oral)  Ht 5\' 8"  (1.727 m)  Wt 154 lb 4 oz (69.967 kg)  BMI  23.46 kg/m2  SpO2 93% Physical Exam  Constitutional: She is oriented to person, place, and time. She appears well-developed and well-nourished. No distress.  HENT:  Head: Normocephalic and atraumatic.  Right Ear: External ear normal.  Left Ear: External ear normal.  Nose: Nose normal.  Mouth/Throat: Oropharynx is clear and moist. No oropharyngeal exudate.  Eyes: Conjunctivae are normal. Pupils are equal, round, and reactive to light. Right eye exhibits no discharge. Left eye exhibits no discharge. No scleral icterus.  Neck: Normal range of motion. Neck supple. No tracheal deviation present. No thyromegaly present.  Cardiovascular: Normal rate, regular rhythm, normal heart sounds and intact distal pulses.  Exam reveals no gallop and no friction rub.   No murmur heard. Pulmonary/Chest: Effort normal and breath  sounds normal. No respiratory distress. She has no wheezes. She has no rales. She exhibits no tenderness.  Musculoskeletal: Normal range of motion. She exhibits no edema or tenderness.  Lymphadenopathy:    She has no cervical adenopathy.  Neurological: She is alert and oriented to person, place, and time. No cranial nerve deficit. She exhibits normal muscle tone. Coordination normal.  Skin: Skin is warm and dry. No rash noted. She is not diaphoretic. No erythema. No pallor.  Psychiatric: She has a normal mood and affect. Her behavior is normal. Judgment and thought content normal.          Assessment & Plan:   Problem List Items Addressed This Visit      Unprioritized   Acute DVT (deep venous thrombosis) ([place]) - Primary    Doing well. Swelling has resolved. Tolerating Xarelto. Scheduled for IVC filter removal and follow up US with Dr. Lucky Cowboy. Will follow.      Relevant Medications   ELIQUIS 5 MG TABS tablet       Return in about 6 months (around 07/17/2015) for Recheck.

## 2015-01-16 NOTE — Patient Instructions (Signed)
Continue current medications. 

## 2015-01-16 NOTE — Assessment & Plan Note (Signed)
Doing well. Swelling has resolved. Tolerating Xarelto. Scheduled for IVC filter removal and follow up US with Dr. Lucky Cowboy. Will follow.

## 2015-01-22 ENCOUNTER — Other Ambulatory Visit: Payer: Self-pay | Admitting: Vascular Surgery

## 2015-01-30 ENCOUNTER — Ambulatory Visit (INDEPENDENT_AMBULATORY_CARE_PROVIDER_SITE_OTHER): Payer: Medicare PPO

## 2015-01-30 ENCOUNTER — Telehealth: Payer: Self-pay

## 2015-01-30 VITALS — BP 122/76 | HR 66 | Temp 98.0°F | Resp 14 | Ht 68.0 in | Wt 154.8 lb

## 2015-01-30 DIAGNOSIS — Z0000 Encounter for general adult medical examination without abnormal findings: Secondary | ICD-10-CM

## 2015-01-30 NOTE — Progress Notes (Signed)
Subjective:   Mia Perez is a 81 y.o. female who presents for Medicare Annual (Subsequent) preventive examination.  Review of Systems:  No ROS.  Medicare Wellness Visit.  Cardiac Risk Factors include: advanced age (>36men, >49 women);hypertension     Objective:     Vitals: BP 122/76 mmHg  Pulse 66  Temp(Src) 98 F (36.7 C) (Oral)  Resp 14  Ht 5\' 8"  (1.727 m)  Wt 154 lb 12.8 oz (70.217 kg)  BMI 23.54 kg/m2  SpO2 98%  Tobacco History  Smoking status  . Never Smoker   Smokeless tobacco  . Never Used     Counseling given: Not Answered   Past Medical History  Diagnosis Date  . Lymphocytic colitis   . Hypertension   . Cancer ([place]) 16 yrs ago    breast, left  . Ulcerative colitis ([place]) 2014  . Breast cancer ([place]) 02/10/1996    left, lumpectomy and radiation  . Varicose veins    Past Surgical History  Procedure Laterality Date  . Breast surgery Left 1998    lumpectomy, with radiation  . Breast surgery Right 2014    excision with wire localization  . Hernia repair    . Breast lumpectomy    . Neck surgery    . Dilation and curettage of uterus    . Peripheral vascular catheterization N/A 12/13/2014    Procedure: IVC Filter Insertion;  Surgeon: Algernon Huxley, MD;  Location: [facility];  Service: Cardiovascular;  Laterality: N/A;  . Peripheral vascular catheterization  12/13/2014    Procedure: Lower Extremity Intervention;  Surgeon: Algernon Huxley, MD;  Location: [facility];  Service: Cardiovascular;;   Family History  Problem Relation Age of Onset  . Heart disease Mother   . Hypertension Mother   . Cancer Mother     ovarian  . Ovarian cancer Mother 44  . Hypertension Sister   . Hypertension Brother   . Ovarian cancer Paternal Aunt 85   History  Sexual Activity  . Sexual Activity: No    Outpatient Encounter Prescriptions as of 01/30/2015  Medication Sig  . amLODipine (NORVASC) 5 MG tablet TAKE 1 TABLET EVERY DAY  .  brimonidine-timolol (COMBIGAN) 0.2-0.5 % ophthalmic solution Place 1 drop into both eyes every 12 (twelve) hours.    . budesonide (ENTOCORT EC) 3 MG 24 hr capsule Take 1 capsule (3 mg total) by mouth daily.  . cholecalciferol (VITAMIN D) 1000 UNITS tablet Take 1,000 Units by mouth 2 (two) times daily.  . clidinium-chlordiazePOXIDE (LIBRAX) 5-2.5 MG per capsule TAKE 1 CAPSULE EVERY DAY  . cyanocobalamin 500 MCG tablet Take 500 mcg by mouth daily.  . diphenoxylate-atropine (LOMOTIL) 2.5-0.025 MG tablet TAKE 1 TABLET BY MOUTH 4 TIMES A DAY AS NEEDED LOOSE STOOLS  . ELIQUIS 5 MG TABS tablet   . ESTRACE VAGINAL 0.1 MG/GM vaginal cream INSERT 1 APPLICATORFUL VAGINALLY AS DIRECTED  . metoprolol succinate (TOPROL-XL) 100 MG 24 hr tablet TAKE 1 TABLET EVERY DAY  . valsartan (DIOVAN) 320 MG tablet TAKE 1 TABLET DAILY  . [DISCONTINUED] gentamicin ointment (GARAMYCIN) 0.1 % Apply 1 application topically 3 (three) times daily.   No facility-administered encounter medications on file as of 01/30/2015.    Activities of Daily Living In your present state of health, do you have any difficulty performing the following activities: 01/30/2015  Hearing? N  Vision? N  Difficulty concentrating or making decisions? N  Walking or climbing stairs? N  Dressing or bathing?  N  Doing errands, shopping? N  Preparing Food and eating ? N  Using the Toilet? N  In the past six months, have you accidently leaked urine? Y  Do you have problems with loss of bowel control? Y  Managing your Medications? N  Managing your Finances? N  Housekeeping or managing your Housekeeping? N    Patient Care Team: Jackolyn Confer, MD as PCP - General (Internal Medicine) Jackolyn Confer, MD (Internal Medicine) Robert Bellow, MD (General Surgery) Robert Bellow, MD (General Surgery)    Assessment:    This is a routine wellness examination for Mia Perez. The goal of the wellness visit is to assist the patient how to close  the gaps in care and create a preventative care plan for the patient.   VIT D Calcium as appropriate/ Osteoporosis risk reviewed.  Medications reviewed; taking without issues or barriers.   ZOSTAVAX postponed, per patient request.   Safety issues reviewed; smoke detectors in the home. No firearms in the home. Wears seatbelts when driving or riding with others. No violence in the home.  No identified risk were noted; The patient was oriented x 3; appropriate in dress and manner and no objective failures at ADL's or IADL's.   Patient Concerns:Ultrasound prior to upcoming IVC Filter Removal procedure.  Deferred to PCP for follow up.  Exercise Activities and Dietary recommendations Current Exercise Habits:: Structured exercise class, Type of exercise: stretching (Line dancing), Time (Minutes): 60, Frequency (Times/Week): > 6, Weekly Exercise (Minutes/Week): 0, Intensity: Mild  Goals    . healthy lifestyle     Eat fresh fruits and vegetables, drinks plenty of water, socialize with friends more        Fall Risk Fall Risk  01/30/2015 12/20/2014 09/18/2013 09/18/2013 08/31/2012  Falls in the past year? No No No Yes Yes  Number falls in past yr: - - - 2 or more 2 or more  Risk for fall due to : - - - Impaired balance/gait -   Depression Screen PHQ 2/9 Scores 01/30/2015 12/20/2014 09/18/2013 09/18/2013  PHQ - 2 Score 0 0 0 0     Cognitive Testing MMSE - Mini Mental State Exam 01/30/2015  Orientation to time 5  Orientation to Place 5  Registration 3  Attention/ Calculation 5  Recall 3  Language- name 2 objects 2  Language- repeat 1  Language- follow 3 step command 3  Language- read & follow direction 1  Write a sentence 1  Copy design 1  Total score 30    Immunization History  Administered Date(s) Administered  . Influenza Split 02/23/2012  . Influenza,inj,Quad PF,36+ Mos 03/06/2013, 12/12/2013, 10/16/2014  . Pneumococcal Conjugate-13 03/06/2013  . Pneumococcal  Polysaccharide-23 08/05/2010  . Tdap 08/04/2009   Screening Tests Health Maintenance  Topic Date Due  . ZOSTAVAX  02/06/2016 (Originally 02/06/1996)  . INFLUENZA VACCINE  09/10/2015  . TETANUS/TDAP  08/05/2019  . DEXA SCAN  Completed  . PNA vac Low Risk Adult  Completed      Plan:   End of life planning; Advance aging; Advanced directives discussed. Copy requested of current HCPOA/Living Will.   Follow up with PCP as needed.  During the course of the visit the patient was educated and counseled about the following appropriate screening and preventive services:   Vaccines to include Pneumoccal, Influenza, Hepatitis B, Td, Zostavax, HCV  Electrocardiogram  Cardiovascular Disease  Colorectal cancer screening  Bone density screening  Diabetes screening  Glaucoma screening  Mammography/PAP  Nutrition  counseling   Patient Instructions (the written plan) was given to the patient.   Varney Biles, LPN  [id number]

## 2015-01-30 NOTE — Patient Instructions (Addendum)
Mia Perez,  Thank you for taking time to come for your Medicare Wellness Visit.  I appreciate your ongoing commitment to your health goals. Please review the following plan we discussed and let me know if I can assist you in the future.  Happy Holidays and Happy Early [place] Ouch!   Health Maintenance, Female Adopting a healthy lifestyle and getting preventive care can go a long way to promote health and wellness. Talk with your health care provider about what schedule of regular examinations is right for you. This is a good chance for you to check in with your provider about disease prevention and staying healthy. In between checkups, there are plenty of things you can do on your own. Experts have done a lot of research about which lifestyle changes and preventive measures are most likely to keep you healthy. Ask your health care provider for more information. WEIGHT AND DIET  Eat a healthy diet  Be sure to include plenty of vegetables, fruits, low-fat dairy products, and lean protein.  Do not eat a lot of foods high in solid fats, added sugars, or salt.  Get regular exercise. This is one of the most important things you can do for your health.  Most adults should exercise for at least 150 minutes each week. The exercise should increase your heart rate and make you sweat (moderate-intensity exercise).  Most adults should also do strengthening exercises at least twice a week. This is in addition to the moderate-intensity exercise.  Maintain a healthy weight  Body mass index (BMI) is a measurement that can be used to identify possible weight problems. It estimates body fat based on height and weight. Your health care provider can help determine your BMI and help you achieve or maintain a healthy weight.  For females 7 years of age and older:   A BMI below 18.5 is considered underweight.  A BMI of 18.5 to 24.9 is normal.  A BMI of 25 to 29.9 is considered overweight.  A BMI of 30 and  above is considered obese.  Watch levels of cholesterol and blood lipids  You should start having your blood tested for lipids and cholesterol at 79 years of age, then have this test every 5 years.  You may need to have your cholesterol levels checked more often if:  Your lipid or cholesterol levels are high.  You are older than 79 years of age.  You are at high risk for heart disease.  CANCER SCREENING   Lung Cancer  Lung cancer screening is recommended for adults 61-38 years old who are at high risk for lung cancer because of a history of smoking.  A yearly low-dose CT scan of the lungs is recommended for people who:  Currently smoke.  Have quit within the past 15 years.  Have at least a 30-pack-year history of smoking. A pack year is smoking an average of one pack of cigarettes a day for 1 year.  Yearly screening should continue until it has been 15 years since you quit.  Yearly screening should stop if you develop a health problem that would prevent you from having lung cancer treatment.  Breast Cancer  Practice breast self-awareness. This means understanding how your breasts normally appear and feel.  It also means doing regular breast self-exams. Let your health care provider know about any changes, no matter how small.  If you are in your 20s or 30s, you should have a clinical breast exam (CBE) by a health care provider  every 1-3 years as part of a regular health exam.  If you are 64 or older, have a CBE every year. Also consider having a breast X-ray (mammogram) every year.  If you have a family history of breast cancer, talk to your health care provider about genetic screening.  If you are at high risk for breast cancer, talk to your health care provider about having an MRI and a mammogram every year.  Breast cancer gene (BRCA) assessment is recommended for women who have family members with BRCA-related cancers. BRCA-related cancers  include:  Breast.  Ovarian.  Tubal.  Peritoneal cancers.  Results of the assessment will determine the need for genetic counseling and BRCA1 and BRCA2 testing. Cervical Cancer Your health care provider may recommend that you be screened regularly for cancer of the pelvic organs (ovaries, uterus, and vagina). This screening involves a pelvic examination, including checking for microscopic changes to the surface of your cervix (Pap test). You may be encouraged to have this screening done every 3 years, beginning at age 77.  For women ages 58-65, health care providers may recommend pelvic exams and Pap testing every 3 years, or they may recommend the Pap and pelvic exam, combined with testing for human papilloma virus (HPV), every 5 years. Some types of HPV increase your risk of cervical cancer. Testing for HPV may also be done on women of any age with unclear Pap test results.  Other health care providers may not recommend any screening for nonpregnant women who are considered low risk for pelvic cancer and who do not have symptoms. Ask your health care provider if a screening pelvic exam is right for you.  If you have had past treatment for cervical cancer or a condition that could lead to cancer, you need Pap tests and screening for cancer for at least 20 years after your treatment. If Pap tests have been discontinued, your risk factors (such as having a new sexual partner) need to be reassessed to determine if screening should resume. Some women have medical problems that increase the chance of getting cervical cancer. In these cases, your health care provider may recommend more frequent screening and Pap tests. Colorectal Cancer  This type of cancer can be detected and often prevented.  Routine colorectal cancer screening usually begins at 79 years of age and continues through 79 years of age.  Your health care provider may recommend screening at an earlier age if you have risk factors for  colon cancer.  Your health care provider may also recommend using home test kits to check for hidden blood in the stool.  A small camera at the end of a tube can be used to examine your colon directly (sigmoidoscopy or colonoscopy). This is done to check for the earliest forms of colorectal cancer.  Routine screening usually begins at age 86.  Direct examination of the colon should be repeated every 5-10 years through 79 years of age. However, you may need to be screened more often if early forms of precancerous polyps or small growths are found. Skin Cancer  Check your skin from head to toe regularly.  Tell your health care provider about any new moles or changes in moles, especially if there is a change in a mole's shape or color.  Also tell your health care provider if you have a mole that is larger than the size of a pencil eraser.  Always use sunscreen. Apply sunscreen liberally and repeatedly throughout the day.  Protect yourself by  wearing long sleeves, pants, a wide-brimmed hat, and sunglasses whenever you are outside. HEART DISEASE, DIABETES, AND HIGH BLOOD PRESSURE   High blood pressure causes heart disease and increases the risk of stroke. High blood pressure is more likely to develop in:  People who have blood pressure in the high end of the normal range (130-139/85-89 mm Hg).  People who are overweight or obese.  People who are African American.  If you are 31-28 years of age, have your blood pressure checked every 3-5 years. If you are 51 years of age or older, have your blood pressure checked every year. You should have your blood pressure measured twice--once when you are at a hospital or clinic, and once when you are not at a hospital or clinic. Record the average of the two measurements. To check your blood pressure when you are not at a hospital or clinic, you can use:  An automated blood pressure machine at a pharmacy.  A home blood pressure monitor.  If you  are between 76 years and 23 years old, ask your health care provider if you should take aspirin to prevent strokes.  Have regular diabetes screenings. This involves taking a blood sample to check your fasting blood sugar level.  If you are at a normal weight and have a low risk for diabetes, have this test once every three years after 79 years of age.  If you are overweight and have a high risk for diabetes, consider being tested at a younger age or more often. PREVENTING INFECTION  Hepatitis B  If you have a higher risk for hepatitis B, you should be screened for this virus. You are considered at high risk for hepatitis B if:  You were born in a country where hepatitis B is common. Ask your health care provider which countries are considered high risk.  Your parents were born in a high-risk country, and you have not been immunized against hepatitis B (hepatitis B vaccine).  You have HIV or AIDS.  You use needles to inject street drugs.  You live with someone who has hepatitis B.  You have had sex with someone who has hepatitis B.  You get hemodialysis treatment.  You take certain medicines for conditions, including cancer, organ transplantation, and autoimmune conditions. Hepatitis C  Blood testing is recommended for:  Everyone born from [birth year] through 1963.  Anyone with known risk factors for hepatitis C. Sexually transmitted infections (STIs)  You should be screened for sexually transmitted infections (STIs) including gonorrhea and chlamydia if:  You are sexually active and are younger than 79 years of age.  You are older than 79 years of age and your health care provider tells you that you are at risk for this type of infection.  Your sexual activity has changed since you were last screened and you are at an increased risk for chlamydia or gonorrhea. Ask your health care provider if you are at risk.  If you do not have HIV, but are at risk, it may be recommended that you  take a prescription medicine daily to prevent HIV infection. This is called pre-exposure prophylaxis (PrEP). You are considered at risk if:  You are sexually active and do not regularly use condoms or know the HIV status of your partner(s).  You take drugs by injection.  You are sexually active with a partner who has HIV. Talk with your health care provider about whether you are at high risk of being infected with HIV. If  you choose to begin PrEP, you should first be tested for HIV. You should then be tested every 3 months for as long as you are taking PrEP.  PREGNANCY   If you are premenopausal and you may become pregnant, ask your health care provider about preconception counseling.  If you may become pregnant, take 400 to 800 micrograms (mcg) of folic acid every day.  If you want to prevent pregnancy, talk to your health care provider about birth control (contraception). OSTEOPOROSIS AND MENOPAUSE   Osteoporosis is a disease in which the bones lose minerals and strength with aging. This can result in serious bone fractures. Your risk for osteoporosis can be identified using a bone density scan.  If you are 40 years of age or older, or if you are at risk for osteoporosis and fractures, ask your health care provider if you should be screened.  Ask your health care provider whether you should take a calcium or vitamin D supplement to lower your risk for osteoporosis.  Menopause may have certain physical symptoms and risks.  Hormone replacement therapy may reduce some of these symptoms and risks. Talk to your health care provider about whether hormone replacement therapy is right for you.  HOME CARE INSTRUCTIONS   Schedule regular health, dental, and eye exams.  Stay current with your immunizations.   Do not use any tobacco products including cigarettes, chewing tobacco, or electronic cigarettes.  If you are pregnant, do not drink alcohol.  If you are breastfeeding, limit how  much and how often you drink alcohol.  Limit alcohol intake to no more than 1 drink per day for nonpregnant women. One drink equals 12 ounces of beer, 5 ounces of wine, or 1 ounces of hard liquor.  Do not use street drugs.  Do not share needles.  Ask your health care provider for help if you need support or information about quitting drugs.  Tell your health care provider if you often feel depressed.  Tell your health care provider if you have ever been abused or do not feel safe at home.   This information is not intended to replace advice given to you by your health care provider. Make sure you discuss any questions you have with your health care provider.   Document Released: 08/11/2010 Document Revised: 02/16/2014 Document Reviewed: 12/28/2012 Elsevier Interactive Patient Education [employer].

## 2015-01-30 NOTE — Telephone Encounter (Signed)
Pt said she was called after seeing Dr. Gilford Rile , for an another ultrasound prior to procedure on 02/13/14.  She is requesting a follow up phone call [phone] as to if she is supposed to have this done and when.

## 2015-01-30 NOTE — Progress Notes (Signed)
Annual Wellness Visit as completed by [facility] was reviewed in full.  

## 2015-01-31 ENCOUNTER — Other Ambulatory Visit: Payer: Self-pay | Admitting: Internal Medicine

## 2015-01-31 DIAGNOSIS — I82401 Acute embolism and thrombosis of unspecified deep veins of right lower extremity: Secondary | ICD-10-CM

## 2015-01-31 NOTE — Telephone Encounter (Signed)
OK Order placed

## 2015-01-31 NOTE — Telephone Encounter (Signed)
Please contact pt with information on [place]

## 2015-02-06 DIAGNOSIS — I824Y9 Acute embolism and thrombosis of unspecified deep veins of unspecified proximal lower extremity: Secondary | ICD-10-CM | POA: Diagnosis not present

## 2015-02-06 DIAGNOSIS — I80291 Phlebitis and thrombophlebitis of other deep vessels of right lower extremity: Secondary | ICD-10-CM | POA: Diagnosis not present

## 2015-02-06 DIAGNOSIS — I80211 Phlebitis and thrombophlebitis of right iliac vein: Secondary | ICD-10-CM | POA: Diagnosis not present

## 2015-02-06 DIAGNOSIS — I824Z1 Acute embolism and thrombosis of unspecified deep veins of right distal lower extremity: Secondary | ICD-10-CM | POA: Diagnosis not present

## 2015-02-06 DIAGNOSIS — M7989 Other specified soft tissue disorders: Secondary | ICD-10-CM | POA: Diagnosis not present

## 2015-02-10 HISTORY — PX: BREAST LUMPECTOMY: SHX2

## 2015-02-20 ENCOUNTER — Encounter
Admission: RE | Disposition: A | Discharge status: Home / Self Care | Payer: Self-pay | Source: Ambulatory Visit | Attending: Vascular Surgery

## 2015-02-20 ENCOUNTER — Ambulatory Visit
Admission: RE | Admit: 2015-02-20 | Discharge: 2015-02-20 | Disposition: A | Discharge status: Home / Self Care | Payer: Medicare Other | Source: Ambulatory Visit | Attending: Vascular Surgery | Admitting: Vascular Surgery

## 2015-02-20 ENCOUNTER — Encounter: Payer: Self-pay | Admitting: *Deleted

## 2015-02-20 DIAGNOSIS — I868 Varicose veins of other specified sites: Secondary | ICD-10-CM | POA: Diagnosis not present

## 2015-02-20 DIAGNOSIS — Z86718 Personal history of other venous thrombosis and embolism: Secondary | ICD-10-CM | POA: Diagnosis not present

## 2015-02-20 DIAGNOSIS — I10 Essential (primary) hypertension: Secondary | ICD-10-CM | POA: Diagnosis not present

## 2015-02-20 DIAGNOSIS — Z7901 Long term (current) use of anticoagulants: Secondary | ICD-10-CM | POA: Diagnosis not present

## 2015-02-20 DIAGNOSIS — C50919 Malignant neoplasm of unspecified site of unspecified female breast: Secondary | ICD-10-CM | POA: Insufficient documentation

## 2015-02-20 DIAGNOSIS — Z79899 Other long term (current) drug therapy: Secondary | ICD-10-CM | POA: Diagnosis not present

## 2015-02-20 DIAGNOSIS — Z452 Encounter for adjustment and management of vascular access device: Secondary | ICD-10-CM | POA: Insufficient documentation

## 2015-02-20 HISTORY — DX: Acute embolism and thrombosis of unspecified deep veins of unspecified lower extremity: I82.409

## 2015-02-20 HISTORY — PX: PERIPHERAL VASCULAR CATHETERIZATION: SHX172C

## 2015-02-20 SURGERY — IVC FILTER REMOVAL
Anesthesia: Moderate Sedation

## 2015-02-20 MED ORDER — HEPARIN (PORCINE) IN NACL 2-0.9 UNIT/ML-% IJ SOLN
INTRAMUSCULAR | Status: AC
  Filled 2015-02-20: qty 500

## 2015-02-20 MED ORDER — FENTANYL CITRATE (PF) 100 MCG/2ML IJ SOLN
INTRAMUSCULAR | Status: DC | PRN
  Administered 2015-02-20: 50 ug via INTRAVENOUS

## 2015-02-20 MED ORDER — IOHEXOL 300 MG/ML  SOLN
INTRAMUSCULAR | Status: DC | PRN
  Administered 2015-02-20: 10 mL

## 2015-02-20 MED ORDER — MIDAZOLAM HCL 2 MG/2ML IJ SOLN
INTRAMUSCULAR | Status: DC | PRN
  Administered 2015-02-20: 2 mg via INTRAVENOUS

## 2015-02-20 MED ORDER — MIDAZOLAM HCL 2 MG/2ML IJ SOLN
INTRAMUSCULAR | Status: AC
  Filled 2015-02-20: qty 2

## 2015-02-20 MED ORDER — LIDOCAINE-EPINEPHRINE (PF) 1 %-1:200000 IJ SOLN
INTRAMUSCULAR | Status: AC
  Filled 2015-02-20: qty 30

## 2015-02-20 MED ORDER — FENTANYL CITRATE (PF) 100 MCG/2ML IJ SOLN
INTRAMUSCULAR | Status: AC
  Filled 2015-02-20: qty 2

## 2015-02-20 MED ORDER — SODIUM CHLORIDE 0.9 % IV SOLN
INTRAVENOUS | Status: DC
  Administered 2015-02-20: 13:00:00 via INTRAVENOUS

## 2015-02-20 SURGICAL SUPPLY — 3 items
PACK ANGIOGRAPHY (CUSTOM PROCEDURE TRAY) ×2 IMPLANT
SET VENACAVA FILTER RETRIEVAL (MISCELLANEOUS) ×2 IMPLANT
WIRE J 3MM .035X145CM (WIRE) ×2 IMPLANT

## 2015-02-20 NOTE — H&P (Signed)
  [facility] History & Physical Update  The patient was interviewed and re-examined.  The patient's previous History and Physical has been reviewed and is unchanged.  There is no change in the plan of care. We plan to proceed with the scheduled procedure.  Dayton Kenley, MD  02/20/2015, 3:29 PM

## 2015-02-20 NOTE — Discharge Instructions (Signed)

## 2015-02-20 NOTE — Op Note (Signed)
    OPERATIVE NOTE   PROCEDURE: 1. Ultrasound guidance for vascular access to right jugular vein. 2. Catheter placement into IVC from right jugular vein. 3. Inferior venacavogram. 4. Retrieval of IVC filter.  PRE-OPERATIVE DIAGNOSIS: 1. Status post IVC filter for previous DVT    POST-OPERATIVE DIAGNOSIS: Same as above  SURGEON: Leotis Pain, MD  ASSISTANT(S): None  ANESTHESIA: local with moderate conscious sedation, 2 mg of Versed, 50 mcg of Fentanyl for about 10 minutes  ESTIMATED BLOOD LOSS: minimal  FINDING(S): 1. Patent IVC  SPECIMEN(S): Filter retrieved and disposed of  INDICATIONS:  Patient is a 82 year old female who presents with a previous history of IVC filter placement for extensive DVT and previous thrombolysis and thrombectomy.  The patient desires removal of the filter to avoid the small lifetime risks of perforation, infection, thrombosis, migration, and stent fracture.  Risks and benefits of removal were discussed and the patient is agreeable to proceed.  The patient understands that the filter may not be able to be retrieved if the top of the filter has embedded in the caval wall.   DESCRIPTION: After obtaining full informed written consent, the patient was brought back to the special procedure room and placed supine upon the operating table. She was connected to pulse oxymetry, telemetry and monitored throughout the procedure under my supervision.  After obtaining adequate sedation, the patient was prepped and draped in the standard fashion. The right jugular vein was visualized with ultrasound and found to be widely patent. This was accessed under direct ultrasound guidance with a Seldinger needle and a permanent image was recorded. A J-wire was then placed. After skin nick and dilatation, the retrieval sheath was placed over the wire into the inferior vena cava. Inferior venacavogram was then performed. The IVC was found to be widely patent and the filter was in  good location. I then was able to snare the hook on the top of the filter and advanced the sheath over the filter collapsing it and bringing it into the sheath in its entirety. It was then removed through the sheath in its entirety. The sheath was then removed and pressure was held on the neck. The patient tolerated the procedure well and was taken to the recovery room in stable condition.  COMPLICATIONS: None  CONDITION: Stable   Mia Perez 02/20/2015 4:18 PM

## 2015-02-21 ENCOUNTER — Encounter: Payer: Self-pay | Admitting: Vascular Surgery

## 2015-02-25 NOTE — H&P (Signed)
[facility] Admission History & Physical  MRN : [id number]  Mia Perez is a 82 y.o. (1933-02-27) female who presents with chief complaint of No chief complaint on file. Marland Kitchen  History of Present Illness: Patient is a 82 year old female who had an IVC filter placed as part of a thrombectomy procedure several months ago. She has mild postphlebitic symptoms but her DVT is now markedly improved and although remains is some chronic appearing DVT. She feels well today. She has no evidence of pulmonary embolus. She desires to have the filter removed to avoid long-term complications.  No current facility-administered medications for this encounter.   Current Outpatient Prescriptions  Medication Sig Dispense Refill  . amLODipine (NORVASC) 5 MG tablet TAKE 1 TABLET EVERY DAY 90 tablet 1  . brimonidine-timolol (COMBIGAN) 0.2-0.5 % ophthalmic solution Place 1 drop into both eyes every 12 (twelve) hours.      . budesonide (ENTOCORT EC) 3 MG 24 hr capsule Take 1 capsule (3 mg total) by mouth daily. 90 capsule 3  . cholecalciferol (VITAMIN D) 1000 UNITS tablet Take 1,000 Units by mouth 2 (two) times daily.    . clidinium-chlordiazePOXIDE (LIBRAX) 5-2.5 MG per capsule TAKE 1 CAPSULE EVERY DAY 90 capsule 0  . diphenoxylate-atropine (LOMOTIL) 2.5-0.025 MG tablet TAKE 1 TABLET BY MOUTH 4 TIMES A DAY AS NEEDED LOOSE STOOLS 360 tablet 0  . ELIQUIS 5 MG TABS tablet     . ESTRACE VAGINAL 0.1 MG/GM vaginal cream INSERT 1 APPLICATORFUL VAGINALLY AS DIRECTED 42.5 g 1  . metoprolol succinate (TOPROL-XL) 100 MG 24 hr tablet TAKE 1 TABLET EVERY DAY 90 tablet 0  . valsartan (DIOVAN) 320 MG tablet TAKE 1 TABLET DAILY 90 tablet 3  . cyanocobalamin 500 MCG tablet Take 500 mcg by mouth daily. Reported on 02/20/2015      Past Medical History  Diagnosis Date  . Lymphocytic colitis   . Hypertension   . Cancer ([place]) 16 yrs ago    breast, left  . Ulcerative colitis ([place]) 2014  . Breast cancer  ([place]) 02/10/1996    left, lumpectomy and radiation  . Varicose veins   . DVT (deep venous thrombosis) [facility])     Past Surgical History  Procedure Laterality Date  . Breast surgery Left 1998    lumpectomy, with radiation  . Breast surgery Right 2014    excision with wire localization  . Hernia repair    . Breast lumpectomy    . Neck surgery    . Dilation and curettage of uterus    . Peripheral vascular catheterization N/A 12/13/2014    Procedure: IVC Filter Insertion;  Surgeon: Algernon Huxley, MD;  Location: [facility];  Service: Cardiovascular;  Laterality: N/A;  . Peripheral vascular catheterization  12/13/2014    Procedure: Lower Extremity Intervention;  Surgeon: Algernon Huxley, MD;  Location: [facility];  Service: Cardiovascular;;  . Peripheral vascular catheterization N/A 02/20/2015    Procedure: IVC Filter Removal;  Surgeon: Algernon Huxley, MD;  Location: [facility];  Service: Cardiovascular;  Laterality: N/A;    Social History Social History  Substance Use Topics  . Smoking status: Never Smoker   . Smokeless tobacco: Never Used  . Alcohol Use: No   no IV drug use  Family History Family History  Problem Relation Age of Onset  . Heart disease Mother   . Hypertension Mother   . Cancer Mother     ovarian  . Ovarian  cancer Mother 70  . Hypertension Sister   . Hypertension Brother   . Ovarian cancer Paternal Aunt 27    Allergies  Allergen Reactions  . Penicillins Rash     REVIEW OF SYSTEMS (Negative unless checked)  Constitutional: [] Weight loss  [] Fever  [] Chills Cardiac: [] Chest pain   [] Chest pressure   [] Palpitations   [] Shortness of breath when laying flat   [] Shortness of breath at rest   [] Shortness of breath with exertion. Vascular:  [] Pain in legs with walking   [] Pain in legs at rest   [] Pain in legs when laying flat   [] Claudication   [] Pain in feet when walking  [] Pain in feet at rest  [] Pain in feet when laying flat   [x] History of  DVT   [x] Phlebitis   [x] Swelling in legs   [] Varicose veins   [] Non-healing ulcers Pulmonary:   [] Uses home oxygen   [] Productive cough   [] Hemoptysis   [] Wheeze  [] COPD   [] Asthma Neurologic:  [] Dizziness  [] Blackouts   [] Seizures   [] History of stroke   [] History of TIA  [] Aphasia   [] Temporary blindness   [] Dysphagia   [] Weakness or numbness in arms   [] Weakness or numbness in legs Musculoskeletal:  [] Arthritis   [] Joint swelling   [] Joint pain   [] Low back pain Hematologic:  [] Easy bruising  [] Easy bleeding   [] Hypercoagulable state   [] Anemic  [] Hepatitis Gastrointestinal:  [] Blood in stool   [] Vomiting blood  [] Gastroesophageal reflux/heartburn   [] Difficulty swallowing. Genitourinary:  [] Chronic kidney disease   [] Difficult urination  [] Frequent urination  [] Burning with urination   [] Blood in urine Skin:  [] Rashes   [] Ulcers   [] Wounds Psychological:  [] History of anxiety   []  History of major depression.  Physical Examination  Filed Vitals:   02/20/15 1630 02/20/15 1640 02/20/15 1645 02/20/15 1655  BP: 119/77 132/71 123/67 124/67  Pulse: 82  119 77  Temp:      TempSrc:      Resp: 16  17 20   Height:      Weight:      SpO2: 94%  86% 100%   Body mass index is 24.11 kg/(m^2). Gen: WD/WN, NAD appears younger than stated age. Head: [place]/AT, No temporalis wasting. Prominent temp pulse not noted. Ear/Nose/Throat: Hearing grossly intact, nares w/o erythema or drainage, oropharynx w/o Erythema/Exudate,  Eyes: PERRLA, EOMI.  Neck: Supple, no nuchal rigidity.  No bruit or JVD.  Pulmonary:  Good air movement, clear to auscultation bilaterally, no use of accessory muscles.  Cardiac: RRR, normal S1, S2, no Murmurs, rubs or gallops. Vascular:  Vessel Right Left  Radial Palpable Palpable  Ulnar Palpable Palpable  Brachial Palpable Palpable  Carotid Palpable, without bruit Palpable, without bruit  Aorta Not palpable N/A  Femoral Palpable Palpable  Popliteal Palpable Palpable  PT Palpable  Palpable  DP Palpable Palpable   Gastrointestinal: soft, non-tender/non-distended. No guarding/reflex.  Musculoskeletal: M/S 5/5 throughout.  Extremities without ischemic changes.  No deformity or atrophy. Mild lower extremity edema present bilaterally. Neurologic: CN 2-12 intact. Pain and light touch intact in extremities.  Symmetrical.  Speech is fluent. Motor exam as listed above. Psychiatric: Judgment intact, Mood & affect appropriate for pt's clinical situation. Dermatologic: No rashes or ulcers noted.  No cellulitis or open wounds. Lymph : No Cervical, Axillary, or Inguinal lymphadenopathy.     CBC Lab Results  Component Value Date   WBC 8.8 09/14/2014   HGB 12.8 09/14/2014   HCT 38.2 09/14/2014   MCV  89.7 09/14/2014   PLT 453.0* 09/14/2014    BMET    Component Value Date/Time   NA 139 09/14/2014 1031   K 4.4 09/14/2014 1031   CL 107 09/14/2014 1031   CO2 24 09/14/2014 1031   GLUCOSE 105* 09/14/2014 1031   BUN 26* 12/13/2014 0937   CREATININE 1.17* 12/13/2014 0937   CALCIUM 9.5 09/14/2014 1031   GFRNONAA 43* 12/13/2014 0937   GFRAA 50* 12/13/2014 0937   CrCl cannot be calculated (Patient has no serum creatinine result on file.).  COAG No results found for: INR, PROTIME  Radiology No results found.    Assessment/Plan 1. S/p ivc filter placement.  She desires removal to avoid long-term complications of the filter. Risks and benefits were discussed. She is agreeable to proceed. 2. Extensive lower extremity DVT. Status post IVC filter and thrombectomy. Postphlebitic symptoms are reasonably mild. Leg elevation and compression stockings as needed.   Berneda Piccininni, MD  02/25/2015 9:57 AM

## 2015-03-05 ENCOUNTER — Telehealth: Payer: Self-pay | Admitting: Internal Medicine

## 2015-03-05 NOTE — Telephone Encounter (Signed)
Mia Perez called from [place] [facility] regarding pt medication that needs formulary exception clidinium-chlordiazePOXIDE (LIBRAX) 5-2.5 MG per capsule and ESTRACE VAGINAL 0.1 MG/GM vaginal cream needs prior auth it can be done over the phone [phone] hours M-Fri 5am-7pm [place] time sat 6am-3pm. Thank you!

## 2015-03-05 NOTE — Telephone Encounter (Signed)
Do you have a prior auth started on her? Please advise

## 2015-03-05 NOTE — Telephone Encounter (Signed)
Fax being sent.

## 2015-03-05 NOTE — Telephone Encounter (Signed)
I don't please have them fax information over.  Thanks

## 2015-03-12 ENCOUNTER — Telehealth: Payer: Self-pay | Admitting: Internal Medicine

## 2015-03-12 NOTE — Telephone Encounter (Signed)
Pt came in and dropped off a denial of Medicare Prescription drug coverage for one of her medication and a letter.. Placed in Dr. Derry Skill Box.. Please advise pt.. Pt only wanted to give this to Dr. Gilford Rile.Marland Kitchen

## 2015-03-14 NOTE — Telephone Encounter (Signed)
Spoke with pt asked her to bring in all of the copies of the information that was sent to her.

## 2015-03-14 NOTE — Telephone Encounter (Signed)
Pt came in and dropped off all of the paperwork you asked for.. Placed in Dr. Derry Skill box

## 2015-03-18 NOTE — Telephone Encounter (Signed)
Appeal for Eliqus submitted via fax.

## 2015-05-29 ENCOUNTER — Other Ambulatory Visit: Payer: Self-pay | Admitting: Internal Medicine

## 2015-06-03 NOTE — Telephone Encounter (Signed)
Needs appointment

## 2015-06-06 ENCOUNTER — Telehealth: Payer: Self-pay | Admitting: Internal Medicine

## 2015-06-06 MED ORDER — METOPROLOL SUCCINATE ER 100 MG PO TB24: 100.0000 mg | ORAL_TABLET | Freq: Every day | ORAL | Status: DC

## 2015-06-06 NOTE — Telephone Encounter (Signed)
Pt called in about needing a refill for metoprolol succinate (TOPROL-XL) 100 MG 24 hr tablet. Pharmacy is [id number] [place], [place] - [address]. Call pt @ [phone]. Thank you!

## 2015-06-06 NOTE — Telephone Encounter (Signed)
Sent to the pharmacy. thanks

## 2015-06-10 ENCOUNTER — Encounter: Payer: Self-pay | Admitting: Internal Medicine

## 2015-06-10 ENCOUNTER — Ambulatory Visit (INDEPENDENT_AMBULATORY_CARE_PROVIDER_SITE_OTHER): Payer: Medicare Other | Admitting: Internal Medicine

## 2015-06-10 VITALS — BP 130/74 | HR 66 | Ht 67.0 in | Wt 154.0 lb

## 2015-06-10 DIAGNOSIS — I10 Essential (primary) hypertension: Secondary | ICD-10-CM

## 2015-06-10 DIAGNOSIS — I82511 Chronic embolism and thrombosis of right femoral vein: Secondary | ICD-10-CM | POA: Diagnosis not present

## 2015-06-10 DIAGNOSIS — K52832 Lymphocytic colitis: Secondary | ICD-10-CM | POA: Diagnosis not present

## 2015-06-10 MED ORDER — AMLODIPINE BESYLATE 5 MG PO TABS: 5.0000 mg | ORAL_TABLET | Freq: Every day | ORAL | Status: DC

## 2015-06-10 MED ORDER — DIPHENOXYLATE-ATROPINE 2.5-0.025 MG PO TABS: ORAL_TABLET | ORAL | Status: DC

## 2015-06-10 MED ORDER — VALSARTAN 320 MG PO TABS: 320.0000 mg | ORAL_TABLET | Freq: Every day | ORAL | Status: DC

## 2015-06-10 MED ORDER — CILIDINIUM-CHLORDIAZEPOXIDE 2.5-5 MG PO CAPS: 1.0000 | ORAL_CAPSULE | Freq: Every day | ORAL | Status: DC

## 2015-06-10 NOTE — Assessment & Plan Note (Signed)
Symptoms well controlled with Budesonide and prn Lomotil.  Will continue. 

## 2015-06-10 NOTE — Progress Notes (Signed)
Subjective:    [name]: Mia Perez, female    DOB: 07-03-1933, 82 y.o.   MRN: [id number]  HPI  82YO female presents for follow up.  Diarrhea - Symptoms generally well controlled with Budesonide and prn Lomotil. No recent episodes of diarrhea. No abdominal pain. No constipation.  HTN - Compliant with medication. No CP, HA.  On Eliquis after DVT. No recent issues with bleeding or bruising.   Wt Readings from Last 3 Encounters:  06/10/15 154 lb (69.854 kg)  02/20/15 154 lb (69.854 kg)  01/30/15 154 lb 12.8 oz (70.217 kg)   BP Readings from Last 3 Encounters:  06/10/15 130/74  02/20/15 124/67  01/30/15 122/76    Past Medical History  Diagnosis Date  . Lymphocytic colitis   . Hypertension   . Cancer ([facility]) 16 yrs ago    breast, left  . Ulcerative colitis ([facility]) 2014  . Breast cancer ([facility]) 02/10/1996    left, lumpectomy and radiation  . Varicose veins   . DVT (deep venous thrombosis) ([facility])    Family History  Problem Relation Age of Onset  . Heart disease Mother   . Hypertension Mother   . Cancer Mother     ovarian  . Ovarian cancer Mother 10  . Hypertension Sister   . Hypertension Brother   . Ovarian cancer Paternal Aunt 1   Past Surgical History  Procedure Laterality Date  . Breast surgery Left 1998    lumpectomy, with radiation  . Breast surgery Right 2014    excision with wire localization  . Hernia repair    . Breast lumpectomy    . Neck surgery    . Dilation and curettage of uterus    . Peripheral vascular catheterization N/A 12/13/2014    Procedure: IVC Filter Insertion;  Surgeon: Algernon Huxley, MD;  Location: [facility];  Service: Cardiovascular;  Laterality: N/A;  . Peripheral vascular catheterization  12/13/2014    Procedure: Lower Extremity Intervention;  Surgeon: Algernon Huxley, MD;  Location: [facility];  Service: Cardiovascular;;  . Peripheral vascular catheterization N/A 02/20/2015    Procedure: IVC Filter Removal;  Surgeon:  Algernon Huxley, MD;  Location: [facility];  Service: Cardiovascular;  Laterality: N/A;   Social History   Social History  . Marital Status: Widowed    Spouse Name: N/A  . Number of Children: 1  . Years of Education: N/A   Occupational History  . Retired     Social History Main Topics  . Smoking status: Never Smoker   . Smokeless tobacco: Never Used  . Alcohol Use: No  . Drug Use: No  . Sexual Activity: No   Other Topics Concern  . None   Social History Narrative   Lives in [place]. Husband passed away 28. [name], daughter.      Work - [employer] 13 years, then [employer] - regular    Review of Systems  Constitutional: Negative for fever, chills, appetite change, fatigue and unexpected weight change.  Eyes: Negative for visual disturbance.  Respiratory: Negative for cough and shortness of breath.   Cardiovascular: Negative for chest pain, palpitations and leg swelling.  Gastrointestinal: Negative for nausea, vomiting, abdominal pain, diarrhea and constipation.  Musculoskeletal: Negative for myalgias and arthralgias.  Skin: Negative for color change and rash.  Hematological: Negative for adenopathy. Does not bruise/bleed easily.  Psychiatric/Behavioral: Negative  for sleep disturbance and dysphoric mood. The patient is not nervous/anxious.        Objective:    BP 130/74 mmHg  Pulse 66  Ht _0  (1.702 m)  Wt 154 lb (69.854 kg)  BMI 24.11 kg/m2  SpO2 97% Physical Exam  Constitutional: She is oriented to person, place, and time. She appears well-developed and well-nourished. No distress.  HENT:  Head: Normocephalic and atraumatic.  Right Ear: External ear normal.  Left Ear: External ear normal.  Nose: Nose normal.  Mouth/Throat: Oropharynx is clear and moist. No oropharyngeal exudate.  Eyes: Conjunctivae are normal. Pupils are equal, round, and reactive to light. Right eye exhibits no discharge. Left eye  exhibits no discharge. No scleral icterus.  Neck: Normal range of motion. Neck supple. No tracheal deviation present. No thyromegaly present.  Cardiovascular: Normal rate, regular rhythm, normal heart sounds and intact distal pulses.  Exam reveals no gallop and no friction rub.   No murmur heard. Pulmonary/Chest: Effort normal and breath sounds normal. No respiratory distress. She has no wheezes. She has no rales. She exhibits no tenderness.  Musculoskeletal: Normal range of motion. She exhibits no edema or tenderness.  Lymphadenopathy:    She has no cervical adenopathy.  Neurological: She is alert and oriented to person, place, and time. No cranial nerve deficit. She exhibits normal muscle tone. Coordination normal.  Skin: Skin is warm and dry. No rash noted. She is not diaphoretic. No erythema. No pallor.  Psychiatric: She has a normal mood and affect. Her behavior is normal. Judgment and thought content normal.          Assessment & Plan:   Problem List Items Addressed This Visit      Unprioritized   DVT (deep venous thrombosis) ([facility])    On Eliquis for h/o DVT. Tolerating well. Will continue.      Relevant Medications   amLODipine (NORVASC) 5 MG tablet   valsartan (DIOVAN) 320 MG tablet   Hypertension    BP Readings from Last 3 Encounters:  06/10/15 130/74  02/20/15 124/67  01/30/15 122/76   BP well controlled. Renal function with labs. Continue current medications.      Relevant Medications   amLODipine (NORVASC) 5 MG tablet   valsartan (DIOVAN) 320 MG tablet   Other Relevant Orders   Comp Met (CMET)   Lymphocytic colitis - Primary    Symptoms well controlled with Budesonide and prn Lomotil. Will continue.          Return in about 6 months (around 12/11/2015) for Recheck.  Ronette Deter, MD Internal Medicine [facility]

## 2015-06-10 NOTE — Assessment & Plan Note (Signed)
On Eliquis for h/o DVT. Tolerating well. Will continue.

## 2015-06-10 NOTE — Assessment & Plan Note (Signed)
BP Readings from Last 3 Encounters:  06/10/15 130/74  02/20/15 124/67  01/30/15 122/76   BP well controlled. Renal function with labs. Continue current medications.

## 2015-06-10 NOTE — Patient Instructions (Addendum)
Continue current medication.  Follow up in 6 months and sooner as needed.

## 2015-06-11 ENCOUNTER — Telehealth: Payer: Self-pay

## 2015-06-11 LAB — COMPREHENSIVE METABOLIC PANEL
ALT: 13 U/L (ref 0–35)
AST: 15 U/L (ref 0–37)
Albumin: 3.9 g/dL (ref 3.5–5.2)
Alkaline Phosphatase: 69 U/L (ref 39–117)
BILIRUBIN TOTAL: 0.3 mg/dL (ref 0.2–1.2)
BUN: 29 mg/dL — AB (ref 6–23)
CO2: 25 meq/L (ref 19–32)
Calcium: 9.2 mg/dL (ref 8.4–10.5)
Chloride: 106 mEq/L (ref 96–112)
Creatinine, Ser: 1.43 mg/dL — ABNORMAL HIGH (ref 0.40–1.20)
GFR: 37.59 mL/min — AB (ref >60.00)
GLUCOSE: 88 mg/dL (ref 70–99)
Potassium: 4.2 mEq/L (ref 3.5–5.1)
SODIUM: 139 meq/L (ref 135–145)
TOTAL PROTEIN: 6.9 g/dL (ref 6.0–8.3)

## 2015-06-11 NOTE — Telephone Encounter (Signed)
Unable to leave message phone just kept ringing, will try again later.

## 2015-06-11 NOTE — Telephone Encounter (Signed)
-----   Message from Jackolyn Confer, MD sent at 06/11/2015 12:23 PM EDT ----- Labs show a slight decline in kidney function compared to previous. This may be in part due to dehydration. I would recommend increased fluid intake and then repeat BMP in 1 week.

## 2015-06-12 NOTE — Telephone Encounter (Signed)
Patient aware of lab results.  Lab appointment made.  Advised patient to increase fluids.

## 2015-06-12 NOTE — Telephone Encounter (Signed)
-----   Message from Jackolyn Confer, MD sent at 06/11/2015 12:23 PM EDT ----- Labs show a slight decline in kidney function compared to previous. This may be in part due to dehydration. I would recommend increased fluid intake and then repeat BMP in 1 week.

## 2015-06-19 ENCOUNTER — Other Ambulatory Visit (INDEPENDENT_AMBULATORY_CARE_PROVIDER_SITE_OTHER): Payer: Medicare Other

## 2015-06-19 DIAGNOSIS — E860 Dehydration: Secondary | ICD-10-CM | POA: Diagnosis not present

## 2015-06-19 LAB — BASIC METABOLIC PANEL
BUN: 29 mg/dL — AB (ref 6–23)
CHLORIDE: 104 meq/L (ref 96–112)
CO2: 28 mEq/L (ref 19–32)
Calcium: 9.7 mg/dL (ref 8.4–10.5)
Creatinine, Ser: 1.29 mg/dL — ABNORMAL HIGH (ref 0.40–1.20)
GFR: 42.33 mL/min — ABNORMAL LOW (ref >60.00)
GLUCOSE: 93 mg/dL (ref 70–99)
POTASSIUM: 3.8 meq/L (ref 3.5–5.1)
SODIUM: 140 meq/L (ref 135–145)

## 2015-07-17 ENCOUNTER — Ambulatory Visit: Payer: Medicare PPO | Admitting: Internal Medicine

## 2015-08-20 ENCOUNTER — Other Ambulatory Visit: Payer: Self-pay | Admitting: Internal Medicine

## 2015-09-03 ENCOUNTER — Other Ambulatory Visit: Payer: Self-pay | Admitting: Internal Medicine

## 2015-09-03 NOTE — Telephone Encounter (Signed)
LOV 06/10/2015. Mia Perez, CMA

## 2015-09-03 NOTE — Telephone Encounter (Signed)
Will need to be sent to another provider

## 2015-09-04 NOTE — Telephone Encounter (Signed)
Refill request for Lomotil, last seen [id number], last filled [id number].  Please advise.

## 2015-09-10 NOTE — Telephone Encounter (Signed)
Refill authorized,  Will be in to sign this afternoon unless you would like to call it in .

## 2015-11-04 ENCOUNTER — Other Ambulatory Visit: Payer: Self-pay | Admitting: Family Medicine

## 2015-11-04 DIAGNOSIS — Z1231 Encounter for screening mammogram for malignant neoplasm of breast: Secondary | ICD-10-CM

## 2015-11-28 ENCOUNTER — Ambulatory Visit
Admission: RE | Admit: 2015-11-28 | Discharge: 2015-11-28 | Disposition: A | Discharge status: Home / Self Care | Payer: Medicare Other | Source: Ambulatory Visit | Attending: Family Medicine | Admitting: Family Medicine

## 2015-11-28 DIAGNOSIS — R928 Other abnormal and inconclusive findings on diagnostic imaging of breast: Secondary | ICD-10-CM | POA: Diagnosis not present

## 2015-11-28 DIAGNOSIS — Z1231 Encounter for screening mammogram for malignant neoplasm of breast: Secondary | ICD-10-CM | POA: Insufficient documentation

## 2015-11-28 HISTORY — DX: Personal history of irradiation: Z92.3

## 2015-11-29 ENCOUNTER — Telehealth: Payer: Self-pay | Admitting: *Deleted

## 2015-11-29 NOTE — Telephone Encounter (Signed)
Pt was given information.

## 2015-11-29 NOTE — Telephone Encounter (Signed)
Pt requested mammogram results  Pt contact :[phone]

## 2015-12-02 ENCOUNTER — Other Ambulatory Visit: Payer: Self-pay | Admitting: Family Medicine

## 2015-12-02 DIAGNOSIS — N6310 Unspecified lump in the right breast, unspecified quadrant: Secondary | ICD-10-CM

## 2015-12-11 ENCOUNTER — Ambulatory Visit
Admission: RE | Admit: 2015-12-11 | Discharge: 2015-12-11 | Disposition: A | Discharge status: Home / Self Care | Payer: Medicare Other | Source: Ambulatory Visit | Attending: Family Medicine | Admitting: Family Medicine

## 2015-12-11 ENCOUNTER — Ambulatory Visit: Payer: Medicare Other | Admitting: Family Medicine

## 2015-12-11 ENCOUNTER — Other Ambulatory Visit: Payer: Self-pay | Admitting: Family Medicine

## 2015-12-11 ENCOUNTER — Ambulatory Visit: Payer: Medicare Other | Admitting: Internal Medicine

## 2015-12-11 DIAGNOSIS — N6310 Unspecified lump in the right breast, unspecified quadrant: Secondary | ICD-10-CM

## 2015-12-13 ENCOUNTER — Ambulatory Visit (INDEPENDENT_AMBULATORY_CARE_PROVIDER_SITE_OTHER): Payer: Medicare Other | Admitting: Family Medicine

## 2015-12-13 ENCOUNTER — Encounter: Payer: Self-pay | Admitting: Family Medicine

## 2015-12-13 DIAGNOSIS — N63 Unspecified lump in unspecified breast: Secondary | ICD-10-CM

## 2015-12-13 DIAGNOSIS — N6310 Unspecified lump in the right breast, unspecified quadrant: Secondary | ICD-10-CM | POA: Insufficient documentation

## 2015-12-13 NOTE — Patient Instructions (Signed)
Their office (or ours) will call on Monday.  Take care  Dr. Lacinda Axon

## 2015-12-13 NOTE — Progress Notes (Signed)
Subjective:  [name]: Mia Perez, female    DOB: 02/23/1933  Age: 82 y.o. MRN: [id number]  CC: Discuss mammogram results  HPI:  82 year old female with a history of breast cancer presents to discuss her recent mammogram results.  Patient had a annual mammogram on 10/19. At that time, her mammogram was abnormal and it was recommended that she have a diagnostic. Patient underwent a diagnostic mammogram and ultrasound which revealed a persistent mass in the 10:00 region of the right breast 4 cm from the nipple. Suspicious for malignancy. Ultrasound-guided biopsy was recommended.  Patient presents today to discuss this. She is aware of her abnormal ultrasound and mammogram. She is anxious and is concerned as she has not been contacted about scheduling of the biopsy. Patient has seen Dr. Bary Castilla in the past. She would like to discuss the best course of action regarding obtaining a biopsy. Patient is asymptomatic at this time. No palpable mass. No other associated symptoms. No other complaints at this time.  Social Hx   Social History   Social History  . Marital status: Widowed    Spouse name: N/A  . Number of children: 1  . Years of education: N/A   Occupational History  . Retired     Social History Main Topics  . Smoking status: Never Smoker  . Smokeless tobacco: Never Used  . Alcohol use No  . Drug use: No  . Sexual activity: No   Other Topics Concern  . None   Social History Narrative   Lives in [place]. Husband passed away 65. [name], daughter.      Work - [employer] 13 years, then [place] - regular    Review of Systems  Constitutional: Negative.   Breast - No mass appreciated.  Objective:  BP (!) 148/70 (BP Location: Right Arm, Patient Position: Sitting, Cuff Size: Normal)   Pulse 82   Temp 99.1 F (37.3 C) (Oral)   Resp 14   Wt 153 lb 6 oz (69.6 kg)   SpO2 96%   BMI 24.02 kg/m   BP/Weight  12/13/2015 06/10/2015 [id number]  Systolic BP [id number]  Diastolic BP 70 74 67  Wt. (Lbs) 153.38 154 154  BMI 24.02 24.11 24.11   Physical Exam  Constitutional: She is oriented to person, place, and time. She appears well-developed. No distress.  HENT:  Head: Normocephalic and atraumatic.  Pulmonary/Chest: Effort normal.  Neurological: She is alert and oriented to person, place, and time.  Psychiatric: She has a normal mood and affect.  Vitals reviewed.  Lab Results  Component Value Date   WBC 8.8 09/14/2014   HGB 12.8 09/14/2014   HCT 38.2 09/14/2014   PLT 453.0 (H) 09/14/2014   GLUCOSE 93 06/19/2015   CHOL 203 (H) 09/19/2013   TRIG 146.0 09/19/2013   HDL 43.40 09/19/2013   LDLDIRECT 152.8 08/31/2012   LDLCALC 130 (H) 09/19/2013   ALT 13 06/10/2015   AST 15 06/10/2015   NA 140 06/19/2015   K 3.8 06/19/2015   CL 104 06/19/2015   CREATININE 1.29 (H) 06/19/2015   BUN 29 (H) 06/19/2015   CO2 28 06/19/2015   TSH 2.31 09/14/2014   MICROALBUR 0.8 09/19/2013   Assessment & Plan:   Problem List Items Addressed This Visit    Breast mass    New problem. At this point time her diagnosis and prognosis is unclear. She is  in need of biopsy. We had a lengthy discussion today about radiology doing the ultrasound guided biopsy versus going through general surgery. Patient elected to have general surgery performed the biopsy. I discussed this with the on call Surgeon Dr. Jamal Collin. Patient will be scheduled as soon as possible. Placing referral to ensure this.       Other Visit Diagnoses   None.    Meds ordered this encounter  Medications  . LUMIGAN 0.01 % SOLN  . aspirin 325 MG tablet    Sig: Take 325 mg by mouth daily.   Follow-up: Pending biopsy results  30 minutes were spent face-to-face with the patient during this encounter and All of that time was spent on counseling regarding mammogram results, and options regarding biopsy/best course of action in addition to  discussing this with surgery/coordinating referral.   [place]

## 2015-12-13 NOTE — Progress Notes (Signed)
Pre visit review using our clinic review tool, if applicable. No additional management support is needed unless otherwise documented below in the visit note. 

## 2015-12-13 NOTE — Assessment & Plan Note (Signed)
New problem. At this point time her diagnosis and prognosis is unclear. She is in need of biopsy. We had a lengthy discussion today about radiology doing the ultrasound guided biopsy versus going through general surgery. Patient elected to have general surgery performed the biopsy. I discussed this with the on call Surgeon Dr. Jamal Collin. Patient will be scheduled as soon as possible. Placing referral to ensure this.

## 2015-12-17 ENCOUNTER — Encounter: Payer: Self-pay | Admitting: *Deleted

## 2015-12-19 ENCOUNTER — Encounter: Payer: Self-pay | Admitting: General Surgery

## 2015-12-19 ENCOUNTER — Ambulatory Visit (INDEPENDENT_AMBULATORY_CARE_PROVIDER_SITE_OTHER): Payer: Medicare Other | Admitting: General Surgery

## 2015-12-19 ENCOUNTER — Inpatient Hospital Stay: Payer: Self-pay

## 2015-12-19 VITALS — BP 138/70 | HR 68 | Resp 12 | Ht 67.5 in | Wt 152.0 lb

## 2015-12-19 DIAGNOSIS — C50411 Malignant neoplasm of upper-outer quadrant of right female breast: Secondary | ICD-10-CM | POA: Diagnosis not present

## 2015-12-19 DIAGNOSIS — N6310 Unspecified lump in the right breast, unspecified quadrant: Secondary | ICD-10-CM

## 2015-12-19 DIAGNOSIS — Z853 Personal history of malignant neoplasm of breast: Secondary | ICD-10-CM | POA: Insufficient documentation

## 2015-12-19 NOTE — Progress Notes (Signed)
[name]: Mia Perez, female   DOB: Jun 15, 1933, 82 y.o.   MRN: [id number]  Chief Complaint  Patient presents with  . Breast Problem    abnormal mammogram    HPI Mia Perez is a 82 y.o. female.  who presents for a breast evaluation. The most recent mammogram was done on 11-28-15. Right breast added views and ultrasound was 12-11-15. She has a known history of left breast cancer in 1998. She states she could not feel anything different in the breast. Patient does not perform regular self breast checks and gets regular mammograms done.     HPI  Past Medical History:  Diagnosis Date  . Breast cancer ([facility]) 02/10/1996   left, lumpectomy and radiation  . Cancer ([facility]) 16 yrs ago   breast, left  . DVT (deep venous thrombosis) ([facility]) 11/2014  . Hypertension   . Lymphocytic colitis   . Personal history of radiation therapy 1998   BREAST CA  . Ulcerative colitis ([facility]) 2014  . Varicose veins     Past Surgical History:  Procedure Laterality Date  . BREAST BIOPSY Left 1998   POS  . BREAST EXCISIONAL BIOPSY Right 2014   NEG  . BREAST LUMPECTOMY    . BREAST SURGERY Left 1998   lumpectomy, with radiation/ Dr Sharlet Salina  . BREAST SURGERY Right 2014   excision with wire localization/ Dr Bary Castilla  . COLONOSCOPY  2012   Dr Earlean Shawl  . DILATION AND CURETTAGE OF UTERUS    . HERNIA REPAIR    . NECK SURGERY    . PERIPHERAL VASCULAR CATHETERIZATION N/A 12/13/2014   Procedure: IVC Filter Insertion;  Surgeon: Algernon Huxley, MD;  Location: [facility];  Service: Cardiovascular;  Laterality: N/A;  . PERIPHERAL VASCULAR CATHETERIZATION  12/13/2014   Procedure: Lower Extremity Intervention;  Surgeon: Algernon Huxley, MD;  Location: [facility];  Service: Cardiovascular;;  . PERIPHERAL VASCULAR CATHETERIZATION N/A 02/20/2015   Procedure: IVC Filter Removal;  Surgeon: Algernon Huxley, MD;  Location: [facility];  Service: Cardiovascular;  Laterality: N/A;    Family History   Problem Relation Age of Onset  . Heart disease Mother   . Hypertension Mother   . Ovarian cancer Mother 81  . Hypertension Sister   . Hypertension Brother   . Ovarian cancer Paternal Aunt 85  . Breast cancer Neg Hx     Social History Social History  Substance Use Topics  . Smoking status: Never Smoker  . Smokeless tobacco: Never Used  . Alcohol use No    Allergies  Allergen Reactions  . Penicillins Rash    Current Outpatient Prescriptions  Medication Sig Dispense Refill  . amLODipine (NORVASC) 5 MG tablet Take 1 tablet (5 mg total) by mouth daily. 90 tablet 3  . aspirin 325 MG tablet Take 325 mg by mouth daily.    . budesonide (ENTOCORT EC) 3 MG 24 hr capsule Take 1 capsule (3 mg total) by mouth daily. 90 capsule 3  . cholecalciferol (VITAMIN D) 1000 UNITS tablet Take 1,000 Units by mouth 2 (two) times daily.    . clidinium-chlordiazePOXIDE (LIBRAX) 5-2.5 MG capsule Take 1 capsule by mouth daily. 90 capsule 3  . Cyanocobalamin (VITAMIN B-12) 5000 MCG TBDP Take by mouth daily.    . diphenoxylate-atropine (LOMOTIL) 2.5-0.025 MG tablet TAKE 1 TABLET BY MOUTH 4 TIMES A DAY AS NEEDED FOR LOOSE STOOLS 360 tablet 0  . ESTRACE VAGINAL 0.1 MG/GM vaginal cream INSERT 1  APPLICATORFUL VAGINALLY AS DIRECTED 42.5 g 1  . LUMIGAN 0.01 % SOLN Place 1 drop into both eyes at bedtime.     . metoprolol succinate (TOPROL-XL) 100 MG 24 hr tablet TAKE 1 TABLET (100 MG TOTAL) BY MOUTH DAILY. TAKE WITH OR IMMEDIATELY FOLLOWING A MEAL. 90 tablet 2  . Multiple Vitamins-Minerals (PRESERVISION AREDS 2 PO) Take by mouth daily.    . simethicone (MYLICON) 0000000 MG chewable tablet Chew 125 mg by mouth every 6 (six) hours as needed for flatulence.    . valsartan (DIOVAN) 320 MG tablet Take 1 tablet (320 mg total) by mouth daily. 90 tablet 3   No current facility-administered medications for this visit.     Review of Systems Review of Systems  Constitutional: Negative.   Respiratory: Negative.    Cardiovascular: Negative.     Blood pressure 138/70, pulse 68, resp. rate 12, height 5' 7.5" (1.715 m), weight 152 lb (68.9 kg).  Physical Exam Physical Exam  Constitutional: She is oriented to person, place, and time. She appears well-developed and well-nourished.  HENT:  Mouth/Throat: Oropharynx is clear and moist.  Eyes: Conjunctivae are normal. No scleral icterus.  Neck: Neck supple.  Cardiovascular: Normal rate, regular rhythm and normal heart sounds.   Pulmonary/Chest: Effort normal and breath sounds normal. Right breast exhibits no inverted nipple, no mass, no nipple discharge, no skin change and no tenderness. Left breast exhibits no inverted nipple, no mass, no nipple discharge, no skin change and no tenderness.    Lymphadenopathy:    She has no cervical adenopathy.    She has no axillary adenopathy.  Neurological: She is alert and oriented to person, place, and time.  Skin: Skin is warm and dry.  Psychiatric: Her behavior is normal.    Data Reviewed 11/28/2015 screening mammograms as well as 12/11/2015 right breast additional views and ultrasound were reviewed. New focal density in the upper-outer quadrant of the right breast, BI-RADS-4.  Ultrasound examination of the right breast in the 10:00 position 4 cm from the nipple showed an irregular hypoechoic mass measuring 0.6 x 0.7 x 0.97 cm. Focal posterior acoustic shadowing is identified. This corresponded with the images from [facility].  The patient was amenable to core biopsy. 10 mL of 0.5% Xylocaine with 0.25% Marcaine with 1-200,000 of epinephrine was utilized well tolerated. A 10-gauge Encor device was placed within the center of the lesion in 6 core samples essentially remove this area. An 11 mm biopsy depth was chosen. Scant bleeding was noted. A postbiopsy clip was placed. The skin defect was closed with benzoin and Steri-Strip.  Postbiopsy instructions were provided to the patient.  Assessment     Breast mass,  suspicious for malignancy.    Plan    The patient will be contacted when pathology is available.        This information has been scribed by Karie Fetch RN, BSN,BC.   Robert Bellow 12/19/2015, 9:02 PM

## 2015-12-19 NOTE — Patient Instructions (Addendum)

## 2015-12-23 ENCOUNTER — Ambulatory Visit (INDEPENDENT_AMBULATORY_CARE_PROVIDER_SITE_OTHER): Payer: Medicare Other | Admitting: General Surgery

## 2015-12-23 ENCOUNTER — Encounter: Payer: Self-pay | Admitting: General Surgery

## 2015-12-23 VITALS — BP 130/74 | HR 70 | Resp 12 | Ht 68.0 in | Wt 154.0 lb

## 2015-12-23 DIAGNOSIS — C50411 Malignant neoplasm of upper-outer quadrant of right female breast: Secondary | ICD-10-CM | POA: Diagnosis not present

## 2015-12-23 NOTE — Progress Notes (Signed)
[name]: Mia Perez, female   DOB: 03-Dec-1933, 82 y.o.   MRN: [id number]  Chief Complaint  Patient presents with  . Other    biopsy discussion    HPI Mia Perez is a 82 y.o. female here today for a biopsy breast discussion. Patient here today for follow up post breast biopsy.  Dressing removed, steristrip in place and aware it may come off in one week.  Minimal bruising noted.  The patient is aware that a heating pad may be used for comfort as needed.  Aware of pathology. HPI  Past Medical History:  Diagnosis Date  . Breast cancer ([place]) 02/10/1996   left, lumpectomy and radiation  . Cancer ([place]) 16 yrs ago   breast, left  . DVT (deep venous thrombosis) ([place]) 11/2014  . Hypertension   . Lymphocytic colitis   . Personal history of radiation therapy 1998   BREAST CA  . Ulcerative colitis ([place]) 2014  . Varicose veins     Past Surgical History:  Procedure Laterality Date  . BREAST BIOPSY Left 1998   POS  . BREAST EXCISIONAL BIOPSY Right 2014   NEG  . BREAST LUMPECTOMY    . BREAST SURGERY Left 1998   lumpectomy, with radiation/ Dr Sharlet Salina  . BREAST SURGERY Right 2014   excision with wire localization/ Dr Bary Castilla  . COLONOSCOPY  2012   Dr Earlean Shawl  . DILATION AND CURETTAGE OF UTERUS    . HERNIA REPAIR    . NECK SURGERY    . PERIPHERAL VASCULAR CATHETERIZATION N/A 12/13/2014   Procedure: IVC Filter Insertion;  Surgeon: Algernon Huxley, MD;  Location: [facility];  Service: Cardiovascular;  Laterality: N/A;  . PERIPHERAL VASCULAR CATHETERIZATION  12/13/2014   Procedure: Lower Extremity Intervention;  Surgeon: Algernon Huxley, MD;  Location: [facility];  Service: Cardiovascular;;  . PERIPHERAL VASCULAR CATHETERIZATION N/A 02/20/2015   Procedure: IVC Filter Removal;  Surgeon: Algernon Huxley, MD;  Location: [facility];  Service: Cardiovascular;  Laterality: N/A;    Family History  Problem Relation Age of Onset  . Heart disease Mother   .  Hypertension Mother   . Ovarian cancer Mother 51  . Hypertension Sister   . Hypertension Brother   . Ovarian cancer Paternal Aunt 85  . Breast cancer Neg Hx     Social History Social History  Substance Use Topics  . Smoking status: Never Smoker  . Smokeless tobacco: Never Used  . Alcohol use No    Allergies  Allergen Reactions  . Penicillins Rash    Current Outpatient Prescriptions  Medication Sig Dispense Refill  . amLODipine (NORVASC) 5 MG tablet Take 1 tablet (5 mg total) by mouth daily. 90 tablet 3  . aspirin 325 MG tablet Take 325 mg by mouth daily.    . budesonide (ENTOCORT EC) 3 MG 24 hr capsule Take 1 capsule (3 mg total) by mouth daily. 90 capsule 3  . cholecalciferol (VITAMIN D) 1000 UNITS tablet Take 1,000 Units by mouth 2 (two) times daily.    . clidinium-chlordiazePOXIDE (LIBRAX) 5-2.5 MG capsule Take 1 capsule by mouth daily. 90 capsule 3  . Cyanocobalamin (VITAMIN B-12) 5000 MCG TBDP Take by mouth daily.    . diphenoxylate-atropine (LOMOTIL) 2.5-0.025 MG tablet TAKE 1 TABLET BY MOUTH 4 TIMES A DAY AS NEEDED FOR LOOSE STOOLS 360 tablet 0  . ESTRACE VAGINAL 0.1 MG/GM vaginal cream INSERT 1 APPLICATORFUL VAGINALLY AS DIRECTED 42.5 g 1  .  LUMIGAN 0.01 % SOLN Place 1 drop into both eyes at bedtime.     . metoprolol succinate (TOPROL-XL) 100 MG 24 hr tablet TAKE 1 TABLET (100 MG TOTAL) BY MOUTH DAILY. TAKE WITH OR IMMEDIATELY FOLLOWING A MEAL. 90 tablet 2  . Multiple Vitamins-Minerals (PRESERVISION AREDS 2 PO) Take by mouth daily.    . simethicone (MYLICON) 0000000 MG chewable tablet Chew 125 mg by mouth every 6 (six) hours as needed for flatulence.    . valsartan (DIOVAN) 320 MG tablet Take 1 tablet (320 mg total) by mouth daily. 90 tablet 3   No current facility-administered medications for this visit.     Review of Systems Review of Systems  Constitutional: Negative.   Respiratory: Negative.   Cardiovascular: Negative.     Blood pressure 130/74, pulse 70, resp.  rate 12, height 5\' 8"  (1.727 m), weight 154 lb (69.9 kg).  Physical Exam Physical Exam  Constitutional: She is oriented to person, place, and time. She appears well-developed and well-nourished.  Pulmonary/Chest:  Minimal bruising at the biopsy site reported by the CMA.  Neurological: She is alert and oriented to person, place, and time.  Skin: Skin is warm and dry.    Data Reviewed Invasive mammary carcinoma right breast, upper-outer quadrant, T1b based on imaging  Assessment    Stage I carcinoma the right breast.    Plan     The majority of the visit was spent reviewing the options for breast cancer treatment. Breast conservation with lumpectomy and radiation therapy  was presented as equivalent to mastectomy for long-term control. The pros and cons of each treatment regimen were reviewed. The indications for additional therapy such as chemotherapy were touched on briefly, realizing that the majority of information required to determine if chemotherapy would be of benefit is not available at this time. The availability of consultation services for medical oncology and radiation oncology prior to surgery were reviewed.   The patient had previously undergone breast conservation with axillary dissection in the left breast in the 1990s. She was advised that axillary dissection is no longer routinely undertaken, replaced by sentinel node biopsy.  We discussed that radiation therapy isn't flux for women above 80, which she will be next month. She initially had the sense that she might be declined needed treatment. I emphasized to her that this time is gone on there still been some questions about who benefits most, in the same way that mastectomy was replaced by partial mastectomy without diminution of care, and axillary dissection was replaced by sentinel node biopsy, some patients may or may not benefit from the addition of radiation therapy. This be discussed at a later date after her formal  excision is completed.  Patient's surgery has been scheduled for 12-30-15 at [facility]. This patient will remain on 325 mg aspirin once daily pre-op.      This information has been scribed by Gaspar Cola CMA.   Robert Bellow 12/23/2015, 8:11 PM

## 2015-12-24 ENCOUNTER — Telehealth: Payer: Self-pay | Admitting: *Deleted

## 2015-12-24 NOTE — Telephone Encounter (Signed)
-----  Message from Robert Bellow, MD sent at 12/23/2015  9:36 PM EST ----- Please notify the patient did clarify that she would likely be at candidate for genetic testing. I would encourage her to have BRCA testing done at her convenience. This does not need to be before surgery.

## 2015-12-24 NOTE — Telephone Encounter (Signed)
Notified patient as instructed, patient will consider testing after surgery. Per Almyra Free at [facility] prior authorization not required. Testing process reviewed.

## 2015-12-25 NOTE — Patient Instructions (Signed)
  Your procedure is scheduled on: 12-30-15 (MONDAY) Report to [facility] [place] (2ND DESK ON RIGHT) @ 8:15 AM   Remember: Instructions that are not followed completely may result in serious medical risk, up to and including death, or upon the discretion of your surgeon and anesthesiologist your surgery may need to be rescheduled.    _x___ 1. Do not eat food or drink liquids after midnight. No gum chewing or hard candies.     __x__ 2. No Alcohol for 24 hours before or after surgery.   __x__3. No Smoking for 24 prior to surgery.   ____  4. Bring all medications with you on the day of surgery if instructed.    __x__ 5. Notify your doctor if there is any change in your medical condition     (cold, fever, infections).     Do not wear jewelry, make-up, hairpins, clips or nail polish.  Do not wear lotions, powders, or perfumes. You may wear deodorant.  Do not shave 48 hours prior to surgery. Men may shave face and neck.  Do not bring valuables to the hospital.    [facility] is not responsible for any belongings or valuables.               Contacts, dentures or bridgework may not be worn into surgery.  Leave your suitcase in the car. After surgery it may be brought to your room.  For patients admitted to the hospital, discharge time is determined by your treatment team.   Patients discharged the day of surgery will not be allowed to drive home.    Please read over the following fact sheets that you were given:   [facility] Preparing for Surgery and or MRSA Information   _x___ Take these medicines the morning of surgery with A SIP OF WATER:    1. AMLODIPINE (NORVASC)  2. VALSARTAN (DIOVAN)  3. METOPROLOL  4.  5.  6.  ____Fleets enema or Magnesium Citrate as directed.   _x___ Use CHG Soap or sage wipes as directed on instruction sheet   ____ Use inhalers on the day of surgery and bring to hospital day of surgery  ____ Stop metformin 2 days prior to surgery    ____  Take 1/2 of usual insulin dose the night before surgery and none on the morning of surgery.   ____ Stop aspirin or coumadin, or plavix-OK TO CONTINUE 325 MG ASPIRIN-DO NOT TAKE AM OF SURGERY  __ Stop Anti-inflammatories such as Advil, Aleve, Ibuprofen, Motrin, Naproxen,          Naprosyn, Goodies powders or aspirin products. Ok to take Tylenol.   _X___ Stop supplements until after surgery-STOP PRESERVISION NOW  ____ Bring C-Pap to the hospital.

## 2015-12-26 ENCOUNTER — Encounter
Admission: RE | Admit: 2015-12-26 | Discharge: 2015-12-26 | Disposition: A | Discharge status: Home / Self Care | Payer: Medicare Other | Source: Ambulatory Visit | Attending: General Surgery | Admitting: General Surgery

## 2015-12-26 ENCOUNTER — Other Ambulatory Visit: Payer: Self-pay

## 2015-12-26 DIAGNOSIS — Z01818 Encounter for other preprocedural examination: Secondary | ICD-10-CM | POA: Insufficient documentation

## 2015-12-26 DIAGNOSIS — C50411 Malignant neoplasm of upper-outer quadrant of right female breast: Secondary | ICD-10-CM | POA: Insufficient documentation

## 2015-12-26 DIAGNOSIS — Z01812 Encounter for preprocedural laboratory examination: Secondary | ICD-10-CM | POA: Insufficient documentation

## 2015-12-26 HISTORY — DX: Gastro-esophageal reflux disease without esophagitis: K21.9

## 2015-12-26 HISTORY — DX: Unspecified osteoarthritis, unspecified site: M19.90

## 2015-12-26 HISTORY — DX: Anemia, unspecified: D64.9

## 2015-12-26 LAB — BASIC METABOLIC PANEL
ANION GAP: 8 (ref 5–15)
BUN: 28 mg/dL — AB (ref 6–20)
CALCIUM: 9.4 mg/dL (ref 8.9–10.3)
CO2: 24 mmol/L (ref 22–32)
Chloride: 106 mmol/L (ref 101–111)
Creatinine, Ser: 1.11 mg/dL — ABNORMAL HIGH (ref 0.44–1.00)
GFR calc Af Amer: 53 mL/min — ABNORMAL LOW (ref >60)
GFR, EST NON AFRICAN AMERICAN: 46 mL/min — AB (ref >60)
GLUCOSE: 92 mg/dL (ref 65–99)
Potassium: 3.8 mmol/L (ref 3.5–5.1)
SODIUM: 138 mmol/L (ref 135–145)

## 2015-12-26 LAB — CBC WITH DIFFERENTIAL/PLATELET
BASOS ABS: 0 10*3/uL (ref 0–0.1)
Basophils Relative: 1 %
EOS ABS: 0.3 10*3/uL (ref 0–0.7)
EOS PCT: 3 %
HCT: 39.4 % (ref 35.0–47.0)
Hemoglobin: 13.4 g/dL (ref 12.0–16.0)
LYMPHS PCT: 16 %
Lymphs Abs: 1.5 10*3/uL (ref 1.0–3.6)
MCH: 29.9 pg (ref 26.0–34.0)
MCHC: 34.1 g/dL (ref 32.0–36.0)
MCV: 87.7 fL (ref 80.0–100.0)
MONO ABS: 0.6 10*3/uL (ref 0.2–0.9)
Monocytes Relative: 7 %
Neutro Abs: 6.6 10*3/uL — ABNORMAL HIGH (ref 1.4–6.5)
Neutrophils Relative %: 73 %
PLATELETS: 292 10*3/uL (ref 150–440)
RBC: 4.5 MIL/uL (ref 3.80–5.20)
RDW: 14.5 % (ref 11.5–14.5)
WBC: 8.9 10*3/uL (ref 3.6–11.0)

## 2015-12-27 LAB — CEA: CEA: 2.8 ng/mL (ref 0.0–4.7)

## 2015-12-27 LAB — CANCER ANTIGEN 27.29: CA 27.29: 33.6 U/mL (ref 0.0–38.6)

## 2015-12-30 ENCOUNTER — Ambulatory Visit: Payer: Medicare Other | Admitting: Anesthesiology

## 2015-12-30 ENCOUNTER — Ambulatory Visit
Admission: RE | Admit: 2015-12-30 | Discharge: 2015-12-30 | Disposition: A | Discharge status: Home / Self Care | Payer: Medicare Other | Source: Ambulatory Visit | Attending: General Surgery | Admitting: General Surgery

## 2015-12-30 ENCOUNTER — Encounter
Admission: RE | Admit: 2015-12-30 | Discharge: 2015-12-30 | Disposition: A | Discharge status: Home / Self Care | Payer: Medicare Other | Source: Ambulatory Visit | Attending: General Surgery | Admitting: General Surgery

## 2015-12-30 ENCOUNTER — Encounter
Admission: RE | Disposition: A | Discharge status: Home / Self Care | Payer: Self-pay | Source: Ambulatory Visit | Attending: General Surgery

## 2015-12-30 ENCOUNTER — Encounter: Payer: Self-pay | Admitting: *Deleted

## 2015-12-30 DIAGNOSIS — Z7952 Long term (current) use of systemic steroids: Secondary | ICD-10-CM | POA: Insufficient documentation

## 2015-12-30 DIAGNOSIS — Z79890 Hormone replacement therapy (postmenopausal): Secondary | ICD-10-CM | POA: Insufficient documentation

## 2015-12-30 DIAGNOSIS — Z923 Personal history of irradiation: Secondary | ICD-10-CM | POA: Insufficient documentation

## 2015-12-30 DIAGNOSIS — Z7982 Long term (current) use of aspirin: Secondary | ICD-10-CM | POA: Insufficient documentation

## 2015-12-30 DIAGNOSIS — Z853 Personal history of malignant neoplasm of breast: Secondary | ICD-10-CM

## 2015-12-30 DIAGNOSIS — N6310 Unspecified lump in the right breast, unspecified quadrant: Secondary | ICD-10-CM

## 2015-12-30 DIAGNOSIS — Z79899 Other long term (current) drug therapy: Secondary | ICD-10-CM | POA: Insufficient documentation

## 2015-12-30 DIAGNOSIS — Z86718 Personal history of other venous thrombosis and embolism: Secondary | ICD-10-CM

## 2015-12-30 DIAGNOSIS — I10 Essential (primary) hypertension: Secondary | ICD-10-CM | POA: Insufficient documentation

## 2015-12-30 DIAGNOSIS — K52832 Lymphocytic colitis: Secondary | ICD-10-CM | POA: Insufficient documentation

## 2015-12-30 DIAGNOSIS — K5190 Ulcerative colitis, unspecified, without complications: Secondary | ICD-10-CM | POA: Insufficient documentation

## 2015-12-30 DIAGNOSIS — C50411 Malignant neoplasm of upper-outer quadrant of right female breast: Secondary | ICD-10-CM | POA: Diagnosis not present

## 2015-12-30 HISTORY — PX: BREAST LUMPECTOMY WITH SENTINEL LYMPH NODE BIOPSY: SHX5597

## 2015-12-30 SURGERY — BREAST LUMPECTOMY WITH SENTINEL LYMPH NODE BX
Anesthesia: General | Laterality: Right | Wound class: Clean

## 2015-12-30 MED ORDER — PROPOFOL 10 MG/ML IV BOLUS
INTRAVENOUS | Status: DC | PRN
  Administered 2015-12-30: 100 mg via INTRAVENOUS

## 2015-12-30 MED ORDER — ONDANSETRON HCL 4 MG/2ML IJ SOLN
4.0000 mg | Freq: Once | INTRAMUSCULAR | Status: AC | PRN
  Administered 2015-12-30: 4 mg via INTRAVENOUS

## 2015-12-30 MED ORDER — BUPIVACAINE-EPINEPHRINE (PF) 0.5% -1:200000 IJ SOLN
INTRAMUSCULAR | Status: AC
  Filled 2015-12-30: qty 30

## 2015-12-30 MED ORDER — TECHNETIUM TC 99M SULFUR COLLOID
1.0480 | Freq: Once | INTRAVENOUS | Status: AC | PRN
  Administered 2015-12-30: 1.048 via INTRAVENOUS

## 2015-12-30 MED ORDER — EPHEDRINE SULFATE 50 MG/ML IJ SOLN
INTRAMUSCULAR | Status: DC | PRN
  Administered 2015-12-30: 10 mg via INTRAVENOUS
  Administered 2015-12-30 (×2): 5 mg via INTRAVENOUS

## 2015-12-30 MED ORDER — ACETAMINOPHEN 10 MG/ML IV SOLN
INTRAVENOUS | Status: AC
  Filled 2015-12-30: qty 100

## 2015-12-30 MED ORDER — FENTANYL CITRATE (PF) 100 MCG/2ML IJ SOLN: 25.0000 ug | INTRAMUSCULAR | Status: DC | PRN

## 2015-12-30 MED ORDER — FENTANYL CITRATE (PF) 100 MCG/2ML IJ SOLN
INTRAMUSCULAR | Status: DC | PRN
  Administered 2015-12-30 (×2): 50 ug via INTRAVENOUS

## 2015-12-30 MED ORDER — LIDOCAINE HCL (CARDIAC) 20 MG/ML IV SOLN
INTRAVENOUS | Status: DC | PRN
  Administered 2015-12-30: 100 mg via INTRAVENOUS

## 2015-12-30 MED ORDER — METHYLENE BLUE 0.5 % INJ SOLN
INTRAVENOUS | Status: AC
  Filled 2015-12-30: qty 10

## 2015-12-30 MED ORDER — MIDAZOLAM HCL 2 MG/2ML IJ SOLN
INTRAMUSCULAR | Status: DC | PRN
  Administered 2015-12-30: 2 mg via INTRAVENOUS

## 2015-12-30 MED ORDER — FAMOTIDINE 20 MG PO TABS
ORAL_TABLET | ORAL | Status: AC
  Administered 2015-12-30: 20 mg via ORAL
  Filled 2015-12-30: qty 1

## 2015-12-30 MED ORDER — GLYCOPYRROLATE 0.2 MG/ML IJ SOLN
INTRAMUSCULAR | Status: DC | PRN
  Administered 2015-12-30: 0.2 mg via INTRAVENOUS

## 2015-12-30 MED ORDER — LACTATED RINGERS IV SOLN
INTRAVENOUS | Status: DC
  Administered 2015-12-30: 09:00:00 via INTRAVENOUS

## 2015-12-30 MED ORDER — HYDROCODONE-ACETAMINOPHEN 5-325 MG PO TABS: 1.0000 | ORAL_TABLET | ORAL | 0 refills | Status: DC | PRN

## 2015-12-30 MED ORDER — BUPIVACAINE-EPINEPHRINE (PF) 0.5% -1:200000 IJ SOLN
INTRAMUSCULAR | Status: DC | PRN
  Administered 2015-12-30: 20 mL via PERINEURAL

## 2015-12-30 MED ORDER — FAMOTIDINE 20 MG PO TABS
20.0000 mg | ORAL_TABLET | Freq: Once | ORAL | Status: AC
  Administered 2015-12-30: 20 mg via ORAL

## 2015-12-30 MED ORDER — METHYLENE BLUE 0.5 % INJ SOLN
INTRAVENOUS | Status: DC | PRN
  Administered 2015-12-30: 4 mL via SUBMUCOSAL

## 2015-12-30 SURGICAL SUPPLY — 50 items
BANDAGE ELASTIC 6 LF NS (GAUZE/BANDAGES/DRESSINGS) ×2 IMPLANT
BLADE SURG 15 STRL SS SAFETY (BLADE) ×4 IMPLANT
BNDG GAUZE 4.5X4.1 6PLY STRL (MISCELLANEOUS) ×2 IMPLANT
BULB RESERV EVAC DRAIN JP 100C (MISCELLANEOUS) IMPLANT
CANISTER SUCT 1200ML W/VALVE (MISCELLANEOUS) ×2 IMPLANT
CHLORAPREP W/TINT 26ML (MISCELLANEOUS) ×2 IMPLANT
CNTNR SPEC 2.5X3XGRAD LEK (MISCELLANEOUS) ×1
CONT SPEC 4OZ STER OR WHT (MISCELLANEOUS) ×1
CONTAINER SPEC 2.5X3XGRAD LEK (MISCELLANEOUS) ×1 IMPLANT
COVER PROBE FLX POLY STRL (MISCELLANEOUS) ×2 IMPLANT
DEVICE DUBIN SPECIMEN MAMMOGRA (MISCELLANEOUS) ×2 IMPLANT
DRAIN CHANNEL JP 15F RND 16 (MISCELLANEOUS) IMPLANT
DRAPE LAPAROTOMY TRNSV 106X77 (MISCELLANEOUS) ×2 IMPLANT
DRSG TELFA 3X8 NADH (GAUZE/BANDAGES/DRESSINGS) ×2 IMPLANT
ELECT CAUTERY BLADE TIP 2.5 (TIP)
ELECT REM PT RETURN 9FT ADLT (ELECTROSURGICAL) ×2
ELECTRODE CAUTERY BLDE TIP 2.5 (TIP) IMPLANT
ELECTRODE REM PT RTRN 9FT ADLT (ELECTROSURGICAL) ×1 IMPLANT
GAUZE FLUFF 18X24 1PLY STRL (GAUZE/BANDAGES/DRESSINGS) ×2 IMPLANT
GAUZE SPONGE 4X4 12PLY STRL (GAUZE/BANDAGES/DRESSINGS) ×2 IMPLANT
GLOVE BIO SURGEON STRL SZ7.5 (GLOVE) ×6 IMPLANT
GLOVE INDICATOR 8.0 STRL GRN (GLOVE) ×2 IMPLANT
GOWN STRL REUS W/ TWL LRG LVL3 (GOWN DISPOSABLE) ×2 IMPLANT
GOWN STRL REUS W/TWL LRG LVL3 (GOWN DISPOSABLE) ×2
HARMONIC SCALPEL FOCUS (MISCELLANEOUS) IMPLANT
KIT RM TURNOVER STRD PROC AR (KITS) ×2 IMPLANT
LABEL OR SOLS (LABEL) ×2 IMPLANT
MARGIN MAP 10MM (MISCELLANEOUS) ×2 IMPLANT
NDL SAFETY 22GX1.5 (NEEDLE) ×2 IMPLANT
NEEDLE HYPO 25X1 1.5 SAFETY (NEEDLE) ×4 IMPLANT
PACK BASIN MINOR ARMC (MISCELLANEOUS) ×2 IMPLANT
SHEARS FOC LG CVD HARMONIC 17C (MISCELLANEOUS) IMPLANT
SLEVE PROBE SENORX GAMMA FIND (MISCELLANEOUS) IMPLANT
STRIP CLOSURE SKIN 1/2X4 (GAUZE/BANDAGES/DRESSINGS) ×2 IMPLANT
SUT ETHILON 3-0 FS-10 30 BLK (SUTURE) ×2
SUT SILK 2 0 (SUTURE) ×1
SUT SILK 2-0 18XBRD TIE 12 (SUTURE) ×1 IMPLANT
SUT VIC AB 2-0 CT1 27 (SUTURE) ×2
SUT VIC AB 2-0 CT1 TAPERPNT 27 (SUTURE) ×2 IMPLANT
SUT VIC AB 3-0 SH 27 (SUTURE) ×2
SUT VIC AB 3-0 SH 27X BRD (SUTURE) ×2 IMPLANT
SUT VIC AB 4-0 FS2 27 (SUTURE) ×4 IMPLANT
SUT VICRYL+ 3-0 144IN (SUTURE) ×2 IMPLANT
SUTURE EHLN 3-0 FS-10 30 BLK (SUTURE) ×1 IMPLANT
SWABSTK COMLB BENZOIN TINCTURE (MISCELLANEOUS) ×2 IMPLANT
SYR BULB IRRIG 60ML STRL (SYRINGE) ×2 IMPLANT
SYR CONTROL 10ML (SYRINGE) ×2 IMPLANT
SYRINGE 10CC LL (SYRINGE) ×2 IMPLANT
TAPE TRANSPORE STRL 2 31045 (GAUZE/BANDAGES/DRESSINGS) ×2 IMPLANT
WATER STERILE IRR 1000ML POUR (IV SOLUTION) ×2 IMPLANT

## 2015-12-30 NOTE — Op Note (Signed)
Preoperative diagnosis: Right breast cancer.  Postoperative diagnosis: Same.  Operative procedure: Right breast wide excision with sentinel node biopsy.  Operating surgeon: Ollen Bowl, M.D.  Anesthesia: Gen. by LMA, Marcaine 0.5% with 1-200,000 of epinephrine, 30 mL.  Clinical note: This 82 year old woman recently was no an abnormal mammogram and core biopsy showed evidence of an invasive mammary carcinoma. She desired breast conservation. She was injected with technetium sulfur colloid prior to the procedure.  Operative note: The patient underwent general anesthesia without difficulty. 4 mL of 0.5% methylene blue was instilled in the subareolar plexus. The breast just next to was then prepped with ChloraPrep and draped. The  breast was scanned with ultrasound of the previously biopsied nodule of the 10:00 position, 4 cm from the nipple was clearly identified. A curvilinear incision was made over this area after instillation of local anesthetic. The skin was incise sharply the remaining dissection completed with electrocautery. The specimen was orientated and specimen radiograph showed the clip in the center of the specimen. Gross examination report from pathology showed the margins clear. Attention was turned to the axilla. The Node Seeker was used and the axillary and below and. Multiple blue lymphatics were noted throughout the area but only a single hot, blue lymph node was identified. An additional 15 minutes was spent examining the various lymphatic channels but no additional nodal material was identified.  The wound was closed in layers with interrupted 2-0 Vicryl figure-of-eight sutures. The skin was elevated medially with cautery to provide for smoother closure. This was extended to the base the nipple. The deep tissue was approximated with interrupted 2-0 Vicryl sutures and the skin closed with a running 4-0 Vicryl subcuticular suture. Benzoin and Steri-Strips followed by Telfa, fluffed  gauze and a surgical bra were applied.  The patient tolerated the procedure well and was taken to recovery in stable condition.

## 2015-12-30 NOTE — H&P (Signed)
No change in clinical history or plans for breast conservation.

## 2015-12-30 NOTE — Discharge Instructions (Signed)
AMBULATORY SURGERY  °DISCHARGE INSTRUCTIONS ° ° °1) The drugs that you were given will stay in your system until tomorrow so for the next 24 hours you should not: ° °A) Drive an automobile °B) Make any legal decisions °C) Drink any alcoholic beverage ° ° °2) You may resume regular meals tomorrow.  Today it is better to start with liquids and gradually work up to solid foods. ° °You may eat anything you prefer, but it is better to start with liquids, then soup and crackers, and gradually work up to solid foods. ° ° °3) Please notify your doctor immediately if you have any unusual bleeding, trouble breathing, redness and pain at the surgery site, drainage, fever, or pain not relieved by medication. ° ° ° °4) Additional Instructions: ° ° ° ° ° ° ° °Please contact your physician with any problems or Same Day Surgery at [phone], Monday through Friday 6 am to 4 pm, or [place] at [facility] number at [phone]. °

## 2015-12-30 NOTE — OR Nursing (Signed)
Dr. Bary Castilla into see pt and family.

## 2015-12-30 NOTE — Transfer of Care (Signed)
Immediate Anesthesia Transfer of Care Note  Patient: Mia Perez  Procedure(s) Performed: Procedure(s): BREAST LUMPECTOMY WITH SENTINEL LYMPH NODE BX (Right) BREAST RECONSTRUCTION WITH MASTOPLASTY (Right)  Patient Location: PACU  Anesthesia Type:General  Level of Consciousness: awake, alert  and oriented  Airway & Oxygen Therapy: Patient Spontanous Breathing and Patient connected to face mask oxygen  Post-op Assessment: Report given to RN and Post -op Vital signs reviewed and stable  Post vital signs: Reviewed and stable  Last Vitals:  Vitals:   12/30/15 0903 12/30/15 1215  BP: (!) 148/69   Pulse: 66   Resp: 14   Temp: 36.5 C (!) (P) 36.1 C    Last Pain:  Vitals:   12/30/15 1215  TempSrc:   PainSc: (P) Asleep         Complications: No apparent anesthesia complications

## 2015-12-30 NOTE — Anesthesia Procedure Notes (Signed)
Procedure Name: LMA Insertion Date/Time: 12/30/2015 10:57 AM Performed by: Jennette Bill Pre-anesthesia Checklist: Patient identified, Patient being monitored, Timeout performed, Emergency Drugs available and Suction available Patient Re-evaluated:Patient Re-evaluated prior to inductionOxygen Delivery Method: Circle system utilized Preoxygenation: Pre-oxygenation with 100% oxygen Intubation Type: IV induction Ventilation: Mask ventilation without difficulty LMA: LMA inserted LMA Size: 3.5 Tube type: Oral Number of attempts: 1 Placement Confirmation: positive ETCO2 and breath sounds checked- equal and bilateral Tube secured with: Tape Dental Injury: Teeth and Oropharynx as per pre-operative assessment

## 2015-12-30 NOTE — Anesthesia Postprocedure Evaluation (Signed)
Anesthesia Post Note  Patient: Mia Perez  Procedure(s) Performed: Procedure(s) (LRB): BREAST LUMPECTOMY WITH SENTINEL LYMPH NODE BX (Right) BREAST RECONSTRUCTION WITH MASTOPLASTY (Right)  Patient location during evaluation: PACU Anesthesia Type: General Level of consciousness: awake and alert Pain management: pain level controlled Vital Signs Assessment: post-procedure vital signs reviewed and stable Respiratory status: spontaneous breathing, nonlabored ventilation, respiratory function stable and patient connected to nasal cannula oxygen Cardiovascular status: blood pressure returned to baseline and stable Postop Assessment: no signs of nausea or vomiting Anesthetic complications: no    Last Vitals:  Vitals:   12/30/15 1319 12/30/15 1401  BP: (!) 128/56 (!) 127/50  Pulse: 62 83  Resp: 16 18  Temp: (!) 36.1 C     Last Pain:  Vitals:   12/30/15 1401  TempSrc:   PainSc: 0-No pain                 Bobbi Kozakiewicz S

## 2015-12-30 NOTE — Anesthesia Preprocedure Evaluation (Addendum)
Anesthesia Evaluation  Patient identified by MRN, date of birth, ID band Patient awake    Reviewed: Allergy & Precautions, NPO status , Patient's Chart, lab work & pertinent test results, reviewed documented beta blocker date and time   Airway Mallampati: II  TM Distance: >3 FB     Dental  (+) Chipped   Pulmonary           Cardiovascular hypertension, Pt. on medications and Pt. on home beta blockers      Neuro/Psych    GI/Hepatic PUD, GERD  Controlled,  Endo/Other    Renal/GU      Musculoskeletal  (+) Arthritis ,   Abdominal   Peds  Hematology  (+) anemia ,   Anesthesia Other Findings EKG ok.  Reproductive/Obstetrics                            Anesthesia Physical Anesthesia Plan  ASA: III  Anesthesia Plan: General   Post-op Pain Management:    Induction: Intravenous  Airway Management Planned: LMA  Additional Equipment:   Intra-op Plan:   Post-operative Plan:   Informed Consent: I have reviewed the patients History and Physical, chart, labs and discussed the procedure including the risks, benefits and alternatives for the proposed anesthesia with the patient or authorized representative who has indicated his/her understanding and acceptance.     Plan Discussed with: CRNA  Anesthesia Plan Comments:         Anesthesia Quick Evaluation

## 2016-01-01 ENCOUNTER — Encounter: Payer: Self-pay | Admitting: General Surgery

## 2016-01-01 LAB — SURGICAL PATHOLOGY

## 2016-01-02 IMAGING — US US EXTREM LOW VENOUS*R*
1 series · 13 of 24 positions shown · non-contrast
Comparison: None.

CLINICAL DATA: Right lower extremity edema for 2-1/2 weeks.



[Series 1: us extrem low venous*right* · 0.07mm/px · 13 of 33 slices shown]
[im 1/33]
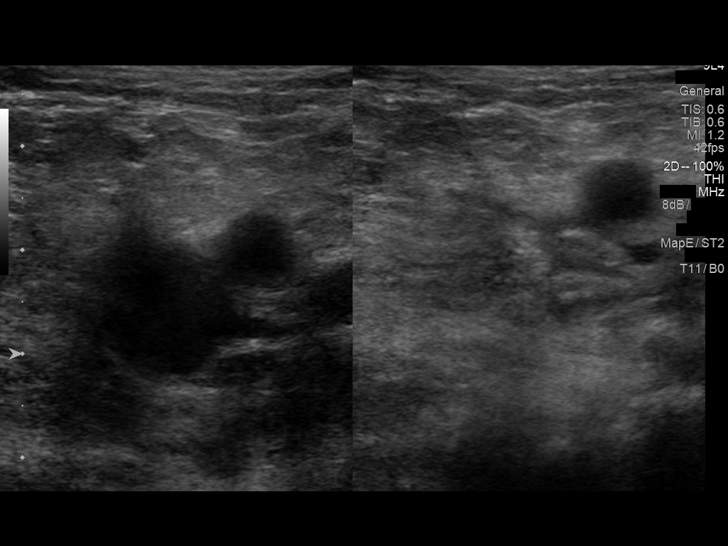
[im 3/33]
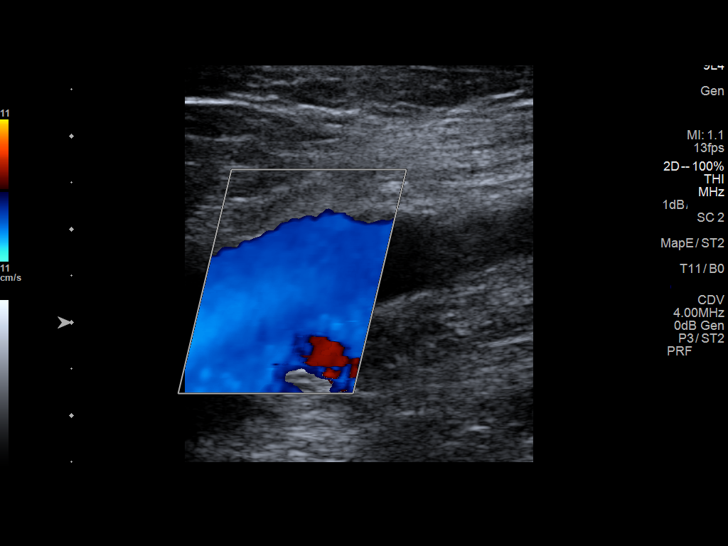
[im 6/33]
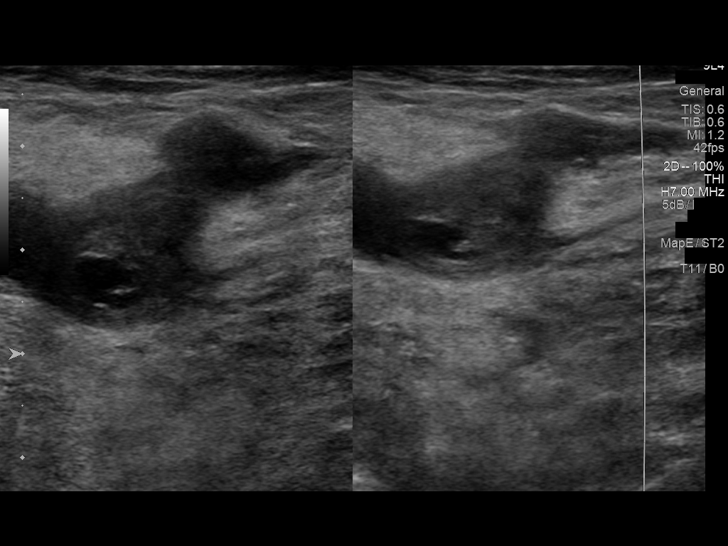
[im 9/33]
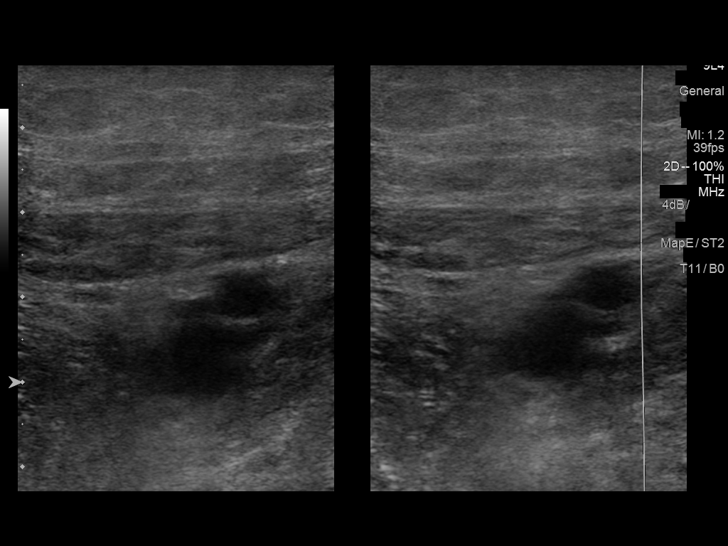
[im 12/33]
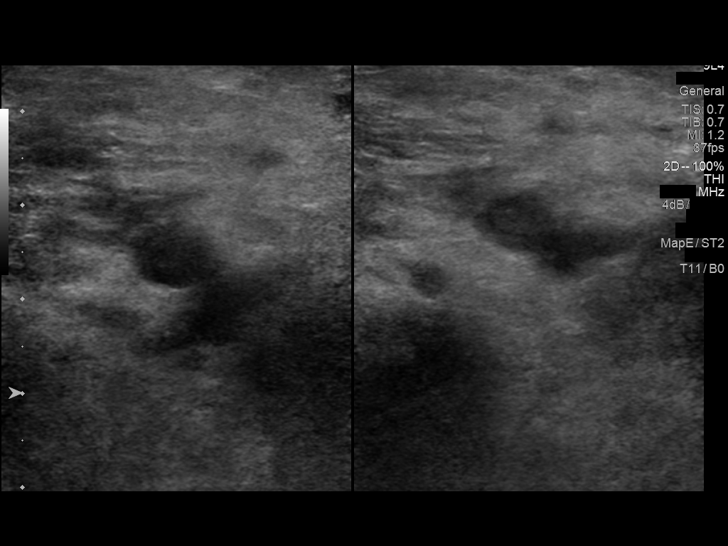
[im 14/33]
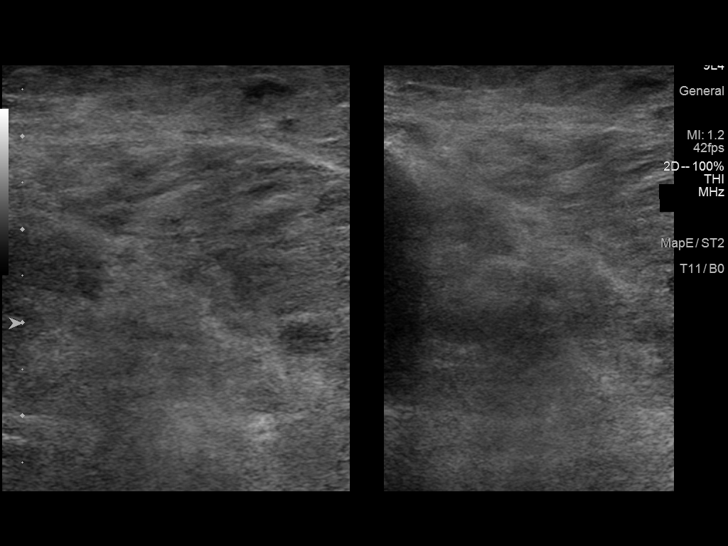
[im 17/33]
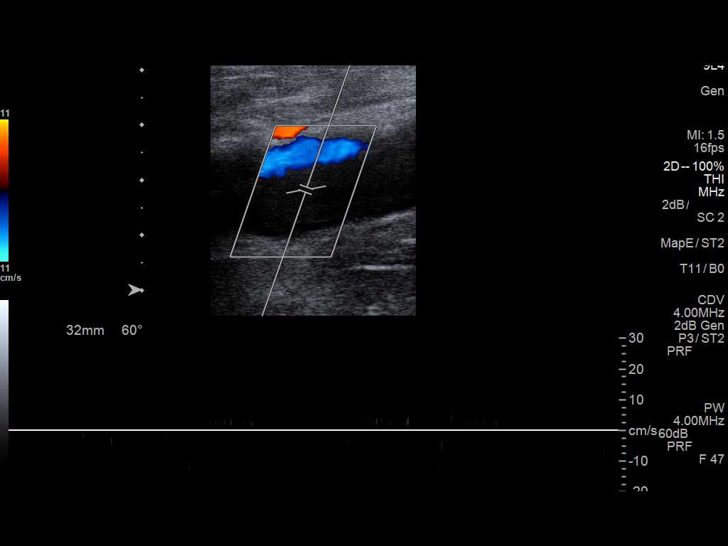
[im 19/33]
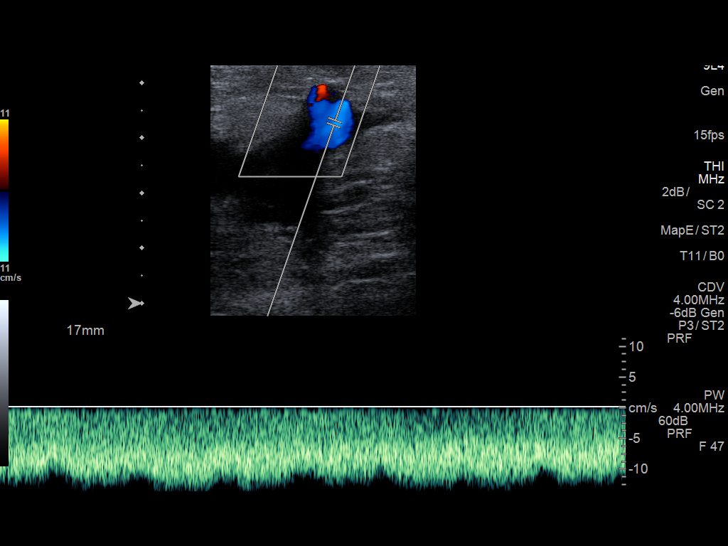
[im 21/33]
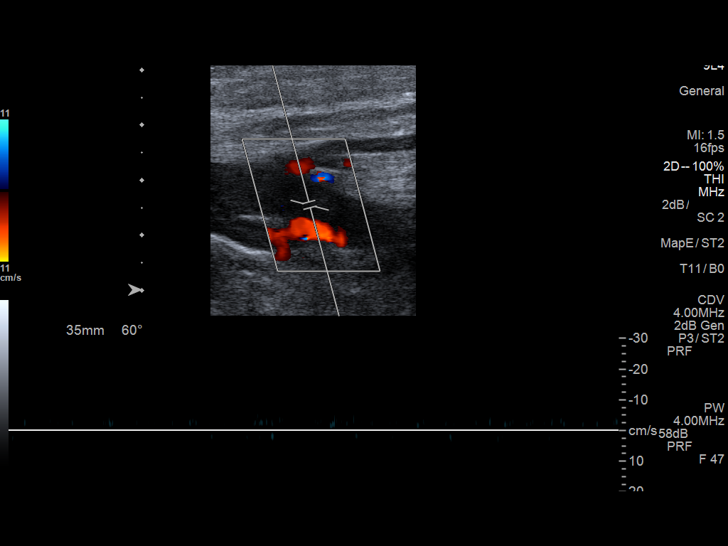
[im 24/33]
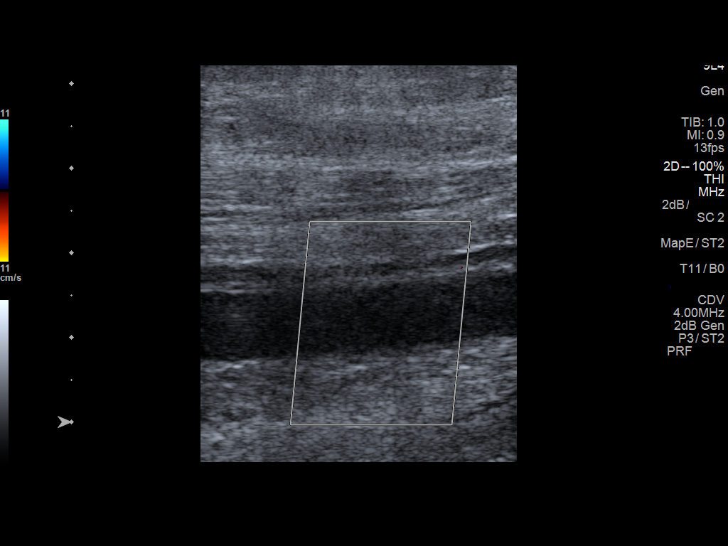
[im 27/33]
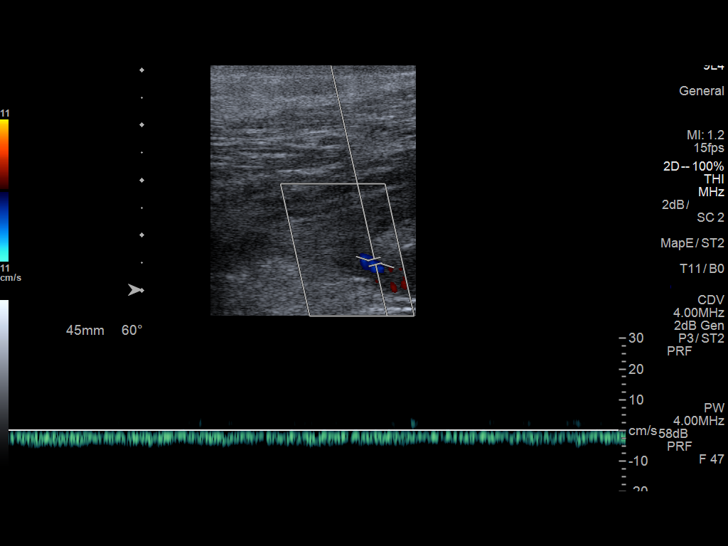
[im 30/33]
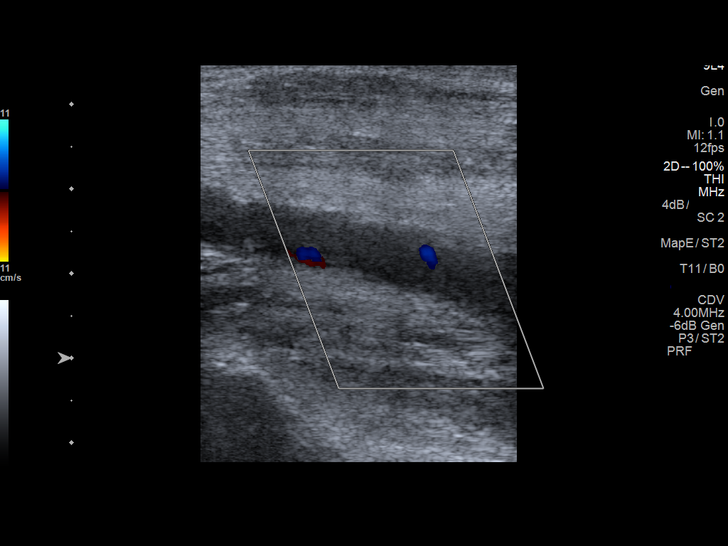
[im 33/33]
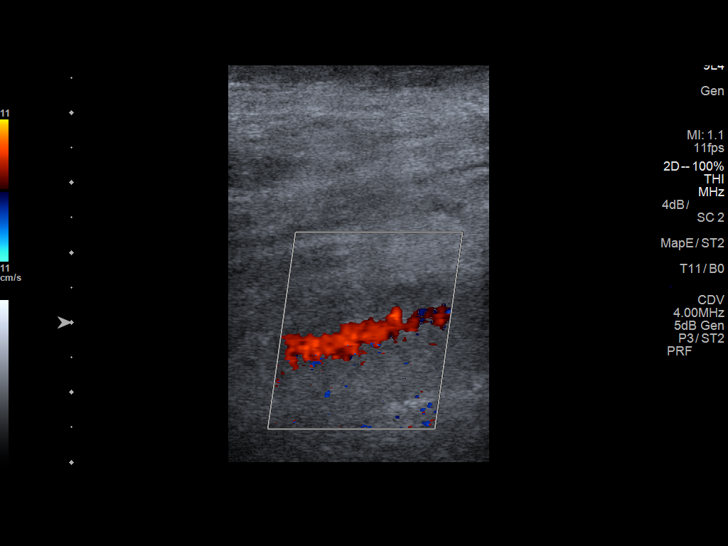

[13 of 24 positions shown; findings below may reference images not displayed]

FINDINGS: Contralateral Common Femoral Vein: Respiratory phasicity is normal
and symmetric with the symptomatic side. No evidence of thrombus.
Normal compressibility.

Common Femoral Vein: Occlusive thrombus is identified in the common
femoral vein.

Saphenofemoral Junction: Nonocclusive thrombus extends into the
saphenofemoral junction and great saphenous vein.

Profunda Femoral Vein: Occlusive thrombus identified.

Femoral Vein: Upper segment of the femoral vein demonstrates
nonocclusive thrombus. Thrombus becomes more occlusive in the mid to
distal thigh.

Popliteal Vein: Nearly occlusive thrombus is identified in the
popliteal vein with some flow present.

Calf Veins: Occlusive thrombus is identified in posterior tibial and
peroneal veins of the upper calf.

Superficial Great Saphenous Vein: Nonocclusive thrombus identified
in the great saphenous vein.

Venous Reflux:  None.

Other Findings:  None.
IMPRESSION: Extensive DVT of the right lower extremity with extensive thrombus
seen throughout the common femoral, femoral, popliteal and proximal
calf veins. Most of this thrombus is occlusive/nearly occlusive.
Thrombus also extends through the saphenofemoral junction into the
great saphenous vein.

## 2016-01-06 ENCOUNTER — Encounter: Payer: Self-pay | Admitting: Radiation Oncology

## 2016-01-06 ENCOUNTER — Inpatient Hospital Stay: Payer: Self-pay

## 2016-01-06 ENCOUNTER — Encounter: Payer: Self-pay | Admitting: General Surgery

## 2016-01-06 ENCOUNTER — Ambulatory Visit: Payer: Medicare Other | Admitting: General Surgery

## 2016-01-06 ENCOUNTER — Inpatient Hospital Stay (INDEPENDENT_AMBULATORY_CARE_PROVIDER_SITE_OTHER): Payer: Medicare Other

## 2016-01-06 ENCOUNTER — Ambulatory Visit
Admission: RE | Admit: 2016-01-06 | Discharge: 2016-01-06 | Disposition: A | Discharge status: Home / Self Care | Payer: Medicare Other | Source: Ambulatory Visit | Attending: Radiation Oncology | Admitting: Radiation Oncology

## 2016-01-06 VITALS — BP 132/70 | HR 80 | Resp 14 | Ht 68.0 in | Wt 152.0 lb

## 2016-01-06 VITALS — BP 129/72 | HR 60 | Temp 97.4°F | Resp 20 | Wt 151.9 lb

## 2016-01-06 DIAGNOSIS — Z853 Personal history of malignant neoplasm of breast: Secondary | ICD-10-CM | POA: Insufficient documentation

## 2016-01-06 DIAGNOSIS — Z7982 Long term (current) use of aspirin: Secondary | ICD-10-CM | POA: Diagnosis not present

## 2016-01-06 DIAGNOSIS — Z510 Encounter for antineoplastic radiation therapy: Secondary | ICD-10-CM | POA: Diagnosis not present

## 2016-01-06 DIAGNOSIS — K219 Gastro-esophageal reflux disease without esophagitis: Secondary | ICD-10-CM | POA: Diagnosis not present

## 2016-01-06 DIAGNOSIS — Z170 Estrogen receptor positive status [ER+]: Secondary | ICD-10-CM | POA: Insufficient documentation

## 2016-01-06 DIAGNOSIS — Z8719 Personal history of other diseases of the digestive system: Secondary | ICD-10-CM | POA: Diagnosis not present

## 2016-01-06 DIAGNOSIS — M129 Arthropathy, unspecified: Secondary | ICD-10-CM | POA: Diagnosis not present

## 2016-01-06 DIAGNOSIS — Z923 Personal history of irradiation: Secondary | ICD-10-CM | POA: Insufficient documentation

## 2016-01-06 DIAGNOSIS — Z8041 Family history of malignant neoplasm of ovary: Secondary | ICD-10-CM | POA: Insufficient documentation

## 2016-01-06 DIAGNOSIS — I8390 Asymptomatic varicose veins of unspecified lower extremity: Secondary | ICD-10-CM | POA: Diagnosis not present

## 2016-01-06 DIAGNOSIS — D649 Anemia, unspecified: Secondary | ICD-10-CM | POA: Diagnosis not present

## 2016-01-06 DIAGNOSIS — C50411 Malignant neoplasm of upper-outer quadrant of right female breast: Secondary | ICD-10-CM | POA: Diagnosis not present

## 2016-01-06 DIAGNOSIS — Z86718 Personal history of other venous thrombosis and embolism: Secondary | ICD-10-CM | POA: Diagnosis not present

## 2016-01-06 DIAGNOSIS — Z79899 Other long term (current) drug therapy: Secondary | ICD-10-CM | POA: Diagnosis not present

## 2016-01-06 DIAGNOSIS — I10 Essential (primary) hypertension: Secondary | ICD-10-CM | POA: Diagnosis not present

## 2016-01-06 MED ORDER — CEFADROXIL 500 MG PO CAPS: ORAL_CAPSULE | ORAL | 0 refills | Status: DC

## 2016-01-06 NOTE — Consult Note (Signed)
NEW PATIENT EVALUATION  Name: Mia Perez  MRN: [id number]  Date:   01/06/2016     DOB: [birth year]   This 82 y.o. female patient presents to the clinic for initial evaluation of evaluation of stage I invasive mammary carcinoma of the right breast status post wide local excision and sentinel node biopsy. Tumor is ER/PR positive HER-2/neu not overexpressed  REFERRING PHYSICIAN: Coral Spikes, DO  CHIEF COMPLAINT:  Chief Complaint  Patient presents with  . Breast Cancer    Pt is here for initial consultation of breast consult.      DIAGNOSIS: The encounter diagnosis was Malignant neoplasm of upper-outer quadrant of right female breast, unspecified estrogen receptor status ([place]).   PREVIOUS INVESTIGATIONS:  Pathology reports reviewed Mammograms and ultrasound reviewed Clinical notes reviewed  HPI: Patient is a 82 year old female out 19 years completing radiation therapy to her left breast for invasive mammary carcinoma in our department. She recently presented with an abnormal mammogram of her right breast showing a lesion in the 10:00 position 4 cm from the nipple which was confirmed on ultrasound to be a hypoechoic mass measuring 0.6 x 0.7 x 0.9 cm. This was a BI-RADS 4 and she underwent ultrasound-guided biopsy positive for invasive mammary carcinoma ER/PR positive HER-2/neu not overexpressed. She went on to have a wide local excision and sentinel node biopsy for a 47m area of grade 1 invasive mammary carcinoma margins clear at 9 mm. One lymph node was negative for metastatic disease. She has done well postoperatively. She seen today for recommendations on adjuvant radiation therapy. She specifically denies breast tenderness cough or bone pain.  PLANNED TREATMENT REGIMEN: Possible accelerated partial breast radiation  PAST MEDICAL HISTORY:  has a past medical history of Anemia; Arthritis; [place] DVT (deep [place]) ([place]  (11[place]); GERD (gastroesophageal reflux disease); Hypertension; Lymphocytic colitis; Personal history of radiation therapy (1998); [place]cerative colitis [place] ([place]); and Varicose veins.    PAST SURGICAL HISTORY:  Past Surgical History:  Procedure Laterality Date  . BREAST BIOPSY Left 1998   POS  . BREAST EXCISIONAL BIOPSY Right 2014   NEG  . BREAST LUMPECTOMY    . BREAST LUMPECTOMY WITH SENTINEL LYMPH NODE BIOPSY Right 12/30/2015   Procedure: BREAST LUMPECTOMY WITH SENTINEL LYMPH NODE BX;  Surgeon: JRobert Bellow MD;  Location: [facility];  Service: General;  Laterality: Right;  . BREAST SURGERY Left 1998   lumpectomy, with radiation/ Dr CSharlet Salina . BREAST SURGERY Right 2014   excision with wire localization/ Dr BBary Castilla . COLONOSCOPY  2012   Dr MEarlean Shawl . DILATION AND CURETTAGE OF UTERUS    . HERNIA REPAIR    . HYSTEROSCOPY    . NECK SURGERY    . PERIPHERAL VASCULAR CATHETERIZATION N/A 12/13/2014   Procedure: IVC Filter Insertion;  Surgeon: JAlgernon Huxley MD;  [facility];  Service: Cardiovascular;  Laterality: N/A;  . PERIPHERAL VASCULAR CATHETERIZATION  12/13/2014   Procedure: Lower Extremity Intervention;  Surgeon: JAlgernon Huxley MD;  [facility];  Service: Cardiovascular;;  . PERIPHERAL VASCULAR CATHETERIZATION N/A 02/20/2015   Procedure: IVC Filter Removal;  Surgeon: JAlgernon Huxley MD;  [facility];  Service: Cardiovascular;  Laterality: N/A;    FAMILY HISTORY: family history includes Heart disease in her mother; Hypertension in her brother, mother, and sister; Ovarian cancer (age of onset: 728 in her mother; Ovarian cancer (age of onset: 889  in her paternal aunt.  SOCIAL HISTORY:  reports that she has never smoked. She has never used smokeless tobacco. She reports that she does not drink alcohol or use drugs.  ALLERGIES: Penicillins  MEDICATIONS:  Current Outpatient Prescriptions  Medication Sig Dispense Refill  .  amLODipine (NORVASC) 5 MG tablet Take 1 tablet (5 mg total) by mouth daily. (Patient taking differently: Take 5 mg by mouth every morning. ) 90 tablet 3  . budesonide (ENTOCORT EC) 3 MG 24 hr capsule Take 1 capsule (3 mg total) by mouth daily. (Patient taking differently: Take 3 mg by mouth every other day. ) 90 capsule 3  . Ca Carbonate-Mag Hydroxide (ROLAIDS PO) Take 1 tablet by mouth as needed.    . Cholecalciferol (VITAMIN D) 2000 units CAPS Take 2,000 Units by mouth daily.    . clidinium-chlordiazePOXIDE (LIBRAX) 5-2.5 MG capsule Take 1 capsule by mouth daily. 90 capsule 3  . Cyanocobalamin (VITAMIN B-12) 5000 MCG TBDP Take 5,000 mcg by mouth every other day.     . diphenoxylate-atropine (LOMOTIL) 2.5-0.025 MG tablet TAKE 1 TABLET BY MOUTH 4 TIMES A DAY AS NEEDED FOR LOOSE STOOLS (Patient taking differently: Take 2-3 tablets daily as needed for loose stools) 360 tablet 0  . estradiol (ESTRACE VAGINAL) 0.1 MG/GM vaginal cream at bedtime. Apply pea sized amount to urethra nightly    . LUMIGAN 0.01 % SOLN Place 1 drop into both eyes at bedtime.     . metoprolol succinate (TOPROL-XL) 100 MG 24 hr tablet TAKE 1 TABLET (100 MG TOTAL) BY MOUTH DAILY. TAKE WITH OR IMMEDIATELY FOLLOWING A MEAL. (Patient taking differently: TAKE 1 TABLET (100 MG TOTAL) BY MOUTH DAILY. TAKE WITH OR IMMEDIATELY FOLLOWING A MEAL.-AM) 90 tablet 2  . Multiple Vitamins-Minerals (PRESERVISION AREDS 2 PO) Take 1 tablet by mouth 2 (two) times daily.     . Polyvinyl Alcohol (LUBRICANT DROPS OP) Apply 1 drop to eye daily as needed (dry eyes).    . valsartan (DIOVAN) 320 MG tablet Take 1 tablet (320 mg total) by mouth daily. (Patient taking differently: Take 320 mg by mouth every morning. ) 90 tablet 3  . aspirin 325 MG tablet Take 325 mg by mouth daily.    Derrill Memo ON 01/07/2016] cefadroxil (DURICEF) 500 MG capsule Take one pill by mouth twice a day starting one hour prior to procedure. 20 capsule 0  . simethicone (MYLICON) 937 MG  chewable tablet Chew 125 mg by mouth every morning.      No current facility-administered medications for this encounter.     ECOG PERFORMANCE STATUS:  0 - Asymptomatic  REVIEW OF SYSTEMS:  Patient denies any weight loss, fatigue, weakness, fever, chills or night sweats. Patient denies any loss of vision, blurred vision. Patient denies any ringing  of the ears or hearing loss. No irregular heartbeat. Patient denies heart murmur or history of fainting. Patient denies any chest pain or pain radiating to her upper extremities. Patient denies any shortness of breath, difficulty breathing at night, cough or hemoptysis. Patient denies any swelling in the lower legs. Patient denies any nausea vomiting, vomiting of blood, or coffee ground material in the vomitus. Patient denies any stomach pain. Patient states has had normal bowel movements no significant constipation or diarrhea. Patient denies any dysuria, hematuria or significant nocturia. Patient denies any problems walking, swelling in the joints or loss of balance. Patient denies any skin changes, loss of hair or loss of weight. Patient denies any excessive worrying or anxiety or  significant depression. Patient denies any problems with insomnia. Patient denies excessive thirst, polyuria, polydipsia. Patient denies any swollen glands, patient denies easy bruising or easy bleeding. Patient denies any recent infections, allergies or URI. Patient "s visual fields have not changed significantly in recent time.    PHYSICAL EXAM: BP 129/72   Pulse 60   Temp 97.4 F (36.3 C)   Resp 20   Wt 151 lb 14.4 oz (68.9 kg)   BMI 23.10 kg/m  Right breast has a recent wide local excision site which is healing well. No dominant mass or nodularity is noted in either breast in 2 positions examined. No axillary or supraclavicular adenopathy is identified. Well-developed well-nourished patient in NAD. HEENT reveals PERLA, EOMI, discs not visualized.  Oral cavity is  clear. No oral mucosal lesions are identified. Neck is clear without evidence of cervical or supraclavicular adenopathy. Lungs are clear to A&P. Cardiac examination is essentially unremarkable with regular rate and rhythm without murmur rub or thrill. Abdomen is benign with no organomegaly or masses noted. Motor sensory and DTR levels are equal and symmetric in the upper and lower extremities. Cranial nerves II through XII are grossly intact. Proprioception is intact. No peripheral adenopathy or edema is identified. No motor or sensory levels are noted. Crude visual fields are within normal range.  LABORATORY DATA: Pathology reports reviewed    RADIOLOGY RESULTS: Mammogram and ultrasound reviewed   IMPRESSION: Stage I invasive mammary carcinoma of the right breast status post wide local excision and sentinel node biopsy in 82 year old female  PLAN: Patient is an excellent general condition and I would recommend adjuvant radiation therapy. Patient may be a good candidate for accelerated partial breast irradiation to her right breast. I'm recommending surgeon place MammoSite catheter and we will deliver 3400 cGy in 10 fractions at 340 cGy twice a day using high dose rate remote afterloading. Risks and benefits of treatment including possible small area of erythema of the skin permanent thickening of the lumpectomy site fatigue all were discussed in detail with the patient. She seems to comprehend my treatment plan well. Patient also may be candidate for antiestrogen therapy after completion of radiation. We will coordinate with surgeon's office placement of the bladder MammoSite balloon and perform BrachyVision treatment planning at that time. All appointment will be made.  I would like to take this opportunity to thank you for allowing me to participate in the care of your patient.Armstead Peaks., MD

## 2016-01-06 NOTE — Progress Notes (Signed)
[name]: Mia Perez, female   DOB: Jun 17, 1933, 82 y.o.   MRN: [id number]  Chief Complaint  Patient presents with  . Routine Post Op    HPI Mia Perez is a 82 y.o. female here for a post op visit from a right breast lumpectomy done on 12/30/15. She reports that she is doing well. She is not using any pain medication.   HPI  Past Medical History:  Diagnosis Date  . Anemia    H/O  . Arthritis   . Breast cancer ([place]) 02/10/1996   left, lumpectomy and radiation  . Cancer ([place]) 16 yrs ago   breast, left  . DVT (deep venous thrombosis) ([place]) 11/2014  . GERD (gastroesophageal reflux disease)   . Hypertension   . Lymphocytic colitis   . Personal history of radiation therapy 1998   BREAST CA  . Ulcerative colitis ([place]) 2014  . Varicose veins     Past Surgical History:  Procedure Laterality Date  . BREAST BIOPSY Left 1998   POS  . BREAST EXCISIONAL BIOPSY Right 2014   NEG  . BREAST LUMPECTOMY    . BREAST LUMPECTOMY WITH SENTINEL LYMPH NODE BIOPSY Right 12/30/2015   Procedure: BREAST LUMPECTOMY WITH SENTINEL LYMPH NODE BX;  Surgeon: Robert Bellow, MD;  Location: [facility];  Service: General;  Laterality: Right;  . BREAST SURGERY Left 1998   lumpectomy, with radiation/ Dr Sharlet Salina  . BREAST SURGERY Right 2014   excision with wire localization/ Dr Bary Castilla  . COLONOSCOPY  2012   Dr Earlean Shawl  . DILATION AND CURETTAGE OF UTERUS    . HERNIA REPAIR    . HYSTEROSCOPY    . NECK SURGERY    . PERIPHERAL VASCULAR CATHETERIZATION N/A 12/13/2014   Procedure: IVC Filter Insertion;  Surgeon: Algernon Huxley, MD;  Location: [place] [facility];  Service: Cardiovascular;  Laterality: N/A;  . PERIPHERAL VASCULAR CATHETERIZATION  12/13/2014   Procedure: Lower Extremity Intervention;  Surgeon: Algernon Huxley, MD;  Location: [place] [facility];  Service: Cardiovascular;;  . PERIPHERAL VASCULAR CATHETERIZATION N/A 02/20/2015   Procedure: IVC Filter Removal;  Surgeon: Algernon Huxley, MD;   Location: [place] [facility];  Service: Cardiovascular;  Laterality: N/A;    Family History  Problem Relation Age of Onset  . Heart disease Mother   . Hypertension Mother   . Ovarian cancer Mother 44  . Hypertension Sister   . Hypertension Brother   . Ovarian cancer Paternal Aunt 85  . Breast cancer Neg Hx     Social History Social History  Substance Use Topics  . Smoking status: Never Smoker  . Smokeless tobacco: Never Used  . Alcohol use No    Allergies  Allergen Reactions  . Penicillins Rash    Has patient had a PCN reaction causing immediate rash, facial/tongue/throat swelling, SOB or lightheadedness with hypotension: Yes Has patient had a PCN reaction causing severe rash involving mucus membranes or skin necrosis: Unknown Has patient had a PCN reaction that required hospitalization No Has patient had a PCN reaction occurring within the last 10 years: No If all of the above answers are "NO", then may proceed with Cephalosporin use.     Current Outpatient Prescriptions  Medication Sig Dispense Refill  . amLODipine (NORVASC) 5 MG tablet Take 1 tablet (5 mg total) by mouth daily. (Patient taking differently: Take 5 mg by mouth every morning. ) 90 tablet 3  . aspirin 325 MG tablet Take 325 mg by  mouth daily.    . budesonide (ENTOCORT EC) 3 MG 24 hr capsule Take 1 capsule (3 mg total) by mouth daily. (Patient taking differently: Take 3 mg by mouth every other day. ) 90 capsule 3  . Ca Carbonate-Mag Hydroxide (ROLAIDS PO) Take 1 tablet by mouth as needed.    . Cholecalciferol (VITAMIN D) 2000 units CAPS Take 2,000 Units by mouth daily.    . clidinium-chlordiazePOXIDE (LIBRAX) 5-2.5 MG capsule Take 1 capsule by mouth daily. 90 capsule 3  . Cyanocobalamin (VITAMIN B-12) 5000 MCG TBDP Take 5,000 mcg by mouth every other day.     . diphenoxylate-atropine (LOMOTIL) 2.5-0.025 MG tablet TAKE 1 TABLET BY MOUTH 4 TIMES A DAY AS NEEDED FOR LOOSE STOOLS (Patient taking differently:  Take 2-3 tablets daily as needed for loose stools) 360 tablet 0  . LUMIGAN 0.01 % SOLN Place 1 drop into both eyes at bedtime.     . metoprolol succinate (TOPROL-XL) 100 MG 24 hr tablet TAKE 1 TABLET (100 MG TOTAL) BY MOUTH DAILY. TAKE WITH OR IMMEDIATELY FOLLOWING A MEAL. (Patient taking differently: TAKE 1 TABLET (100 MG TOTAL) BY MOUTH DAILY. TAKE WITH OR IMMEDIATELY FOLLOWING A MEAL.-AM) 90 tablet 2  . Multiple Vitamins-Minerals (PRESERVISION AREDS 2 PO) Take 1 tablet by mouth 2 (two) times daily.     . Polyvinyl Alcohol (LUBRICANT DROPS OP) Apply 1 drop to eye daily as needed (dry eyes).    . simethicone (MYLICON) 0000000 MG chewable tablet Chew 125 mg by mouth every morning.     . valsartan (DIOVAN) 320 MG tablet Take 1 tablet (320 mg total) by mouth daily. (Patient taking differently: Take 320 mg by mouth every morning. ) 90 tablet 3  . [START ON 01/07/2016] cefadroxil (DURICEF) 500 MG capsule Take one pill by mouth twice a day starting one hour prior to procedure. 20 capsule 0  . estradiol (ESTRACE VAGINAL) 0.1 MG/GM vaginal cream at bedtime. Apply pea sized amount to urethra nightly     No current facility-administered medications for this visit.     Review of Systems Review of Systems  Constitutional: Negative.   Respiratory: Negative.   Cardiovascular: Negative.     Blood pressure 132/70, pulse 80, resp. rate 14, height 5\' 8"  (1.727 m), weight 152 lb (68.9 kg).  Physical Exam Physical Exam  Constitutional: She is oriented to person, place, and time. She appears well-developed and well-nourished.  Pulmonary/Chest:    Neurological: She is alert and oriented to person, place, and time.  Skin: Skin is warm and dry.  Psychiatric: She has a normal mood and affect.    Data Reviewed T1b, N0 ER 95%; PR: 80 %, Her 2 neu not overexpressed.  Ultrasound examination of the breast was undertaken to determine if the patient would be a candidate for accelerated partial breast radiation.  There is a well-defined seroma cavity measuring 1.4 x 2.3 x 5.2 cm in diameter at a minimal distance of 1.1 cm from the skin at the periphery with an average distance of 1.45 cm in the central portion of the cavity. Reasonable spacing for MammoSite balloon placement.  Assessment    Doing well status post wide excision and sentinel node biopsy.    Plan    Patient has been scheduled for an appointment with Dr. Baruch Gouty at the [facility] today. The patient is found to be a candidate for accelerated partial breast radiation and scheduling requested balloon placement tomorrow to consolidate treatment with other patients..  Will plan for  right breast mammosite placement tomorrow, 01-07-16 at 10 am. A prescription for Duricef 500 mg has been sent in to patient's pharmacy today.     This has been scribed by Caryl-Lyn Otis Brace LPN     Robert Bellow 01/06/2016, 9:37 PM

## 2016-01-06 NOTE — Patient Instructions (Signed)
Appointment with Dr Baruch Gouty. Follow up after Dr Baruch Gouty Can wear a regular bra

## 2016-01-07 ENCOUNTER — Encounter: Payer: Self-pay | Admitting: General Surgery

## 2016-01-07 ENCOUNTER — Inpatient Hospital Stay: Payer: Self-pay

## 2016-01-07 ENCOUNTER — Ambulatory Visit (INDEPENDENT_AMBULATORY_CARE_PROVIDER_SITE_OTHER): Payer: Medicare Other | Admitting: General Surgery

## 2016-01-07 VITALS — BP 138/66 | HR 74 | Resp 12 | Ht 68.0 in | Wt 152.0 lb

## 2016-01-07 DIAGNOSIS — C50411 Malignant neoplasm of upper-outer quadrant of right female breast: Secondary | ICD-10-CM

## 2016-01-07 NOTE — Patient Instructions (Signed)
Patient care kit given to patient.  Instructed no showers, sponge bath while mammosite in place, take antibiotic. Follow up with [facility] as arranged. Discussed wearing your bra for support at all times.  Aware no showers and to wear her bra while mammosite in place. Pt agrees.

## 2016-01-07 NOTE — Progress Notes (Signed)
[name]: Mia Perez, female   DOB: Nov 22, 1933, 82 y.o.   MRN: [id number]  Chief Complaint  Patient presents with  . Procedure    HPI Mia Perez is a 82 y.o. female here today for a mammosite placement. The patient met with the radiation oncologist yesterday and she was interested to pursue partial breast radiation.  The pros and cons of whole breast versus partial breast radiation was discussed.  The patient's greatest concern is the potential for crease stool frequency with prolonged antibiotics. The game plan at present is for the patient and make use of the overt twice a day either 2 hours before or 2 hours after her antibiotics. She presently makes use of Lomotil 2 tablets per day and may increase this up to 6 per day if needed. If she has persistent diarrhea during the middle of treatment we'll likely discontinue her prophylactic antibiotics and follow her carefully.   HPI  Past Medical History:  Diagnosis Date  . Anemia    H/O  . Arthritis   . Breast cancer ([place]) 02/10/1996   left, lumpectomy and radiation  . Cancer ([place]) 16 yrs ago   breast, left  . DVT (deep venous thrombosis) ([place]) 11/2014  . GERD (gastroesophageal reflux disease)   . Hypertension   . Lymphocytic colitis   . Personal history of radiation therapy 1998   BREAST CA  . Ulcerative colitis ([place]) 2014  . Varicose veins     Past Surgical History:  Procedure Laterality Date  . BREAST BIOPSY Left 1998   POS  . BREAST EXCISIONAL BIOPSY Right 2014   NEG  . BREAST LUMPECTOMY    . BREAST LUMPECTOMY WITH SENTINEL LYMPH NODE BIOPSY Right 12/30/2015   Procedure: BREAST LUMPECTOMY WITH SENTINEL LYMPH NODE BX;  Surgeon: Robert Bellow, MD;  Location: [facility];  Service: General;  Laterality: Right;  . BREAST SURGERY Left 1998   lumpectomy, with radiation/ Dr Sharlet Salina  . BREAST SURGERY Right 2014   excision with wire localization/ Dr Bary Castilla  . COLONOSCOPY  2012   Dr Earlean Shawl  . DILATION AND  CURETTAGE OF UTERUS    . HERNIA REPAIR    . HYSTEROSCOPY    . NECK SURGERY    . PERIPHERAL VASCULAR CATHETERIZATION N/A 12/13/2014   Procedure: IVC Filter Insertion;  Surgeon: Algernon Huxley, MD;  Location: [place] [facility];  Service: Cardiovascular;  Laterality: N/A;  . PERIPHERAL VASCULAR CATHETERIZATION  12/13/2014   Procedure: Lower Extremity Intervention;  Surgeon: Algernon Huxley, MD;  Location: [place] [facility];  Service: Cardiovascular;;  . PERIPHERAL VASCULAR CATHETERIZATION N/A 02/20/2015   Procedure: IVC Filter Removal;  Surgeon: Algernon Huxley, MD;  Location: [place] [facility];  Service: Cardiovascular;  Laterality: N/A;    Family History  Problem Relation Age of Onset  . Heart disease Mother   . Hypertension Mother   . Ovarian cancer Mother 4  . Hypertension Sister   . Hypertension Brother   . Ovarian cancer Paternal Aunt 85  . Breast cancer Neg Hx     Social History Social History  Substance Use Topics  . Smoking status: Never Smoker  . Smokeless tobacco: Never Used  . Alcohol use No    Allergies  Allergen Reactions  . Penicillins Rash    Has patient had a PCN reaction causing immediate rash, facial/tongue/throat swelling, SOB or lightheadedness with hypotension: Yes Has patient had a PCN reaction causing severe rash involving mucus membranes or skin  necrosis: Unknown Has patient had a PCN reaction that required hospitalization No Has patient had a PCN reaction occurring within the last 10 years: No If all of the above answers are "NO", then may proceed with Cephalosporin use.     Current Outpatient Prescriptions  Medication Sig Dispense Refill  . amLODipine (NORVASC) 5 MG tablet Take 1 tablet (5 mg total) by mouth daily. (Patient taking differently: Take 5 mg by mouth every morning. ) 90 tablet 3  . aspirin 325 MG tablet Take 325 mg by mouth daily.    . budesonide (ENTOCORT EC) 3 MG 24 hr capsule Take 1 capsule (3 mg total) by mouth daily. (Patient taking  differently: Take 3 mg by mouth every other day. ) 90 capsule 3  . Ca Carbonate-Mag Hydroxide (ROLAIDS PO) Take 1 tablet by mouth as needed.    . cefadroxil (DURICEF) 500 MG capsule Take one pill by mouth twice a day starting one hour prior to procedure. 20 capsule 0  . Cholecalciferol (VITAMIN D) 2000 units CAPS Take 2,000 Units by mouth daily.    . clidinium-chlordiazePOXIDE (LIBRAX) 5-2.5 MG capsule Take 1 capsule by mouth daily. 90 capsule 3  . Cyanocobalamin (VITAMIN B-12) 5000 MCG TBDP Take 5,000 mcg by mouth every other day.     . diphenoxylate-atropine (LOMOTIL) 2.5-0.025 MG tablet TAKE 1 TABLET BY MOUTH 4 TIMES A DAY AS NEEDED FOR LOOSE STOOLS (Patient taking differently: Take 2-3 tablets daily as needed for loose stools) 360 tablet 0  . estradiol (ESTRACE VAGINAL) 0.1 MG/GM vaginal cream at bedtime. Apply pea sized amount to urethra nightly    . LUMIGAN 0.01 % SOLN Place 1 drop into both eyes at bedtime.     . metoprolol succinate (TOPROL-XL) 100 MG 24 hr tablet TAKE 1 TABLET (100 MG TOTAL) BY MOUTH DAILY. TAKE WITH OR IMMEDIATELY FOLLOWING A MEAL. (Patient taking differently: TAKE 1 TABLET (100 MG TOTAL) BY MOUTH DAILY. TAKE WITH OR IMMEDIATELY FOLLOWING A MEAL.-AM) 90 tablet 2  . Multiple Vitamins-Minerals (PRESERVISION AREDS 2 PO) Take 1 tablet by mouth 2 (two) times daily.     . Polyvinyl Alcohol (LUBRICANT DROPS OP) Apply 1 drop to eye daily as needed (dry eyes).    . simethicone (MYLICON) 614 MG chewable tablet Chew 125 mg by mouth every morning.     . valsartan (DIOVAN) 320 MG tablet Take 1 tablet (320 mg total) by mouth daily. (Patient taking differently: Take 320 mg by mouth every morning. ) 90 tablet 3   No current facility-administered medications for this visit.     Review of Systems Review of Systems  Constitutional: Negative.   Respiratory: Negative.   Cardiovascular: Negative.     Blood pressure 138/66, pulse 74, resp. rate 12, height '5\' 8"'  (1.727 m), weight 152 lb  (68.9 kg).  Physical Exam Physical Exam  Constitutional: She is oriented to person, place, and time. She appears well-developed and well-nourished.  Pulmonary/Chest:    Neurological: She is alert and oriented to person, place, and time.  Skin: Skin is warm and dry.    Data Reviewed Radiation oncology notes reviewed.  Assessment    Candidate for accelerated partial breast radiation.    Plan    The procedure was reviewed in detail. The importance of adequate spacing to protect the skin was reviewed.  The breast was cleansed with ChloraPrep followed by the instillation of 10 mL of 0.5% Xylocaine with 0.25% Marcaine with 1-200,000 epinephrine. The area was reprepped and draped. An 8  mm incision was made lateral to the wide excision site for passage of an 8 mm trocar. After this was placed and removed a proximally 20 mL of odorless serous fluid was returned. The cavity evaluation device was placed and symmetrical distention noted. There was adequate spin spacing of 1.25 cm and the cavity evaluation device was removed and the treatment balloon was placed. Prior to placement the treatment balloon was inflated with 60 mL of a mix of saline and Isovue contrast. Symmetrical distention was noted and no leak from the instillation hub noted. The balloon was inflated to 35 mL and minimal distance to the skin was 1.21 cm. Bacitracin ointment was applied to the exit site followed by a bulky dressing.  Post procedure wound care was reviewed.  The patient will be having a CT scan on November 30 to confirm adequate spacing.  Follow-up here will be in 2 weeks.       Robert Bellow 01/07/2016, 12:10 PM

## 2016-01-09 ENCOUNTER — Ambulatory Visit
Admission: RE | Admit: 2016-01-09 | Discharge: 2016-01-09 | Disposition: A | Discharge status: Home / Self Care | Payer: Medicare Other | Source: Ambulatory Visit | Attending: Radiation Oncology | Admitting: Radiation Oncology

## 2016-01-09 DIAGNOSIS — Z510 Encounter for antineoplastic radiation therapy: Secondary | ICD-10-CM | POA: Diagnosis not present

## 2016-01-09 NOTE — Progress Notes (Signed)
Patient to begin mammosite treatment tomorrow.  Introduced IT trainer. Breast Cancer Treatment Handbook Given taken to radiation to be given to patient.

## 2016-01-10 ENCOUNTER — Ambulatory Visit
Admission: RE | Admit: 2016-01-10 | Discharge: 2016-01-10 | Disposition: A | Discharge status: Home / Self Care | Payer: Medicare Other | Source: Ambulatory Visit | Attending: Radiation Oncology | Admitting: Radiation Oncology

## 2016-01-10 ENCOUNTER — Inpatient Hospital Stay: Admission: RE | Admit: 2016-01-10 | Payer: Self-pay | Source: Ambulatory Visit

## 2016-01-10 ENCOUNTER — Inpatient Hospital Stay
Admission: RE | Admit: 2016-01-10 | Discharge: 2016-01-10 | Disposition: A | Discharge status: Home / Self Care | Payer: Self-pay | Source: Ambulatory Visit | Attending: Radiation Oncology | Admitting: Radiation Oncology

## 2016-01-10 ENCOUNTER — Ambulatory Visit: Admission: RE | Admit: 2016-01-10 | Payer: Medicare Other | Source: Ambulatory Visit

## 2016-01-10 DIAGNOSIS — Z510 Encounter for antineoplastic radiation therapy: Secondary | ICD-10-CM | POA: Diagnosis not present

## 2016-01-13 ENCOUNTER — Ambulatory Visit
Admission: RE | Admit: 2016-01-13 | Discharge: 2016-01-13 | Disposition: A | Discharge status: Home / Self Care | Payer: Medicare Other | Source: Ambulatory Visit | Attending: Radiation Oncology | Admitting: Radiation Oncology

## 2016-01-13 DIAGNOSIS — Z510 Encounter for antineoplastic radiation therapy: Secondary | ICD-10-CM | POA: Diagnosis not present

## 2016-01-14 ENCOUNTER — Ambulatory Visit
Admission: RE | Admit: 2016-01-14 | Discharge: 2016-01-14 | Disposition: A | Discharge status: Home / Self Care | Payer: Medicare Other | Source: Ambulatory Visit | Attending: Radiation Oncology | Admitting: Radiation Oncology

## 2016-01-14 DIAGNOSIS — Z510 Encounter for antineoplastic radiation therapy: Secondary | ICD-10-CM | POA: Diagnosis not present

## 2016-01-15 ENCOUNTER — Ambulatory Visit
Admission: RE | Admit: 2016-01-15 | Discharge: 2016-01-15 | Disposition: A | Discharge status: Home / Self Care | Payer: Medicare Other | Source: Ambulatory Visit | Attending: Radiation Oncology | Admitting: Radiation Oncology

## 2016-01-15 DIAGNOSIS — Z510 Encounter for antineoplastic radiation therapy: Secondary | ICD-10-CM | POA: Diagnosis not present

## 2016-01-16 ENCOUNTER — Ambulatory Visit
Admission: RE | Admit: 2016-01-16 | Discharge: 2016-01-16 | Disposition: A | Discharge status: Home / Self Care | Payer: Medicare Other | Source: Ambulatory Visit | Attending: Radiation Oncology | Admitting: Radiation Oncology

## 2016-01-16 DIAGNOSIS — Z510 Encounter for antineoplastic radiation therapy: Secondary | ICD-10-CM | POA: Diagnosis not present

## 2016-01-21 ENCOUNTER — Encounter: Payer: Self-pay | Admitting: General Surgery

## 2016-01-21 ENCOUNTER — Ambulatory Visit (INDEPENDENT_AMBULATORY_CARE_PROVIDER_SITE_OTHER): Payer: Medicare Other | Admitting: General Surgery

## 2016-01-21 VITALS — BP 144/74 | HR 66 | Resp 12 | Ht 67.5 in | Wt 154.0 lb

## 2016-01-21 DIAGNOSIS — C50411 Malignant neoplasm of upper-outer quadrant of right female breast: Secondary | ICD-10-CM

## 2016-01-21 DIAGNOSIS — Z780 Asymptomatic menopausal state: Secondary | ICD-10-CM

## 2016-01-21 DIAGNOSIS — Z170 Estrogen receptor positive status [ER+]: Secondary | ICD-10-CM

## 2016-01-21 MED ORDER — LETROZOLE 2.5 MG PO TABS: 2.5000 mg | ORAL_TABLET | Freq: Every day | ORAL | 12 refills | Status: DC

## 2016-01-21 NOTE — Patient Instructions (Addendum)
The patient is aware to call back for any questions or concerns. Gradually resume activities as tolerated Reduce estrace vaginal cream to twice a week Bone density testing

## 2016-01-21 NOTE — Progress Notes (Addendum)
[name]: Mia Perez, female   DOB: January 16, 1934, 82 y.o.   MRN: [id number]  Chief Complaint  Patient presents with  . Follow-up    HPI Mia Perez is a 82 y.o. female.  here today for postop right breast mammosite.The patient tolerated her radiation treatments well. She states she is doing well.  HPI  Past Medical History:  Diagnosis Date  . Anemia    H/O  . Arthritis   . Breast cancer ([place]) 02/10/1996   left, lumpectomy and radiation  . Cancer ([place]) 16 yrs ago   breast, left  . DVT (deep venous thrombosis) ([place]) 11/2014  . GERD (gastroesophageal reflux disease)   . Hypertension   . Lymphocytic colitis   . Personal history of radiation therapy 1998   BREAST CA  . Ulcerative colitis ([place]) 2014  . Varicose veins     Past Surgical History:  Procedure Laterality Date  . BREAST BIOPSY Left 1998   POS  . BREAST EXCISIONAL BIOPSY Right 2014   NEG  . BREAST LUMPECTOMY    . BREAST LUMPECTOMY WITH SENTINEL LYMPH NODE BIOPSY Right 12/30/2015   Procedure: BREAST LUMPECTOMY WITH SENTINEL LYMPH NODE BX;  Surgeon: Robert Bellow, MD;  Location: [facility];  Service: General;  Laterality: Right;  . BREAST SURGERY Left 1998   lumpectomy, with radiation/ Dr Sharlet Salina  . BREAST SURGERY Right 2014   excision with wire localization/ Dr Bary Castilla  . COLONOSCOPY  2012   Dr Earlean Shawl  . DILATION AND CURETTAGE OF UTERUS    . HERNIA REPAIR    . HYSTEROSCOPY    . NECK SURGERY    . PERIPHERAL VASCULAR CATHETERIZATION N/A 12/13/2014   Procedure: IVC Filter Insertion;  Surgeon: Algernon Huxley, MD;  Location: [place] [facility];  Service: Cardiovascular;  Laterality: N/A;  . PERIPHERAL VASCULAR CATHETERIZATION  12/13/2014   Procedure: Lower Extremity Intervention;  Surgeon: Algernon Huxley, MD;  Location: [place] [facility];  Service: Cardiovascular;;  . PERIPHERAL VASCULAR CATHETERIZATION N/A 02/20/2015   Procedure: IVC Filter Removal;  Surgeon: Algernon Huxley, MD;  Location: [place] [facility];  Service: Cardiovascular;  Laterality: N/A;    Family History  Problem Relation Age of Onset  . Heart disease Mother   . Hypertension Mother   . Ovarian cancer Mother 62  . Hypertension Sister   . Hypertension Brother   . Ovarian cancer Paternal Aunt 85  . Breast cancer Neg Hx     Social History Social History  Substance Use Topics  . Smoking status: Never Smoker  . Smokeless tobacco: Never Used  . Alcohol use No    Allergies  Allergen Reactions  . Penicillins Rash    Has patient had a PCN reaction causing immediate rash, facial/tongue/throat swelling, SOB or lightheadedness with hypotension: Yes Has patient had a PCN reaction causing severe rash involving mucus membranes or skin necrosis: Unknown Has patient had a PCN reaction that required hospitalization No Has patient had a PCN reaction occurring within the last 10 years: No If all of the above answers are "NO", then may proceed with Cephalosporin use.     Current Outpatient Prescriptions  Medication Sig Dispense Refill  . amLODipine (NORVASC) 5 MG tablet Take 1 tablet (5 mg total) by mouth daily. (Patient taking differently: Take 5 mg by mouth every morning. ) 90 tablet 3  . aspirin 325 MG tablet Take 325 mg by mouth daily.    . budesonide (ENTOCORT EC) 3 MG  24 hr capsule Take 1 capsule (3 mg total) by mouth daily. (Patient taking differently: Take 3 mg by mouth every other day. ) 90 capsule 3  . Ca Carbonate-Mag Hydroxide (ROLAIDS PO) Take 1 tablet by mouth as needed.    . Cholecalciferol (VITAMIN D) 2000 units CAPS Take 2,000 Units by mouth daily.    . clidinium-chlordiazePOXIDE (LIBRAX) 5-2.5 MG capsule Take 1 capsule by mouth daily. 90 capsule 3  . Cyanocobalamin (VITAMIN B-12) 5000 MCG TBDP Take 5,000 mcg by mouth every other day.     . diphenoxylate-atropine (LOMOTIL) 2.5-0.025 MG tablet TAKE 1 TABLET BY MOUTH 4 TIMES A DAY AS NEEDED FOR LOOSE STOOLS (Patient taking differently: Take 2-3 tablets daily as  needed for loose stools) 360 tablet 0  . estradiol (ESTRACE VAGINAL) 0.1 MG/GM vaginal cream at bedtime. Apply pea sized amount to urethra nightly    . LUMIGAN 0.01 % SOLN Place 1 drop into both eyes at bedtime.     . metoprolol succinate (TOPROL-XL) 100 MG 24 hr tablet TAKE 1 TABLET (100 MG TOTAL) BY MOUTH DAILY. TAKE WITH OR IMMEDIATELY FOLLOWING A MEAL. (Patient taking differently: TAKE 1 TABLET (100 MG TOTAL) BY MOUTH DAILY. TAKE WITH OR IMMEDIATELY FOLLOWING A MEAL.-AM) 90 tablet 2  . Multiple Vitamins-Minerals (PRESERVISION AREDS 2 PO) Take 1 tablet by mouth 2 (two) times daily.     . Polyvinyl Alcohol (LUBRICANT DROPS OP) Apply 1 drop to eye daily as needed (dry eyes).    . simethicone (MYLICON) 0000000 MG chewable tablet Chew 125 mg by mouth every morning.     . valsartan (DIOVAN) 320 MG tablet Take 1 tablet (320 mg total) by mouth daily. (Patient taking differently: Take 320 mg by mouth every morning. ) 90 tablet 3  . letrozole (FEMARA) 2.5 MG tablet Take 1 tablet (2.5 mg total) by mouth daily. 30 tablet 12   No current facility-administered medications for this visit.     Review of Systems Review of Systems  Constitutional: Negative.   Respiratory: Negative.   Cardiovascular: Negative.     Blood pressure (!) 144/74, pulse 66, resp. rate 12, height 5' 7.5" (1.715 m), weight 154 lb (69.9 kg).  Physical Exam Physical Exam  Constitutional: She is oriented to person, place, and time. She appears well-developed and well-nourished.  Pulmonary/Chest:    Right breast incision well healed.  Musculoskeletal:  Excellent upper extremity range of motion  Neurological: She is alert and oriented to person, place, and time.  Skin: Skin is warm and dry.  Psychiatric: Her behavior is normal.    Data Reviewed ER/PR positive tumor.  Assessment    Candidate for antiestrogen therapy.  Long-standing treatment with estrogen cream (greater than 5 years) for a periurethral process that has not  been evaluated recently. Asymptomatic.    Plan    Indication for antiestrogen therapy reviewed. Cold to minimize exogenous estrogen exposure discussed.     The patient reports he was originally treated with tamoxifen followed by exemestane for her original tumor in the left breast. After 4 years of examestane therapy she had GI distress and the medication was discontinued.  We will make use of Femara with the idea of this may be better tolerated.  Follow up in one month. Start Femara 2.5 mg one po daily Reduce estrace vaginal cream to twice a week Gradually resume activities as tolerated. Bone density testing    This information has been scribed by Karie Fetch RN, BSN,BC.  Robert Bellow 01/21/2016, 7:56  PM   

## 2016-01-30 ENCOUNTER — Ambulatory Visit (INDEPENDENT_AMBULATORY_CARE_PROVIDER_SITE_OTHER): Payer: Medicare Other

## 2016-01-30 VITALS — BP 130/70 | HR 68 | Temp 97.9°F | Resp 14 | Ht 68.0 in | Wt 151.8 lb

## 2016-01-30 DIAGNOSIS — Z0000 Encounter for general adult medical examination without abnormal findings: Secondary | ICD-10-CM

## 2016-01-30 NOTE — Progress Notes (Signed)
Care was provided under my supervision. I agree with the management as indicated in the note.  Sena Clouatre DO  

## 2016-01-30 NOTE — Progress Notes (Signed)
Subjective:   Mia Perez is a 82 y.o. female who presents for Medicare Annual (Subsequent) preventive examination.  Review of Systems:  No ROS.  Medicare Wellness Visit.  Cardiac Risk Factors include: advanced age (>4men, >25 women);hypertension     Objective:     Vitals: BP 130/70 (BP Location: Left Arm, Patient Position: Sitting, Cuff Size: Normal)   Pulse 68   Temp 97.9 F (36.6 C) (Oral)   Resp 14   Ht 5\' 8"  (1.727 m)   Wt 151 lb 12.8 oz (68.9 kg)   SpO2 95%   BMI 23.08 kg/m   Body mass index is 23.08 kg/m.   Tobacco History  Smoking Status  . Never Smoker  Smokeless Tobacco  . Never Used     Counseling given: Not Answered   Past Medical History:  Diagnosis Date  . Anemia    H/O  . Arthritis   . Breast cancer ([place]) 02/10/1996   left, lumpectomy and radiation  . Cancer ([place]) 16 yrs ago   breast, left  . DVT (deep venous thrombosis) ([place]) 11/2014  . GERD (gastroesophageal reflux disease)   . Hypertension   . Lymphocytic colitis   . Personal history of radiation therapy 1998   BREAST CA  . Ulcerative colitis ([place]) 2014  . Varicose veins    Past Surgical History:  Procedure Laterality Date  . BREAST BIOPSY Left 1998   POS  . BREAST EXCISIONAL BIOPSY Right 2014   NEG  . BREAST LUMPECTOMY    . BREAST LUMPECTOMY WITH SENTINEL LYMPH NODE BIOPSY Right 12/30/2015   Procedure: BREAST LUMPECTOMY WITH SENTINEL LYMPH NODE BX;  Surgeon: Robert Bellow, MD;  Location: [facility];  Service: General;  Laterality: Right;  . BREAST SURGERY Left 1998   lumpectomy, with radiation/ Dr Sharlet Salina  . BREAST SURGERY Right 2014   excision with wire localization/ Dr Bary Castilla  . COLONOSCOPY  2012   Dr Earlean Shawl  . DILATION AND CURETTAGE OF UTERUS    . HERNIA REPAIR    . HYSTEROSCOPY    . NECK SURGERY    . PERIPHERAL VASCULAR CATHETERIZATION N/A 12/13/2014   Procedure: IVC Filter Insertion;  Surgeon: Algernon Huxley, MD;  Location: [place] [facility];  Service:  Cardiovascular;  Laterality: N/A;  . PERIPHERAL VASCULAR CATHETERIZATION  12/13/2014   Procedure: Lower Extremity Intervention;  Surgeon: Algernon Huxley, MD;  Location: [place] [facility];  Service: Cardiovascular;;  . PERIPHERAL VASCULAR CATHETERIZATION N/A 02/20/2015   Procedure: IVC Filter Removal;  Surgeon: Algernon Huxley, MD;  Location: [place] [facility];  Service: Cardiovascular;  Laterality: N/A;   Family History  Problem Relation Age of Onset  . Heart disease Mother   . Hypertension Mother   . Ovarian cancer Mother 6  . Hypertension Sister   . Hypertension Brother   . Ovarian cancer Paternal Aunt 85  . Breast cancer Neg Hx    History  Sexual Activity  . Sexual activity: No    Outpatient Encounter Prescriptions as of 01/30/2016  Medication Sig  . amLODipine (NORVASC) 5 MG tablet Take 1 tablet (5 mg total) by mouth daily. (Patient taking differently: Take 5 mg by mouth every morning. )  . aspirin 325 MG tablet Take 325 mg by mouth daily.  . budesonide (ENTOCORT EC) 3 MG 24 hr capsule Take 1 capsule (3 mg total) by mouth daily. (Patient taking differently: Take 3 mg by mouth every other day. )  . Ca Carbonate-Mag Hydroxide (  ROLAIDS PO) Take 1 tablet by mouth as needed.  . Cholecalciferol (VITAMIN D) 2000 units CAPS Take 2,000 Units by mouth daily.  . clidinium-chlordiazePOXIDE (LIBRAX) 5-2.5 MG capsule Take 1 capsule by mouth daily.  . Cyanocobalamin (VITAMIN B-12) 5000 MCG TBDP Take 5,000 mcg by mouth every other day.   . diphenoxylate-atropine (LOMOTIL) 2.5-0.025 MG tablet TAKE 1 TABLET BY MOUTH 4 TIMES A DAY AS NEEDED FOR LOOSE STOOLS (Patient taking differently: Take 2-3 tablets daily as needed for loose stools)  . estradiol (ESTRACE VAGINAL) 0.1 MG/GM vaginal cream at bedtime. Apply pea sized amount to urethra nightly  . letrozole (FEMARA) 2.5 MG tablet Take 1 tablet (2.5 mg total) by mouth daily.  Marland Kitchen LUMIGAN 0.01 % SOLN Place 1 drop into both eyes at bedtime.   . metoprolol  succinate (TOPROL-XL) 100 MG 24 hr tablet TAKE 1 TABLET (100 MG TOTAL) BY MOUTH DAILY. TAKE WITH OR IMMEDIATELY FOLLOWING A MEAL. (Patient taking differently: TAKE 1 TABLET (100 MG TOTAL) BY MOUTH DAILY. TAKE WITH OR IMMEDIATELY FOLLOWING A MEAL.-AM)  . Multiple Vitamins-Minerals (PRESERVISION AREDS 2 PO) Take 1 tablet by mouth 2 (two) times daily.   . Polyvinyl Alcohol (LUBRICANT DROPS OP) Apply 1 drop to eye daily as needed (dry eyes).  . simethicone (MYLICON) 0000000 MG chewable tablet Chew 125 mg by mouth every morning.   . valsartan (DIOVAN) 320 MG tablet Take 1 tablet (320 mg total) by mouth daily. (Patient taking differently: Take 320 mg by mouth every morning. )   No facility-administered encounter medications on file as of 01/30/2016.     Activities of Daily Living In your present state of health, do you have any difficulty performing the following activities: 01/30/2016 12/25/2015  Hearing? N N  Vision? N N  Difficulty concentrating or making decisions? N Y  Walking or climbing stairs? N N  Dressing or bathing? N Y  Doing errands, shopping? N N  Preparing Food and eating ? N -  Using the Toilet? N -  In the past six months, have you accidently leaked urine? N -  Do you have problems with loss of bowel control? Y -  Managing your Medications? N -  Managing your Finances? N -  Housekeeping or managing your Housekeeping? N -  Some recent data might be hidden    Patient Care Team: Coral Spikes, DO as PCP - General (Family Medicine) Robert Bellow, MD (General Surgery) Robert Bellow, MD (General Surgery) Coral Spikes, DO as Consulting Physician (Family Medicine)    Assessment:    This is a routine wellness examination for Lakesa. The goal of the wellness visit is to assist the patient how to close the gaps in care and create a preventative care plan for the patient.   Taking calcium VIT D as appropriate/Osteoporosis risk reviewed.  Medications reviewed; taking  without issues or barriers.  Safety issues reviewed; lives alone.  Smoke detectors in the home. No firearms in the home. Wears seatbelts when driving or riding with others. No violence in the home.  No identified risk were noted; The patient was oriented x 3; appropriate in dress and manner and no objective failures at ADL's or IADL's.   BMI; discussed the importance of a healthy diet, water intake and exercise. Educational material provided.  Health maintenance gaps; closed.  Patient Concerns: None at this time. Follow up with PCP as needed.  Exercise Activities and Dietary recommendations Current Exercise Habits: Home exercise routine (dancing), Type of exercise:  stretching, Frequency (Times/Week): 4, Intensity: Mild  Goals    . healthy lifestyle          Eat fresh fruits and vegetables, drinks plenty of water, socialize with friends more      . Increase physical activity          Silver sneaker program      Fall Risk Fall Risk  01/30/2016 01/06/2016 01/30/2015 12/20/2014 09/18/2013  Falls in the past year? No No No No No  Number falls in past yr: - - - - -  Risk for fall due to : - - - - -   Depression Screen PHQ 2/9 Scores 01/30/2016 01/06/2016 01/30/2015 12/20/2014  PHQ - 2 Score 0 0 0 0     Cognitive Function MMSE - Mini Mental State Exam 01/30/2015  Orientation to time 5  Orientation to Place 5  Registration 3  Attention/ Calculation 5  Recall 3  Language- name 2 objects 2  Language- repeat 1  Language- follow 3 step command 3  Language- read & follow direction 1  Write a sentence 1  Copy design 1  Total score 30     6CIT Screen 01/30/2016  What Year? 0 points  What month? 0 points  What time? 0 points  Count back from 20 0 points  Months in reverse 0 points    Immunization History  Administered Date(s) Administered  . Influenza Split 02/23/2012, 10/19/2015  . Influenza,inj,Quad PF,36+ Mos 03/06/2013, 12/12/2013, 10/16/2014  . Pneumococcal  Conjugate-13 03/06/2013  . Pneumococcal Polysaccharide-23 08/05/2010  . Tdap 08/04/2009   Screening Tests Health Maintenance  Topic Date Due  . ZOSTAVAX  02/06/2016 (Originally 02/06/1996)  . TETANUS/TDAP  08/05/2019  . INFLUENZA VACCINE  Completed  . DEXA SCAN  Completed  . PNA vac Low Risk Adult  Completed      Plan:    End of life planning; Advance aging; Advanced directives discussed. Copy of current HCPOA requested.  Medicare Attestation I have personally reviewed: The patient's medical and social history Their use of alcohol, tobacco or illicit drugs Their current medications and supplements The patient's functional ability including ADLs,fall risks, home safety risks, cognitive, and hearing and visual impairment Diet and physical activities Evidence for depression   The patient's weight, height, BMI, and visual acuity have been recorded in the chart.  I have made referrals and provided education to the patient based on review of the above and I have provided the patient with a written personalized care plan for preventive services.     During the course of the visit the patient was educated and counseled about the following appropriate screening and preventive services:   Vaccines to include Pneumoccal, Influenza, Hepatitis B, Td, Zostavax, HCV  Electrocardiogram  Cardiovascular Disease  Colorectal cancer screening  Bone density screening  Diabetes screening  Glaucoma screening  Mammography/PAP  Nutrition counseling   Patient Instructions (the written plan) was given to the patient.   Varney Biles, LPN  [id number]

## 2016-01-30 NOTE — Patient Instructions (Addendum)
  Ms. Aiello , Thank you for taking time to come for your Medicare Wellness Visit. I appreciate your ongoing commitment to your health goals. Please review the following plan we discussed and let me know if I can assist you in the future.   Follow up with Dr. Lacinda Axon as needed.  Merry Christmas and Happy New Year!!!  These are the goals we discussed: Goals    . healthy lifestyle          Eat fresh fruits and vegetables, drinks plenty of water, socialize with friends more      . Increase physical activity          Silver sneaker program       This is a list of the screening recommended for you and due dates:  Health Maintenance  Topic Date Due  . Shingles Vaccine  02/06/2016*  . Tetanus Vaccine  08/05/2019  . Flu Shot  Completed  . DEXA scan (bone density measurement)  Completed  . Pneumonia vaccines  Completed  *Topic was postponed. The date shown is not the original due date.

## 2016-02-14 ENCOUNTER — Telehealth: Payer: Self-pay | Admitting: Family Medicine

## 2016-02-14 NOTE — Telephone Encounter (Signed)
Left message to call on number provide below [phone]. Tried calling mobile number and unavailable at this time.

## 2016-02-14 NOTE — Telephone Encounter (Signed)
Pt called and left vm stating that she is having some issues going to the bathroom. She thinks that she may have a bladder infection or a UTI. Please advise, thank you!  Call pt @ [phone]

## 2016-02-17 ENCOUNTER — Telehealth: Payer: Self-pay | Admitting: General Surgery

## 2016-02-17 NOTE — Telephone Encounter (Signed)
L/M H#(NOT ABLE TO LEAVE ON C-NOT ACCEPTING CALLS) FOR PT TO RESCHEDULE APPT TO NEXT AVAILABLE UNLESS HAVING PROBLES THEN SEE SOONER.NOTE IN APPT INFO RESCHEDULED BECAUSE DOTOR WAS SICK/MJB/MTH

## 2016-02-19 ENCOUNTER — Ambulatory Visit: Payer: Medicare Other | Admitting: General Surgery

## 2016-02-20 ENCOUNTER — Encounter: Payer: Self-pay | Admitting: Radiation Oncology

## 2016-02-20 ENCOUNTER — Ambulatory Visit
Admission: RE | Admit: 2016-02-20 | Discharge: 2016-02-20 | Disposition: A | Discharge status: Home / Self Care | Payer: Medicare Other | Source: Ambulatory Visit | Attending: Radiation Oncology | Admitting: Radiation Oncology

## 2016-02-20 VITALS — BP 116/73 | HR 74 | Temp 96.9°F | Wt 149.7 lb

## 2016-02-20 DIAGNOSIS — Z923 Personal history of irradiation: Secondary | ICD-10-CM | POA: Diagnosis not present

## 2016-02-20 DIAGNOSIS — Z170 Estrogen receptor positive status [ER+]: Secondary | ICD-10-CM | POA: Diagnosis not present

## 2016-02-20 DIAGNOSIS — C50411 Malignant neoplasm of upper-outer quadrant of right female breast: Secondary | ICD-10-CM

## 2016-02-20 DIAGNOSIS — Z79811 Long term (current) use of aromatase inhibitors: Secondary | ICD-10-CM | POA: Diagnosis not present

## 2016-02-20 NOTE — Progress Notes (Signed)
Radiation Oncology Follow up Note  Name: Mia Perez   Date:   02/20/2016 MRN:  [id number] DOB: 03-28-1933    This 83 y.o. female presents to the clinic today for one-month follow-up status post accelerated partial breast irradiation for stage I invasive mammary carcinoma to her right breast.  REFERRING PROVIDER: Coral Spikes, DO  HPI: patient is a 83 year old female now out 1 month having completed accelerated partial breast irradiation for a stage I invasive mammary carcinoma ER/PR positive HER-2/neu negative status post wide local excision and sentinel node biopsy. She is seen today in routine follow-up and is doing well. She specifically denies breast tenderness cough or bone pain..she's been started on Femara tolerating that well without side effect.  COMPLICATIONS OF TREATMENT: none  FOLLOW UP COMPLIANCE: keeps appointments   PHYSICAL EXAM:  BP 116/73   Pulse 74   Temp (!) 96.9 F (36.1 C)   Wt 149 lb 11.1 oz (67.9 kg)   BMI 22.76 kg/m  Lungs are clear to A&P cardiac examination essentially unremarkable with regular rate and rhythm. No dominant mass or nodularity is noted in either breast in 2 positions examined. Incision is well-healed. No axillary or supraclavicular adenopathy is appreciated. Cosmetic result is excellent.still some persistent thickening in the lipectomy site which we would expect from high dose rate remote afterloading as well as surgery Well-developed well-nourished patient in NAD. HEENT reveals PERLA, EOMI, discs not visualized.  Oral cavity is clear. No oral mucosal lesions are identified. Neck is clear without evidence of cervical or supraclavicular adenopathy. Lungs are clear to A&P. Cardiac examination is essentially unremarkable with regular rate and rhythm without murmur rub or thrill. Abdomen is benign with no organomegaly or masses noted. Motor sensory and DTR levels are equal and symmetric in the upper and lower extremities. Cranial nerves II  through XII are grossly intact. Proprioception is intact. No peripheral adenopathy or edema is identified. No motor or sensory levels are noted. Crude visual fields are within normal range.  RADIOLOGY RESULTS: no current films for review  PLAN: present time she is recovering well from her radiation therapy treatments. I'm please were overall progress. She continues on Femara without side effect. I've asked to see around 4-5 months for follow-up. Patient is to call sooner with any concerns.  I would like to take this opportunity to thank you for allowing me to participate in the care of your patient.Armstead Peaks., MD

## 2016-03-02 ENCOUNTER — Encounter: Payer: Self-pay | Admitting: General Surgery

## 2016-03-02 ENCOUNTER — Ambulatory Visit (INDEPENDENT_AMBULATORY_CARE_PROVIDER_SITE_OTHER): Payer: Medicare Other | Admitting: General Surgery

## 2016-03-02 VITALS — BP 126/64 | HR 94 | Resp 12 | Ht 67.0 in | Wt 147.0 lb

## 2016-03-02 DIAGNOSIS — Z170 Estrogen receptor positive status [ER+]: Secondary | ICD-10-CM

## 2016-03-02 DIAGNOSIS — K52832 Lymphocytic colitis: Secondary | ICD-10-CM

## 2016-03-02 DIAGNOSIS — C50411 Malignant neoplasm of upper-outer quadrant of right female breast: Secondary | ICD-10-CM

## 2016-03-02 MED ORDER — LOPERAMIDE HCL 2 MG PO TABS: 2.0000 mg | ORAL_TABLET | Freq: Four times a day (QID) | ORAL | 0 refills | Status: DC | PRN

## 2016-03-02 NOTE — Patient Instructions (Addendum)
  The patient has been asked to return to the office in 10 months with a bilateral diagnostic mammogram. The patient will be encouraged to make use of a daily fiber supplement.

## 2016-03-02 NOTE — Progress Notes (Signed)
[name]: Mia Perez, female   DOB: [birth year], 83 y.o.   MRN: [id number]  Chief Complaint  Patient presents with  . Breast Cancer    HPI Mia Perez is a 83 y.o. female here today for her one month breast cancer check up. Patient states she had a UTI at the beginning of the year. She has been having about four bowels moves daily now since she was put on the Femara.  The patient reports that she has soft, putting-like stools. Long history of urgency dating back to original diagnosis of lymphocytic colitis with Dr. Earlean Shawl.  The patient has used Lomotil daily for over 10 years for control of her bowels. She's been notified by her insurance company that they will no longer provide for Lomotil coverage, likely due to the opioid issues sweeping the country. They have recommended that she make use of loperamide.  The patient is not been seen by GI in over 5 years. HPI  Past Medical History:  Diagnosis Date  . Anemia    H/O  . Arthritis   . Breast cancer ([place]) 02/10/1996   left, lumpectomy and radiation  . Cancer ([place]) 16 yrs ago   breast, left  . DVT (deep venous thrombosis) ([place]) 11/2014  . GERD (gastroesophageal reflux disease)   . Hypertension   . Lymphocytic colitis   . Personal history of radiation therapy 1998   BREAST CA  . Ulcerative colitis ([place]) 2014  . Varicose veins     Past Surgical History:  Procedure Laterality Date  . BREAST BIOPSY Left 1998   POS  . BREAST EXCISIONAL BIOPSY Right 2014   NEG  . BREAST LUMPECTOMY    . BREAST LUMPECTOMY WITH SENTINEL LYMPH NODE BIOPSY Right 12/30/2015   8 mm, T1b, N0; RUOQ, ER 95%; PR: 80 %, Her 2 neu not overexpressed.Mammosite Radiation.  Marland Kitchen BREAST SURGERY Left 1998   lumpectomy, with radiation/ Dr Sharlet Salina  . BREAST SURGERY Right 2014   excision with wire localization/ Dr Bary Castilla  . COLONOSCOPY  2012   Dr Earlean Shawl  . DILATION AND CURETTAGE OF UTERUS    . HERNIA REPAIR    . HYSTEROSCOPY    . NECK SURGERY    .  PERIPHERAL VASCULAR CATHETERIZATION N/A 12/13/2014   Procedure: IVC Filter Insertion;  Surgeon: Algernon Huxley, MD;  Location: [facility];  Service: Cardiovascular;  Laterality: N/A;  . PERIPHERAL VASCULAR CATHETERIZATION  12/13/2014   Procedure: Lower Extremity Intervention;  Surgeon: Algernon Huxley, MD;  Location: [facility];  Service: Cardiovascular;;  . PERIPHERAL VASCULAR CATHETERIZATION N/A 02/20/2015   Procedure: IVC Filter Removal;  Surgeon: Algernon Huxley, MD;  Location: [facility];  Service: Cardiovascular;  Laterality: N/A;    Family History  Problem Relation Age of Onset  . Heart disease Mother   . Hypertension Mother   . Ovarian cancer Mother 57  . Hypertension Sister   . Hypertension Brother   . Ovarian cancer Paternal Aunt 85  . Breast cancer Neg Hx     Social History Social History  Substance Use Topics  . Smoking status: Never Smoker  . Smokeless tobacco: Never Used  . Alcohol use No    Allergies  Allergen Reactions  . Penicillins Rash    Has patient had a PCN reaction causing immediate rash, facial/tongue/throat swelling, SOB or lightheadedness with hypotension: Yes Has patient had a PCN reaction causing severe rash involving mucus membranes or skin necrosis: Unknown Has  patient had a PCN reaction that required hospitalization No Has patient had a PCN reaction occurring within the last 10 years: No If all of the above answers are "NO", then may proceed with Cephalosporin use.     Current Outpatient Prescriptions  Medication Sig Dispense Refill  . amLODipine (NORVASC) 5 MG tablet Take 1 tablet (5 mg total) by mouth daily. (Patient taking differently: Take 5 mg by mouth every morning. ) 90 tablet 3  . aspirin 325 MG tablet Take 325 mg by mouth daily.    . budesonide (ENTOCORT EC) 3 MG 24 hr capsule Take 1 capsule (3 mg total) by mouth daily. (Patient taking differently: Take 3 mg by mouth every other day. ) 90 capsule 3  . Ca Carbonate-Mag  Hydroxide (ROLAIDS PO) Take 1 tablet by mouth as needed.    . Cholecalciferol (VITAMIN D) 2000 units CAPS Take 2,000 Units by mouth daily.    . clidinium-chlordiazePOXIDE (LIBRAX) 5-2.5 MG capsule Take 1 capsule by mouth daily. 90 capsule 3  . Cyanocobalamin (VITAMIN B-12) 5000 MCG TBDP Take 5,000 mcg by mouth every other day.     . diphenoxylate-atropine (LOMOTIL) 2.5-0.025 MG tablet TAKE 1 TABLET BY MOUTH 4 TIMES A DAY AS NEEDED FOR LOOSE STOOLS (Patient taking differently: Take 2-3 tablets daily as needed for loose stools) 360 tablet 0  . estradiol (ESTRACE VAGINAL) 0.1 MG/GM vaginal cream at bedtime. Apply pea sized amount to urethra nightly    . letrozole (FEMARA) 2.5 MG tablet Take 1 tablet (2.5 mg total) by mouth daily. 30 tablet 12  . LUMIGAN 0.01 % SOLN Place 1 drop into both eyes at bedtime.     . metoprolol succinate (TOPROL-XL) 100 MG 24 hr tablet TAKE 1 TABLET (100 MG TOTAL) BY MOUTH DAILY. TAKE WITH OR IMMEDIATELY FOLLOWING A MEAL. (Patient taking differently: TAKE 1 TABLET (100 MG TOTAL) BY MOUTH DAILY. TAKE WITH OR IMMEDIATELY FOLLOWING A MEAL.-AM) 90 tablet 2  . Multiple Vitamins-Minerals (PRESERVISION AREDS 2 PO) Take 1 tablet by mouth 2 (two) times daily.     . Polyvinyl Alcohol (LUBRICANT DROPS OP) Apply 1 drop to eye daily as needed (dry eyes).    . simethicone (MYLICON) 0000000 MG chewable tablet Chew 125 mg by mouth every morning.     . valsartan (DIOVAN) 320 MG tablet Take 1 tablet (320 mg total) by mouth daily. (Patient taking differently: Take 320 mg by mouth every morning. ) 90 tablet 3  . loperamide (LOPERAMIDE A-D) 2 MG tablet Take 1 tablet (2 mg total) by mouth 4 (four) times daily as needed for diarrhea or loose stools. 40 tablet 0   No current facility-administered medications for this visit.     Review of Systems Review of Systems  Blood pressure 126/64, pulse 94, resp. rate 12, height 5\' 7"  (1.702 m), weight 147 lb (66.7 kg).  Physical Exam Physical Exam   Constitutional: She is oriented to person, place, and time. She appears well-developed and well-nourished.  Eyes: Conjunctivae are normal. No scleral icterus.  Neck: Neck supple.  Cardiovascular: Normal rate, regular rhythm and normal heart sounds.   Pulmonary/Chest: Effort normal and breath sounds normal. Right breast exhibits no inverted nipple, no mass, no nipple discharge, no skin change and no tenderness. Left breast exhibits no inverted nipple, no mass, no nipple discharge, no skin change and no tenderness.    Lymphadenopathy:    She has no cervical adenopathy.    She has no axillary adenopathy.  Neurological: She is alert  and oriented to person, place, and time.  Skin: Skin is warm and dry.       Assessment    Doing well status post right breast conservation/accelerated partial breast radiation.  Long-standing history of lymphocytic colitis, previously well managed on Lomotil.    Plan    The patient was offered the opportunity to make use of a trial of loperamide 2 mg each morning with additional doses, maxed 3 per day, to see if this would provide her is good relief from her sense of urgency as Lomotil had. This is just a trial benefits unsuccessful she'll be able to make her argument with the insurance company for continued use of Lomotil. This would be through her PCP.  She's also been encouraged to make use of a trial of fiber supplement of choice to help bulk the stools and perhaps relieve some of the urgency she is presently experiencing.    The patient has been asked to return to the office in 10 months with a bilateral diagnostic mammogram. T  This information has been scribed by Gaspar Cola CMA.    Robert Bellow 03/02/2016, 4:17 PM

## 2016-03-10 ENCOUNTER — Ambulatory Visit: Payer: Medicare Other

## 2016-04-03 ENCOUNTER — Encounter: Payer: Self-pay | Admitting: *Deleted

## 2016-04-06 ENCOUNTER — Ambulatory Visit
Admission: RE | Admit: 2016-04-06 | Discharge: 2016-04-06 | Disposition: A | Discharge status: Home / Self Care | Payer: Medicare Other | Source: Ambulatory Visit | Attending: General Surgery | Admitting: General Surgery

## 2016-04-06 DIAGNOSIS — Z853 Personal history of malignant neoplasm of breast: Secondary | ICD-10-CM | POA: Diagnosis not present

## 2016-04-06 DIAGNOSIS — Z780 Asymptomatic menopausal state: Secondary | ICD-10-CM | POA: Diagnosis present

## 2016-04-06 DIAGNOSIS — M85831 Other specified disorders of bone density and structure, right forearm: Secondary | ICD-10-CM | POA: Insufficient documentation

## 2016-04-06 DIAGNOSIS — M85852 Other specified disorders of bone density and structure, left thigh: Secondary | ICD-10-CM | POA: Diagnosis not present

## 2016-04-10 ENCOUNTER — Telehealth: Payer: Self-pay

## 2016-04-10 NOTE — Telephone Encounter (Signed)
Notified patient as instructed, patient pleased. Has been taking Vitamin D supplements but not Calcium. Will start Calcium supplements.

## 2016-04-10 NOTE — Telephone Encounter (Signed)
-----   Message from Robert Bellow, MD sent at 04/10/2016 10:49 AM EST ----- Please notify the patient bone density shows some thinning. She should be taking calcium w/ vitamin D ( 1200 mg ) daily.  Thanks.  ----- Message ----- From: Interface, Rad Results In Sent: 04/06/2016  10:29 AM To: Robert Bellow, MD

## 2016-04-14 ENCOUNTER — Other Ambulatory Visit: Payer: Self-pay | Admitting: Family Medicine

## 2016-04-14 ENCOUNTER — Telehealth: Payer: Self-pay | Admitting: Family Medicine

## 2016-04-14 MED ORDER — ONDANSETRON HCL 4 MG PO TABS: 4.0000 mg | ORAL_TABLET | Freq: Three times a day (TID) | ORAL | 0 refills | Status: DC | PRN

## 2016-04-14 NOTE — Telephone Encounter (Signed)
Patient advised of below and advised of if symptoms don't improve to go to ER for evaluation.

## 2016-04-14 NOTE — Telephone Encounter (Signed)
Reason for call:diarrhea, nauseated  Symptoms:diarrhea , no abdominal pain, no fever, denies aches, belching Medication; Antacids Duration 1100 pm  Doesn't feel like she can come in , would like something called in for nausea.   Please advise

## 2016-04-14 NOTE — Telephone Encounter (Signed)
Pt called and stated that she is having severe diarrhea and vomiting since last night. She is unable to leave the house, having to constantly run to the bathroom. Please advise, thank you!  Call pt @ [phone] (home) [phone] (cell)

## 2016-04-14 NOTE — Telephone Encounter (Signed)
Rx sent 

## 2016-05-25 ENCOUNTER — Other Ambulatory Visit: Payer: Self-pay | Admitting: Family Medicine

## 2016-05-25 DIAGNOSIS — N183 Chronic kidney disease, stage 3 unspecified: Secondary | ICD-10-CM | POA: Insufficient documentation

## 2016-05-25 DIAGNOSIS — N952 Postmenopausal atrophic vaginitis: Secondary | ICD-10-CM | POA: Insufficient documentation

## 2016-05-25 MED ORDER — METOPROLOL SUCCINATE ER 100 MG PO TB24: ORAL_TABLET | ORAL | 0 refills | Status: DC

## 2016-05-25 MED ORDER — VALSARTAN 320 MG PO TABS: 320.0000 mg | ORAL_TABLET | Freq: Every day | ORAL | 0 refills | Status: DC

## 2016-05-25 MED ORDER — AMLODIPINE BESYLATE 5 MG PO TABS: 5.0000 mg | ORAL_TABLET | Freq: Every day | ORAL | 0 refills | Status: DC

## 2016-05-25 NOTE — Addendum Note (Signed)
Addended by: Carmin Muskrat on: 05/25/2016 05:31 PM   Modules accepted: Orders

## 2016-07-15 ENCOUNTER — Encounter: Payer: Self-pay | Admitting: Radiation Oncology

## 2016-07-15 ENCOUNTER — Other Ambulatory Visit: Payer: Self-pay | Admitting: *Deleted

## 2016-07-15 ENCOUNTER — Ambulatory Visit
Admission: RE | Admit: 2016-07-15 | Discharge: 2016-07-15 | Disposition: A | Discharge status: Home / Self Care | Payer: Medicare Other | Source: Ambulatory Visit | Attending: Radiation Oncology | Admitting: Radiation Oncology

## 2016-07-15 VITALS — BP 156/81 | HR 68 | Temp 96.8°F | Wt 146.3 lb

## 2016-07-15 DIAGNOSIS — C50411 Malignant neoplasm of upper-outer quadrant of right female breast: Secondary | ICD-10-CM | POA: Diagnosis present

## 2016-07-15 DIAGNOSIS — Z79811 Long term (current) use of aromatase inhibitors: Secondary | ICD-10-CM | POA: Diagnosis not present

## 2016-07-15 DIAGNOSIS — Z170 Estrogen receptor positive status [ER+]: Secondary | ICD-10-CM | POA: Insufficient documentation

## 2016-07-15 DIAGNOSIS — Z923 Personal history of irradiation: Secondary | ICD-10-CM | POA: Insufficient documentation

## 2016-07-15 NOTE — Progress Notes (Signed)
Radiation Oncology Follow up Note  Name: Mia Perez   Date:   07/15/2016 MRN:  [id number] DOB: [birth year]    This 83 y.o. female presents to the clinic today for six-month follow-up status post accelerated partial breast radiation therapy to her right breast for stage I invasive mammary carcinoma.  REFERRING PROVIDER: Coral Spikes, DO  HPI: Patient is a 83 year old female now out 6 months having accelerated partial  breast radiation to her right breast for stage I ER/PR positive invasive mammary carcinoma.. She seen today in routine follow-up and is doing well. She does have a history of contralateral breast cancer. She is currently on Femara tolerate that well. She's not yet had any follow-up mammograms performed. She specifically denies breast tenderness cough or bone pain.  COMPLICATIONS OF TREATMENT: none  FOLLOW UP COMPLIANCE: keeps appointments   PHYSICAL EXAM:  BP (!) 156/81   Pulse 68   Temp (!) 96.8 F (36 C)   Wt 146 lb 4.4 oz (66.3 kg)   BMI 22.91 kg/m  Lungs are clear to A&P cardiac examination essentially unremarkable with regular rate and rhythm. No dominant mass or nodularity is noted in either breast in 2 positions examined. Incision is well-healed. No axillary or supraclavicular adenopathy is appreciated. Cosmetic result is excellent. Well-developed well-nourished patient in NAD. HEENT reveals PERLA, EOMI, discs not visualized.  Oral cavity is clear. No oral mucosal lesions are identified. Neck is clear without evidence of cervical or supraclavicular adenopathy. Lungs are clear to A&P. Cardiac examination is essentially unremarkable with regular rate and rhythm without murmur rub or thrill. Abdomen is benign with no organomegaly or masses noted. Motor sensory and DTR levels are equal and symmetric in the upper and lower extremities. Cranial nerves II through XII are grossly intact. Proprioception is intact. No peripheral adenopathy or edema is identified. No motor or  sensory levels are noted. Crude visual fields are within normal range.  RADIOLOGY RESULTS: I have ordered bilateral diagnostic mammograms  PLAN: Present time patient is doing well 6 months out. I'm please were overall progress. She continues on Femara without side effect. I have ordered bilateral diagnostic mammograms. Patient knows to call with any concerns. I will see her back in 6 months for follow-up.  I would like to take this opportunity to thank you for allowing me to participate in the care of your patient.Armstead Peaks., MD

## 2016-07-16 ENCOUNTER — Other Ambulatory Visit: Payer: Self-pay | Admitting: *Deleted

## 2016-07-16 DIAGNOSIS — C50411 Malignant neoplasm of upper-outer quadrant of right female breast: Secondary | ICD-10-CM

## 2016-07-16 DIAGNOSIS — Z170 Estrogen receptor positive status [ER+]: Principal | ICD-10-CM

## 2016-07-23 ENCOUNTER — Ambulatory Visit: Payer: Medicare Other | Admitting: Radiation Oncology

## 2016-11-04 DIAGNOSIS — R7303 Prediabetes: Secondary | ICD-10-CM | POA: Insufficient documentation

## 2016-12-21 ENCOUNTER — Ambulatory Visit
Admission: RE | Admit: 2016-12-21 | Discharge: 2016-12-21 | Disposition: A | Discharge status: Home / Self Care | Payer: Medicare Other | Source: Ambulatory Visit | Attending: Radiation Oncology | Admitting: Radiation Oncology

## 2016-12-21 DIAGNOSIS — Z170 Estrogen receptor positive status [ER+]: Secondary | ICD-10-CM | POA: Insufficient documentation

## 2016-12-21 DIAGNOSIS — C50411 Malignant neoplasm of upper-outer quadrant of right female breast: Secondary | ICD-10-CM

## 2016-12-29 ENCOUNTER — Encounter: Payer: Self-pay | Admitting: General Surgery

## 2016-12-29 ENCOUNTER — Ambulatory Visit: Payer: Medicare Other | Admitting: General Surgery

## 2016-12-29 VITALS — BP 130/70 | HR 57 | Resp 14 | Ht 68.0 in | Wt 146.0 lb

## 2016-12-29 DIAGNOSIS — C50411 Malignant neoplasm of upper-outer quadrant of right female breast: Secondary | ICD-10-CM | POA: Diagnosis not present

## 2016-12-29 DIAGNOSIS — Z170 Estrogen receptor positive status [ER+]: Secondary | ICD-10-CM | POA: Diagnosis not present

## 2016-12-29 NOTE — Progress Notes (Signed)
[name]: Mia Perez, female   DOB: [birth year], 83 y.o.   MRN: [id number]  Chief Complaint  Patient presents with  . Follow-up    HPI Mia Perez is a 83 y.o. female   who presents for a breast evaluation. The most recent mammogram was done on 12/21/2016.  Patient does perform regular self breast checks and gets regular mammograms done.  Doing well on the Femara.                                                                                                 HPI  Past Medical History:  Diagnosis Date  . Anemia    H/O  . Arthritis   . Breast cancer ([place]) 02/10/1996   left, lumpectomy and radiation  . Cancer ([place]) 16 yrs ago   breast, left  . DVT (deep venous thrombosis) ([place]) 11/2014  . GERD (gastroesophageal reflux disease)   . Hypertension   . Lymphocytic colitis   . Personal history of radiation therapy 1998, 2017   BREAST CA  . Ulcerative colitis ([place]) 2014  . Varicose veins     Past Surgical History:  Procedure Laterality Date  . BREAST BIOPSY Left 1998   POS  . BREAST EXCISIONAL BIOPSY Right 2014   NEG  . BREAST LUMPECTOMY Right 12/30/2015   radiation  . BREAST LUMPECTOMY Left 1998   radiation  . BREAST LUMPECTOMY WITH SENTINEL LYMPH NODE BIOPSY Right 12/30/2015   8 mm, T1b, N0; RUOQ, ER 95%; PR: 80 %, Her 2 neu not overexpressed.Mammosite Radiation.  Marland Kitchen BREAST SURGERY Left 1998   lumpectomy, with radiation/ Dr Sharlet Salina  . BREAST SURGERY Right 2014   excision with wire localization/ Dr Bary Castilla  . COLONOSCOPY  2012   Dr Earlean Shawl  . DILATION AND CURETTAGE OF UTERUS    . HERNIA REPAIR    . HYSTEROSCOPY    . NECK SURGERY    . PERIPHERAL VASCULAR CATHETERIZATION N/A 12/13/2014   Procedure: IVC Filter Insertion;  Surgeon: Algernon Huxley, MD;  Location: [facility];  Service: Cardiovascular;  Laterality: N/A;  . PERIPHERAL VASCULAR CATHETERIZATION  12/13/2014   Procedure: Lower Extremity Intervention;  Surgeon: Algernon Huxley, MD;  Location: [facility];  Service: Cardiovascular;;  . PERIPHERAL VASCULAR CATHETERIZATION N/A 02/20/2015   Procedure: IVC Filter Removal;  Surgeon: Algernon Huxley, MD;  Location: [facility];  Service: Cardiovascular;  Laterality: N/A;    Family History  Problem Relation Age of Onset  . Heart disease Mother   . Hypertension Mother   . Ovarian cancer Mother 67  . Hypertension Sister   . Hypertension Brother   . Ovarian cancer Paternal Aunt 85  . Breast cancer Neg Hx     Social History Social History   Tobacco Use  . Smoking status: Never Smoker  . Smokeless tobacco: Never Used  Substance Use Topics  . Alcohol use: No  . Drug use: No    Allergies  Allergen Reactions  . Penicillins Rash    Has patient had a PCN reaction causing immediate rash,  facial/tongue/throat swelling, SOB or lightheadedness with hypotension: Yes Has patient had a PCN reaction causing severe rash involving mucus membranes or skin necrosis: Unknown Has patient had a PCN reaction that required hospitalization No Has patient had a PCN reaction occurring within the last 10 years: No If all of the above answers are "NO", then may proceed with Cephalosporin use.     Current Outpatient Medications  Medication Sig Dispense Refill  . amLODipine (NORVASC) 5 MG tablet Take 1 tablet (5 mg total) by mouth daily. 30 tablet 0  . aspirin 325 MG tablet Take 325 mg by mouth daily.    . budesonide (ENTOCORT EC) 3 MG 24 hr capsule Take 1 capsule (3 mg total) by mouth daily. (Patient taking differently: Take 3 mg by mouth every other day. ) 90 capsule 3  . Ca Carbonate-Mag Hydroxide (ROLAIDS PO) Take 1 tablet by mouth as needed.    . Cholecalciferol (VITAMIN D) 2000 units CAPS Take 2,000 Units by mouth daily.    . clidinium-chlordiazePOXIDE (LIBRAX) 5-2.5 MG capsule Take 1 capsule by mouth daily. 90 capsule 3  . Cyanocobalamin (VITAMIN B-12) 5000 MCG TBDP Take 5,000 mcg by mouth every other day.     . diphenoxylate-atropine  (LOMOTIL) 2.5-0.025 MG tablet TAKE 1 TABLET BY MOUTH 4 TIMES A DAY AS NEEDED FOR LOOSE STOOLS (Patient taking differently: Take 2-3 tablets daily as needed for loose stools) 360 tablet 0  . estradiol (ESTRACE VAGINAL) 0.1 MG/GM vaginal cream at bedtime. Apply pea sized amount to urethra nightly    . letrozole (FEMARA) 2.5 MG tablet Take 1 tablet (2.5 mg total) by mouth daily. 30 tablet 12  . loperamide (LOPERAMIDE A-D) 2 MG tablet Take 1 tablet (2 mg total) by mouth 4 (four) times daily as needed for diarrhea or loose stools. 40 tablet 0  . LUMIGAN 0.01 % SOLN Place 1 drop into both eyes at bedtime.     . metoprolol succinate (TOPROL-XL) 100 MG 24 hr tablet TAKE 1 TABLET (100 MG TOTAL) BY MOUTH DAILY. TAKE WITH OR IMMEDIATELY FOLLOWING A MEAL. 30 tablet 0  . Multiple Vitamins-Minerals (PRESERVISION AREDS 2 PO) Take 1 tablet by mouth 2 (two) times daily.     . Polyvinyl Alcohol (LUBRICANT DROPS OP) Apply 1 drop to eye daily as needed (dry eyes).    . simethicone (MYLICON) 175 MG chewable tablet Chew 125 mg by mouth every morning.     . valsartan (DIOVAN) 320 MG tablet Take 1 tablet (320 mg total) by mouth daily. 30 tablet 0   No current facility-administered medications for this visit.     Review of Systems Review of Systems  Constitutional: Negative.   Respiratory: Negative.   Cardiovascular: Negative.     Blood pressure 130/70, pulse (!) 57, resp. rate 14, height 5\' 8"  (1.727 m), weight 146 lb (66.2 kg).  Physical Exam Physical Exam  Constitutional: She is oriented to person, place, and time. She appears well-developed and well-nourished.  Eyes: Conjunctivae are normal. No scleral icterus.  Neck: Neck supple.  Cardiovascular: Normal rate, regular rhythm and normal heart sounds.  Pulmonary/Chest: Effort normal and breath sounds normal. Right breast exhibits no inverted nipple, no mass, no nipple discharge, no skin change and no tenderness. Left breast exhibits no inverted nipple, no  mass, no nipple discharge, no skin change and no tenderness.    Lymphadenopathy:    She has no cervical adenopathy.    She has no axillary adenopathy.       Right:  No supraclavicular adenopathy present.  Neurological: She is alert and oriented to person, place, and time.  Skin: Skin is warm and dry.    Data Reviewed 12/21/2016 bilateral diagnostic mammograms reviewed.Postsurgical change. BIRAD-2.  Assessment    No evidence of recurrent breast cancer.    Plan         The patient has been asked to return to the office in one year with a bilateral diagnostic mammogram.The patient is aware to call back for any questions or concerns.  HPI, Physical Exam, Assessment and Plan have been scribed under the direction and in the presence of Hervey Ard, MD.  Gaspar Cola, CMA  I have completed the exam and reviewed the above documentation for accuracy and completeness.  I agree with the above.  Haematologist has been used and any errors in dictation or transcription are unintentional.  Hervey Ard, M.D., F.A.C.S.  Robert Bellow 12/30/2016, 8:25 PM

## 2016-12-29 NOTE — Patient Instructions (Addendum)
The patient has been asked to return to the office in one year with a bilateral diagnostic mammogram.The patient is aware to call back for any questions or concerns. 

## 2016-12-30 ENCOUNTER — Encounter: Payer: Self-pay | Admitting: General Surgery

## 2017-01-25 ENCOUNTER — Encounter: Payer: Self-pay | Admitting: Radiation Oncology

## 2017-01-25 ENCOUNTER — Ambulatory Visit
Admission: RE | Admit: 2017-01-25 | Discharge: 2017-01-25 | Disposition: A | Discharge status: Home / Self Care | Payer: Medicare Other | Source: Ambulatory Visit | Attending: Radiation Oncology | Admitting: Radiation Oncology

## 2017-01-25 ENCOUNTER — Other Ambulatory Visit: Payer: Self-pay

## 2017-01-25 VITALS — BP 139/82 | HR 65 | Temp 96.5°F | Resp 20 | Wt 144.1 lb

## 2017-01-25 DIAGNOSIS — Z79811 Long term (current) use of aromatase inhibitors: Secondary | ICD-10-CM | POA: Diagnosis not present

## 2017-01-25 DIAGNOSIS — Z170 Estrogen receptor positive status [ER+]: Secondary | ICD-10-CM | POA: Diagnosis not present

## 2017-01-25 DIAGNOSIS — Z923 Personal history of irradiation: Secondary | ICD-10-CM | POA: Insufficient documentation

## 2017-01-25 DIAGNOSIS — C50411 Malignant neoplasm of upper-outer quadrant of right female breast: Secondary | ICD-10-CM | POA: Diagnosis not present

## 2017-01-25 NOTE — Progress Notes (Signed)
Radiation Oncology Follow up Note  Name: Mia Perez   Date:   01/25/2017 MRN:  [id number] DOB: 1933-12-20    This 83 y.o. female presents to the clinic today for 1 year follow-up status post accelerated partial breast radiation to her right breast for stage I invasive mammary carcinoma.  REFERRING PROVIDER: Coral Spikes, DO  HPI: Patient is a 83 year old female now seen out 1 year having completed accelerated partial breast radiation to her right breast for stage I invasive mammary carcinoma. She is seen today in routine follow-up and is doing well. She specifically denies breast tenderness cough or bone pain.. She had mammograms back in November which I have reviewed were BI-RADS 2 benign. She is currently on Femara tolerate that well without side effect.  COMPLICATIONS OF TREATMENT: none  FOLLOW UP COMPLIANCE: keeps appointments   PHYSICAL EXAM:  BP 139/82   Pulse 65   Temp (!) 96.5 F (35.8 C)   Resp 20   Wt 144 lb 1.1 oz (65.4 kg)   BMI 21.91 kg/m  Lungs are clear to A&P cardiac examination essentially unremarkable with regular rate and rhythm. No dominant mass or nodularity is noted in either breast in 2 positions examined. Incision is well-healed. No axillary or supraclavicular adenopathy is appreciated. Cosmetic result is excellent. Still some slight thickness in the lumpectomy site which we would expect from high dose rate remote afterloading. Well-developed well-nourished patient in NAD. HEENT reveals PERLA, EOMI, discs not visualized.  Oral cavity is clear. No oral mucosal lesions are identified. Neck is clear without evidence of cervical or supraclavicular adenopathy. Lungs are clear to A&P. Cardiac examination is essentially unremarkable with regular rate and rhythm without murmur rub or thrill. Abdomen is benign with no organomegaly or masses noted. Motor sensory and DTR levels are equal and symmetric in the upper and lower extremities. Cranial nerves II through XII  are grossly intact. Proprioception is intact. No peripheral adenopathy or edema is identified. No motor or sensory levels are noted. Crude visual fields are within normal range.  RADIOLOGY RESULTS: Mammograms are reviewed and compatible with the above-stated findings  PLAN: At the present time she is doing well with no evidence of disease 1 year out. I've asked to see her back in 1 year for follow-up. She continues on Femara without side effect. She or he has follow-up mammograms ordered. Patient is to call with any concerns.  I would like to take this opportunity to thank you for allowing me to participate in the care of your patient.Noreene Filbert, MD

## 2017-01-29 ENCOUNTER — Ambulatory Visit: Payer: Medicare Other

## 2017-06-01 ENCOUNTER — Other Ambulatory Visit: Payer: Self-pay | Admitting: Family Medicine

## 2017-11-05 ENCOUNTER — Other Ambulatory Visit: Payer: Self-pay

## 2017-11-05 DIAGNOSIS — C50411 Malignant neoplasm of upper-outer quadrant of right female breast: Secondary | ICD-10-CM

## 2017-11-05 DIAGNOSIS — Z170 Estrogen receptor positive status [ER+]: Principal | ICD-10-CM

## 2017-11-05 IMAGING — MG 2D DIGITAL DIAGNOSTIC UNILATERAL RIGHT MAMMOGRAM WITH CAD AND AD
6 series · 6 of 14 positions shown · non-contrast
Comparison: 11/28/2015 and earlier

CLINICAL DATA: The patient returns after screening study for
evaluation of possible right breast mass. History of left lumpectomy
in 4338 treated with radiation therapy.

EXAM:
2D DIGITAL DIAGNOSTIC RIGHT MAMMOGRAM WITH CAD AND ADJUNCT TOMO
ULTRASOUND RIGHT BREAST

[R MLO synth-2D]
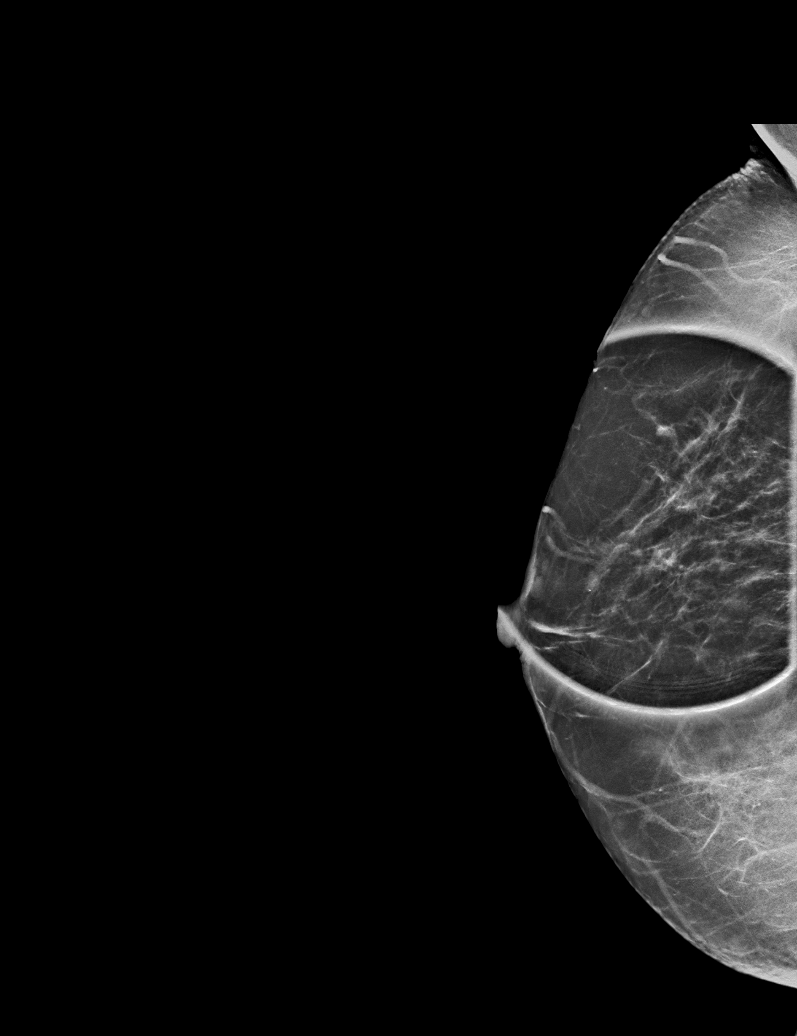

[R CC]
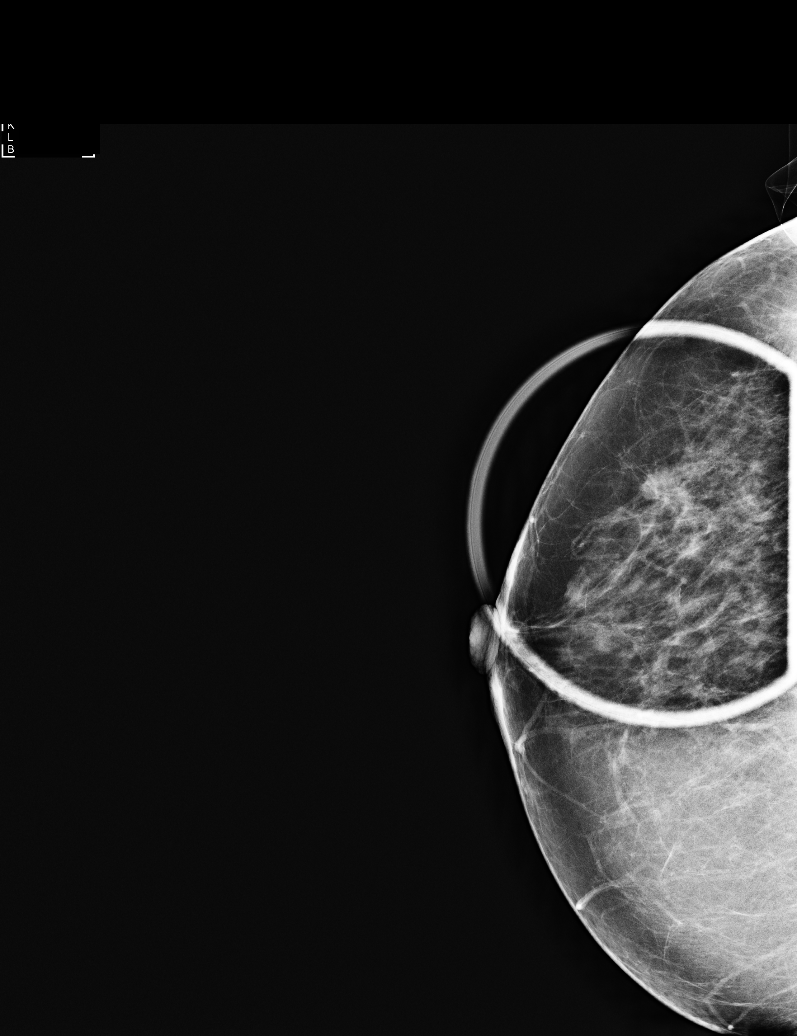

[R MLO]
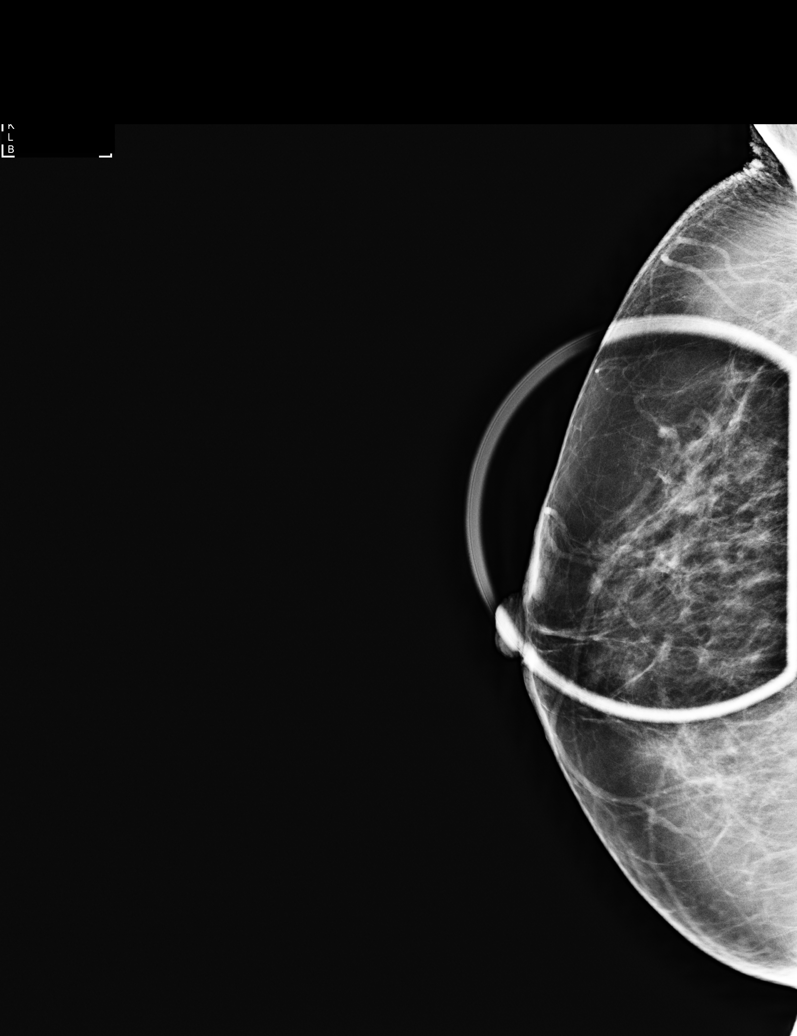

[R CC synth-2D]
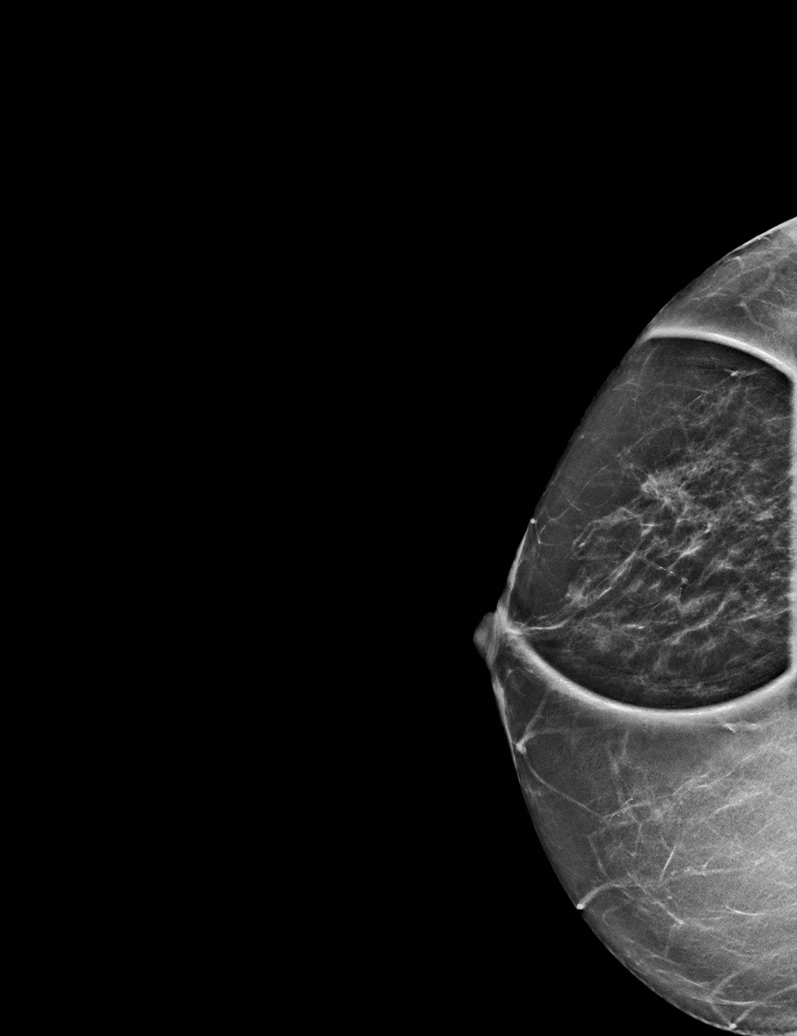

[R MLO tomo · tomo slice 23/46.0]
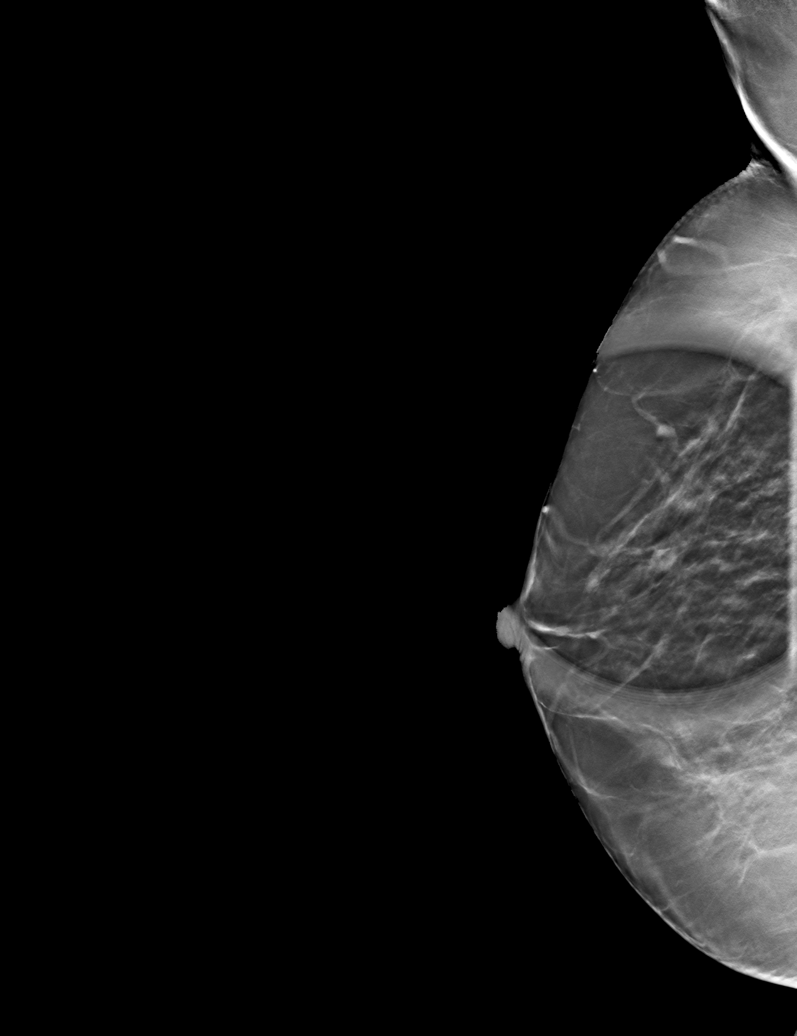

[R CC tomo · tomo slice 23/44.0]
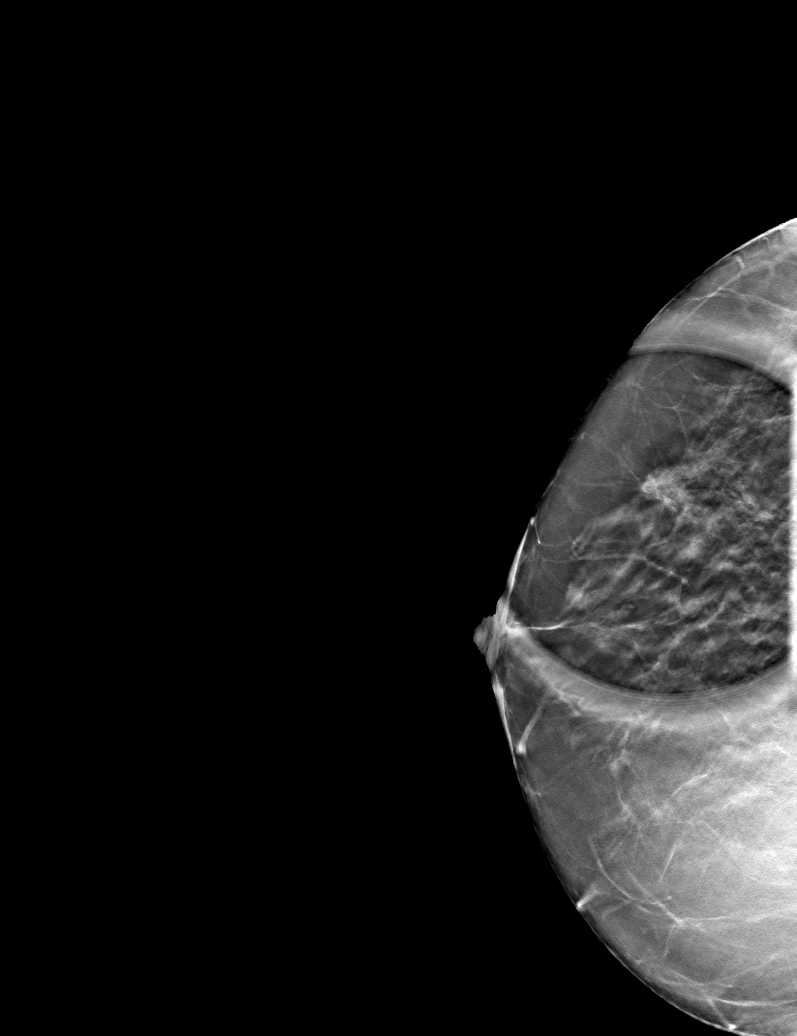

[6 of 14 positions shown; findings below may reference images not displayed]

ACR Breast Density Category b: There are scattered areas of
fibroglandular density.
FINDINGS: Additional views are performed, confirming presence of a spiculated
mass in the lateral anterior aspect of the right breast, best seen
on craniocaudal view.

Mammographic images were processed with CAD.

On physical exam, I palpate no abnormality in the lateral aspect of
the right breast.

Targeted ultrasound is performed, showing an irregular hypoechoic
mass in the 10 o'clock location of the right breast 4 cm from the
nipple. There is associated acoustic shadowing. Mass measures 1.0 x
0.7 x 0.5 cm. Adjacent vascularity identified by Doppler evaluation.
Evaluation of the axilla shows no enlarged or suspicious lymph
nodes.
IMPRESSION: Persistent mass in the 10 o'clock location of the right breast 4 cm
from the nipple suspicious for malignancy.

RECOMMENDATION:
Ultrasound-guided core biopsy is recommended.

I have discussed the findings and recommendations with the patient.
Results were also provided in writing at the conclusion of the
visit. If applicable, a reminder letter will be sent to the patient
regarding the next appointment.

BI-RADS CATEGORY  4: Suspicious.

## 2017-11-05 NOTE — Progress Notes (Signed)
dia

## 2017-12-22 ENCOUNTER — Ambulatory Visit
Admission: RE | Admit: 2017-12-22 | Discharge: 2017-12-22 | Disposition: A | Discharge status: Home / Self Care | Payer: Medicare Other | Source: Ambulatory Visit | Attending: General Surgery | Admitting: General Surgery

## 2017-12-22 DIAGNOSIS — Z170 Estrogen receptor positive status [ER+]: Principal | ICD-10-CM

## 2017-12-22 DIAGNOSIS — C50411 Malignant neoplasm of upper-outer quadrant of right female breast: Secondary | ICD-10-CM

## 2017-12-30 ENCOUNTER — Ambulatory Visit: Payer: Medicare Other | Admitting: General Surgery

## 2017-12-30 ENCOUNTER — Encounter: Payer: Self-pay | Admitting: General Surgery

## 2017-12-30 VITALS — BP 155/77 | HR 69 | Temp 97.2°F | Resp 18 | Ht 68.0 in | Wt 147.2 lb

## 2017-12-30 DIAGNOSIS — C50411 Malignant neoplasm of upper-outer quadrant of right female breast: Secondary | ICD-10-CM | POA: Diagnosis not present

## 2017-12-30 DIAGNOSIS — Z170 Estrogen receptor positive status [ER+]: Secondary | ICD-10-CM | POA: Diagnosis not present

## 2017-12-30 NOTE — Patient Instructions (Addendum)
Patient is to return to the office in 1 year with bilateral diagnostic mammogram with Dr.Byrnett. She will need to have a bone density in February 2020. Call the doctors office for any refills that may be needed.  Call the office with any questions or concerns.

## 2017-12-30 NOTE — Progress Notes (Signed)
[name]: OLA RAAP, female   DOB: Dec 14, 1933, 84 y.o.   MRN: [id number]  Chief Complaint  Patient presents with  . Follow-up    1 yr f/u recall bil diag mammo [facility] 12/22/17,    HPI Mia Perez is a 84 y.o. female here today for 1 year follow up for bilateral diagnostic mammogram. Patients states she is doing well.  HPI  Past Medical History:  Diagnosis Date  . Anemia    H/O  . Arthritis   . Breast cancer ([place]) 02/10/1996   left, lumpectomy and radiation  . DVT (deep venous thrombosis) ([place]) 11/2014  . GERD (gastroesophageal reflux disease)   . Hypertension   . Lymphocytic colitis   . Personal history of radiation therapy 1998, 2017   BREAST CA  . Ulcerative colitis ([place]) 2014  . Varicose veins     Past Surgical History:  Procedure Laterality Date  . BREAST EXCISIONAL BIOPSY Right 2014   NEG  . BREAST LUMPECTOMY Left 1998   radiation  . BREAST LUMPECTOMY WITH SENTINEL LYMPH NODE BIOPSY Right 12/30/2015   8 mm, T1b, N0; RUOQ, ER 95%; PR: 80 %, Her 2 neu not overexpressed.Mammosite Radiation.  Marland Kitchen BREAST SURGERY Right 2014   Microcalcifications excised with wire localization/ Dr Bary Castilla  . COLONOSCOPY  2012   Dr Earlean Shawl  . DILATION AND CURETTAGE OF UTERUS    . HERNIA REPAIR    . HYSTEROSCOPY    . NECK SURGERY    . PERIPHERAL VASCULAR CATHETERIZATION N/A 12/13/2014   Procedure: IVC Filter Insertion;  Surgeon: Algernon Huxley, MD;  Location: [place] [facility];  Service: Cardiovascular;  Laterality: N/A;  . PERIPHERAL VASCULAR CATHETERIZATION  12/13/2014   Procedure: Lower Extremity Intervention;  Surgeon: Algernon Huxley, MD;  Location: [place] [facility];  Service: Cardiovascular;;  . PERIPHERAL VASCULAR CATHETERIZATION N/A 02/20/2015   Procedure: IVC Filter Removal;  Surgeon: Algernon Huxley, MD;  Location: [place] [facility];  Service: Cardiovascular;  Laterality: N/A;    Family History  Problem Relation Age of Onset  . Heart disease Mother   . Hypertension  Mother   . Ovarian cancer Mother 20  . Hypertension Sister   . Hypertension Brother   . Ovarian cancer Paternal Aunt 85  . Breast cancer Neg Hx     Social History Social History   Tobacco Use  . Smoking status: Never Smoker  . Smokeless tobacco: Never Used  Substance Use Topics  . Alcohol use: No  . Drug use: No    Allergies  Allergen Reactions  . Penicillins Rash    Has patient had a PCN reaction causing immediate rash, facial/tongue/throat swelling, SOB or lightheadedness with hypotension: Yes Has patient had a PCN reaction causing severe rash involving mucus membranes or skin necrosis: Unknown Has patient had a PCN reaction that required hospitalization No Has patient had a PCN reaction occurring within the last 10 years: No If all of the above answers are "NO", then may proceed with Cephalosporin use.     Current Outpatient Medications  Medication Sig Dispense Refill  . amLODipine (NORVASC) 5 MG tablet Take 1 tablet (5 mg total) by mouth daily. (Patient taking differently: Take 7.5 mg by mouth daily. ) 30 tablet 0  . aspirin 325 MG tablet Take 325 mg by mouth daily.    . budesonide (ENTOCORT EC) 3 MG 24 hr capsule Take 1 capsule (3 mg total) by mouth daily. (Patient taking differently: Take 3 mg by  mouth every other day. ) 90 capsule 3  . Ca Carbonate-Mag Hydroxide (ROLAIDS PO) Take 1 tablet by mouth as needed.    . Calcium Carb-Cholecalciferol (CALCIUM-VITAMIN D) 500-200 MG-UNIT tablet Take 1 tablet by mouth daily.    . Cholecalciferol (VITAMIN D) 2000 units CAPS Take 2,000 Units by mouth daily.    . clidinium-chlordiazePOXIDE (LIBRAX) 5-2.5 MG capsule Take 1 capsule by mouth daily. 90 capsule 3  . Cyanocobalamin (VITAMIN B-12) 5000 MCG TBDP Take 5,000 mcg by mouth every other day.     . diphenoxylate-atropine (LOMOTIL) 2.5-0.025 MG tablet TAKE 1 TABLET BY MOUTH 4 TIMES A DAY AS NEEDED FOR LOOSE STOOLS (Patient taking differently: Take 2-3 tablets daily as needed for  loose stools) 360 tablet 0  . estradiol (ESTRACE VAGINAL) 0.1 MG/GM vaginal cream at bedtime. Apply pea sized amount to urethra nightly    . letrozole (FEMARA) 2.5 MG tablet Take 1 tablet (2.5 mg total) by mouth daily. 30 tablet 12  . loperamide (LOPERAMIDE A-D) 2 MG tablet Take 1 tablet (2 mg total) by mouth 4 (four) times daily as needed for diarrhea or loose stools. 40 tablet 0  . losartan (COZAAR) 50 MG tablet Take 50 mg by mouth daily.    Marland Kitchen LUMIGAN 0.01 % SOLN Place 1 drop into both eyes at bedtime.     . metoprolol succinate (TOPROL-XL) 100 MG 24 hr tablet TAKE 1 TABLET (100 MG TOTAL) BY MOUTH DAILY. TAKE WITH OR IMMEDIATELY FOLLOWING A MEAL. 30 tablet 0  . Multiple Vitamins-Minerals (PRESERVISION AREDS 2 PO) Take 1 tablet by mouth 2 (two) times daily.     Marland Kitchen neomycin-polymyxin-hydrocortisone (CORTISPORIN) 3.5-10000-1 OTIC suspension Place in ear(s).    . neomycin-polymyxin-hydrocortisone (CORTISPORIN) 3.5-10000-1 OTIC suspension     . Polyvinyl Alcohol (LUBRICANT DROPS OP) Apply 1 drop to eye daily as needed (dry eyes).    . simethicone (MYLICON) 387 MG chewable tablet Chew 125 mg by mouth every morning.     . valsartan (DIOVAN) 320 MG tablet Take 1 tablet (320 mg total) by mouth daily. 30 tablet 0  . VITAMINS C E PO Take by mouth.     No current facility-administered medications for this visit.     Review of Systems Review of Systems  Constitutional: Negative.   Respiratory: Negative.   Cardiovascular: Negative.     Blood pressure (!) 155/77, pulse 69, temperature (!) 97.2 F (36.2 C), temperature source Temporal, resp. rate 18, height 5\' 8"  (1.727 m), weight 147 lb 3.2 oz (66.8 kg), SpO2 96 %.  Physical Exam Physical Exam  Constitutional: She is oriented to person, place, and time. She appears well-developed and well-nourished.  Cardiovascular: Normal rate, regular rhythm and normal heart sounds.  Pulmonary/Chest: Effort normal and breath sounds normal. Right breast exhibits  no inverted nipple, no mass, no nipple discharge, no skin change and no tenderness. Left breast exhibits no inverted nipple, no mass, no nipple discharge, no skin change and no tenderness. Breasts are symmetrical.    Lymphadenopathy:    She has no cervical adenopathy.    She has no axillary adenopathy.       Right: No supraclavicular adenopathy present.       Left: No supraclavicular adenopathy present.  Neurological: She is alert and oriented to person, place, and time.  Skin: Skin is warm and dry.    Data Reviewed Bilateral diagnostic mammograms dated December 22, 2017 were reviewed.  Postsurgical changes.  BI-RADS-2.  Bone density testing dated February 2018 showed developing  osteopenia.  Assessment    Doing well without evidence of recurrent disease now 2 years out for management of her right breast cancer.  Good tolerance of letrozole.    Plan Importance of maintaining adequate calcium intake with vitamin D reviewed.  Bone density in February 2020.  Continue letrozole for 5 years.  Patient is to return to the office in 1 year with bilateral diagnostic mammogram with Dr.Iker Nuttall. S  HPI, Physical Exam, Assessment and Plan have been scribed under the direction and in the presence of Hervey Ard, Md.  Eudelia Bunch R. Bobette Mo, CMA   I have completed the exam and reviewed the above documentation for accuracy and completeness.  I agree with the above.  Haematologist has been used and any errors in dictation or transcription are unintentional.  Hervey Ard, M.D., F.A.C.S.  Forest Gleason Skeeter Sheard 12/30/2017, 1:47 PM

## 2018-01-17 ENCOUNTER — Other Ambulatory Visit: Payer: Self-pay

## 2018-01-17 ENCOUNTER — Encounter: Payer: Self-pay | Admitting: Radiation Oncology

## 2018-01-17 ENCOUNTER — Ambulatory Visit
Admission: RE | Admit: 2018-01-17 | Discharge: 2018-01-17 | Disposition: A | Discharge status: Home / Self Care | Payer: Medicare Other | Source: Ambulatory Visit | Attending: Radiation Oncology | Admitting: Radiation Oncology

## 2018-01-17 VITALS — BP 157/78 | HR 63 | Temp 96.2°F | Resp 16 | Wt 151.1 lb

## 2018-01-17 DIAGNOSIS — Z79811 Long term (current) use of aromatase inhibitors: Secondary | ICD-10-CM | POA: Diagnosis not present

## 2018-01-17 DIAGNOSIS — Z923 Personal history of irradiation: Secondary | ICD-10-CM | POA: Insufficient documentation

## 2018-01-17 DIAGNOSIS — C50411 Malignant neoplasm of upper-outer quadrant of right female breast: Secondary | ICD-10-CM | POA: Diagnosis present

## 2018-01-17 DIAGNOSIS — Z170 Estrogen receptor positive status [ER+]: Secondary | ICD-10-CM | POA: Diagnosis not present

## 2018-01-17 NOTE — Progress Notes (Signed)
Radiation Oncology Follow up Note  Name: Mia Perez   Date:   01/17/2018 MRN:  [id number] DOB: Jun 25, 1933    This 84 y.o. female presents to the clinic today for to year follow-up status post 6 accelerated partial breast radiation to her right breast for stage I invasive mammary carcinoma.  REFERRING PROVIDER: Glendon Axe, MD  HPI: patient is a 84 year old female now seen out 2 years having completed accelerated partial breast irradiation to her right breast for stage I ER/PR positive invasive mammary carcinoma. Seen today in routine follow-up she is doing well. She specifically denies breast tenderness cough or bone pain. She is currently on Femaratolerating that well without side effect..she mammograms last month by which I have reviewed will BI-RADS 2 benign.  COMPLICATIONS OF TREATMENT: none  FOLLOW UP COMPLIANCE: keeps appointments   PHYSICAL EXAM:  BP (!) 157/78 (BP Location: Left Arm, Patient Position: Sitting)   Pulse 63   Temp (!) 96.2 F (35.7 C) (Tympanic)   Resp 16   Wt 151 lb 2 oz (68.6 kg)   BMI 22.98 kg/m  Lungs are clear to A&P cardiac examination essentially unremarkable with regular rate and rhythm. No dominant mass or nodularity is noted in either breast in 2 positions examined. Incision is well-healed. No axillary or supraclavicular adenopathy is appreciated. Cosmetic result is excellent.Well-developed well-nourished patient in NAD. HEENT reveals PERLA, EOMI, discs not visualized.  Oral cavity is clear. No oral mucosal lesions are identified. Neck is clear without evidence of cervical or supraclavicular adenopathy. Lungs are clear to A&P. Cardiac examination is essentially unremarkable with regular rate and rhythm without murmur rub or thrill. Abdomen is benign with no organomegaly or masses noted. Motor sensory and DTR levels are equal and symmetric in the upper and lower extremities. Cranial nerves II through XII are grossly intact. Proprioception is intact.  No peripheral adenopathy or edema is identified. No motor or sensory levels are noted. Crude visual fields are within normal range.  RADIOLOGY RESULTS: mammograms are reviewed and compatible above-stated findings  PLAN: present time she is now out 2 years with no evidence of disease and is doing well. I am please were overall progress. I've asked to see her back in 1 year for follow-up. Patient continues on Femara without side effect.patient is to call anytime with any concerns.  I would like to take this opportunity to thank you for allowing me to participate in the care of your patient.Noreene Filbert, MD

## 2018-01-24 ENCOUNTER — Other Ambulatory Visit: Payer: Self-pay | Admitting: General Surgery

## 2018-03-15 ENCOUNTER — Other Ambulatory Visit: Payer: Self-pay

## 2018-03-15 DIAGNOSIS — Z780 Asymptomatic menopausal state: Secondary | ICD-10-CM

## 2018-03-15 DIAGNOSIS — C50411 Malignant neoplasm of upper-outer quadrant of right female breast: Secondary | ICD-10-CM

## 2018-03-15 DIAGNOSIS — Z170 Estrogen receptor positive status [ER+]: Principal | ICD-10-CM

## 2018-04-19 ENCOUNTER — Other Ambulatory Visit: Payer: Medicare Other

## 2018-05-04 ENCOUNTER — Other Ambulatory Visit: Payer: Medicare Other

## 2018-07-15 ENCOUNTER — Encounter: Payer: Medicare Other | Admitting: Family

## 2018-07-18 ENCOUNTER — Ambulatory Visit (INDEPENDENT_AMBULATORY_CARE_PROVIDER_SITE_OTHER): Payer: Medicare Other | Admitting: Family

## 2018-07-18 ENCOUNTER — Other Ambulatory Visit: Payer: Self-pay

## 2018-07-18 ENCOUNTER — Encounter: Payer: Self-pay | Admitting: Family

## 2018-07-18 DIAGNOSIS — Z853 Personal history of malignant neoplasm of breast: Secondary | ICD-10-CM

## 2018-07-18 DIAGNOSIS — K52832 Lymphocytic colitis: Secondary | ICD-10-CM | POA: Diagnosis not present

## 2018-07-18 DIAGNOSIS — I10 Essential (primary) hypertension: Secondary | ICD-10-CM | POA: Diagnosis not present

## 2018-07-18 MED ORDER — METOPROLOL SUCCINATE ER 100 MG PO TB24: ORAL_TABLET | ORAL | 0 refills | Status: DC

## 2018-07-18 MED ORDER — AMLODIPINE BESYLATE 5 MG PO TABS: 5.0000 mg | ORAL_TABLET | Freq: Every day | ORAL | 0 refills | Status: DC

## 2018-07-18 MED ORDER — LOSARTAN POTASSIUM 100 MG PO TABS: 100.0000 mg | ORAL_TABLET | Freq: Every evening | ORAL | 1 refills | Status: DC

## 2018-07-18 MED ORDER — AMLODIPINE BESYLATE 2.5 MG PO TABS: 2.5000 mg | ORAL_TABLET | Freq: Every day | ORAL | 3 refills | Status: DC

## 2018-07-18 NOTE — Assessment & Plan Note (Signed)
BP Readings from Last 3 Encounters:  01/17/18 (!) 157/78  12/30/17 (!) 155/77  01/25/17 139/82   Reasonably controlled, goal < 140/90. Start taking losartan qhs to see if brings down slightly. She will send me BP readings from  home.

## 2018-07-18 NOTE — Assessment & Plan Note (Signed)
Improved. Follows with GI, Dr Earlean Shawl.

## 2018-07-18 NOTE — Patient Instructions (Addendum)
Nice to meet you!  Please schedule fasting  Labs.  Please take losartan in the evening.   I would like blood pressure, heart rate log. Please call with readings.   Monitor blood pressure,  Goal is less than 140/80 ; if persistently higher, please make sooner follow up appointment so we can recheck you blood pressure and manage medications.   Follow up in 3-6 months.

## 2018-07-18 NOTE — Progress Notes (Signed)
Verbal consent for services obtained from patient prior to services given.  Location of call:  provider at work patient at home  Names of all persons present for services: Mia Paris, NP Chief complaint:   Establish care  Had been following with Dr [name] at [facility].   Feels well. No new complaints.   Had been going to [facility]  2 days per week prior to pandemic. Goes up and down steps daily at home. Does twisting exercise and dumbbells which she uses daily.  Lives home alone, widow.   HTN- notes the last 6 months, feels like would increase 'after supper'. 153/74, HR 69 couple of weeks ago late in the evening. Takes blood pressure medication toprol 100mg  qam Losartan 100mg  qam Amlodipine 5mg  qam amlodpine 2.5mg  qam ( added by Dr [name]); will take another 2.5mg  in the evening if blood pressure is high.   Denies exertional chest pain or pressure, numbness or tingling radiating to left arm or jaw, palpitations, dizziness, frequent headaches, changes in vision, or shortness of breath.   Lymphocytic colitis- Diarrhea type. 'much much better'. On budesonide daily. Take lomotil if needed. Follows with Dr Earlean Shawl, GI.  No longer doing colonoscopy.   History, background, results pertinent:  Follows with Dr Baruch Gouty for left breast cancer Continue to follow with Dr [name] for mammogram/ bone density.  On femara.   Non smoker  A/P/next steps:  . Problem List Items Addressed This Visit      Cardiovascular and Mediastinum   Hypertension - Primary    BP Readings from Last 3 Encounters:  01/17/18 (!) 157/78  12/30/17 (!) 155/77  01/25/17 139/82   Reasonably controlled, goal < 140/90. Start taking losartan qhs to see if brings down slightly. She will send me BP readings from  home.       Relevant Medications   losartan (COZAAR) 100 MG tablet   amLODipine (NORVASC) 5 MG tablet   amLODipine (NORVASC) 2.5 MG tablet   metoprolol succinate (TOPROL-XL) 100 MG 24 hr tablet   Other  Relevant Orders   Comprehensive metabolic panel   CBC with Differential/Platelet   Lipid panel   Hemoglobin A1c   VITAMIN D 25 Hydroxy (Vit-D Deficiency, Fractures)     Digestive   Lymphocytic colitis    Improved. Follows with GI, Dr Earlean Shawl.         Other   History of left breast cancer    On femara. Follows with Dr Baruch Gouty, Dr [name] ( for mammograms).         I spent 25 min  discussing plan of care over the phone.

## 2018-07-18 NOTE — Assessment & Plan Note (Signed)
On femara. Follows with Dr Baruch Gouty, Dr [name] ( for mammograms).

## 2018-07-19 NOTE — Progress Notes (Signed)
Mailed AVS to pt's home.  Nina,cma

## 2018-08-01 ENCOUNTER — Other Ambulatory Visit: Payer: Self-pay

## 2018-08-01 ENCOUNTER — Other Ambulatory Visit (INDEPENDENT_AMBULATORY_CARE_PROVIDER_SITE_OTHER): Payer: Medicare Other

## 2018-08-01 DIAGNOSIS — I10 Essential (primary) hypertension: Secondary | ICD-10-CM | POA: Diagnosis not present

## 2018-08-01 LAB — CBC WITH DIFFERENTIAL/PLATELET
Basophils Absolute: 0.1 10*3/uL (ref 0.0–0.1)
Basophils Relative: 0.8 % (ref 0.0–3.0)
Eosinophils Absolute: 0.3 10*3/uL (ref 0.0–0.7)
Eosinophils Relative: 2.9 % (ref 0.0–5.0)
HCT: 40.3 % (ref 36.0–46.0)
Hemoglobin: 13.2 g/dL (ref 12.0–15.0)
Lymphocytes Relative: 19.1 % (ref 12.0–46.0)
Lymphs Abs: 1.8 10*3/uL (ref 0.7–4.0)
MCHC: 32.8 g/dL (ref 30.0–36.0)
MCV: 92.8 fl (ref 78.0–100.0)
Monocytes Absolute: 0.7 10*3/uL (ref 0.1–1.0)
Monocytes Relative: 6.9 % (ref 3.0–12.0)
Neutro Abs: 6.8 10*3/uL (ref 1.4–7.7)
Neutrophils Relative %: 70.3 % (ref 43.0–77.0)
Platelets: 317 10*3/uL (ref 150.0–400.0)
RBC: 4.34 Mil/uL (ref 3.87–5.11)
RDW: 15.1 % (ref 11.5–15.5)
WBC: 9.7 10*3/uL (ref 4.0–10.5)

## 2018-08-01 LAB — HEMOGLOBIN A1C: Hgb A1c MFr Bld: 6.2 % (ref 4.6–6.5)

## 2018-08-01 LAB — COMPREHENSIVE METABOLIC PANEL
ALT: 13 U/L (ref 0–35)
AST: 13 U/L (ref 0–37)
Albumin: 4 g/dL (ref 3.5–5.2)
Alkaline Phosphatase: 73 U/L (ref 39–117)
BUN: 34 mg/dL — ABNORMAL HIGH (ref 6–23)
CO2: 24 mEq/L (ref 19–32)
Calcium: 9.6 mg/dL (ref 8.4–10.5)
Chloride: 106 mEq/L (ref 96–112)
Creatinine, Ser: 1.29 mg/dL — ABNORMAL HIGH (ref 0.40–1.20)
GFR: 39.52 mL/min — ABNORMAL LOW (ref >60.00)
Glucose, Bld: 90 mg/dL (ref 70–99)
Potassium: 4.3 mEq/L (ref 3.5–5.1)
Sodium: 140 mEq/L (ref 135–145)
Total Bilirubin: 0.4 mg/dL (ref 0.2–1.2)
Total Protein: 6.5 g/dL (ref 6.0–8.3)

## 2018-08-01 LAB — LIPID PANEL
Cholesterol: 210 mg/dL — ABNORMAL HIGH (ref 0–200)
HDL: 46.9 mg/dL (ref >39.00)
LDL Cholesterol: 124 mg/dL — ABNORMAL HIGH (ref 0–99)
NonHDL: 162.64
Total CHOL/HDL Ratio: 4
Triglycerides: 194 mg/dL — ABNORMAL HIGH (ref 0.0–149.0)
VLDL: 38.8 mg/dL (ref 0.0–40.0)

## 2018-08-01 LAB — VITAMIN D 25 HYDROXY (VIT D DEFICIENCY, FRACTURES): VITD: 52.29 ng/mL (ref 30.00–100.00)

## 2018-08-08 ENCOUNTER — Other Ambulatory Visit: Payer: Self-pay | Admitting: Family

## 2018-08-08 ENCOUNTER — Encounter: Payer: Self-pay | Admitting: Family

## 2018-08-08 DIAGNOSIS — N189 Chronic kidney disease, unspecified: Secondary | ICD-10-CM

## 2018-08-08 DIAGNOSIS — E785 Hyperlipidemia, unspecified: Secondary | ICD-10-CM

## 2018-08-08 MED ORDER — PRAVASTATIN SODIUM 10 MG PO TABS: 10.0000 mg | ORAL_TABLET | Freq: Every day | ORAL | 1 refills | Status: DC

## 2018-08-17 ENCOUNTER — Telehealth: Payer: Self-pay

## 2018-08-17 NOTE — Telephone Encounter (Signed)
Copied from [place] [phone]. Topic: General - Other >> Aug 17, 2018 12:54 PM Burchel, Abbi R wrote: Reason for CRM: Pt would like to have a hard copy of her most recent labs mailed to her home address.

## 2018-08-17 NOTE — Telephone Encounter (Signed)
I have printed & mailed.

## 2018-08-18 ENCOUNTER — Telehealth: Payer: Self-pay | Admitting: Family

## 2018-08-18 NOTE — Telephone Encounter (Signed)
Pt came in and dropped off readings. It's in color folder up front. Thank you!

## 2018-08-22 ENCOUNTER — Telehealth: Payer: Self-pay | Admitting: Family

## 2018-08-22 NOTE — Telephone Encounter (Signed)
Patient was informed.  Patient understood and no questions, comments, or concerns at this time.  

## 2018-08-22 NOTE — Telephone Encounter (Signed)
Call patient  I received her blood pressure log.  I presume that she is taking her losartan at bedtime at this point. Blood pressure range 137/65, 148/65 134/66, 132/67 141/77, 156/82 135/70, 129/68 132/68 155/76 141/69 145/74  I would advise a reasonable goal of around 130/80 is appropriate for patient which she appears to be close too.   Her heart rate runs on the lower side as well as diastolic BP so I am hesitant to increase her blood pressure regimen.    I would advise that she takes additional dose of the 2.5 mg amlodipine if elevated.  Presuming that she continues to do this.  Advise LOW salt diet well.

## 2018-08-31 ENCOUNTER — Encounter: Payer: Self-pay | Admitting: General Surgery

## 2018-09-26 ENCOUNTER — Telehealth: Payer: Self-pay | Admitting: Family

## 2018-09-26 DIAGNOSIS — Z853 Personal history of malignant neoplasm of breast: Secondary | ICD-10-CM

## 2018-09-26 NOTE — Telephone Encounter (Signed)
Call patient I received your blood pressure reading 1 rate.  She does have some values that are greater than 140 SBP.   Please confirm that she is taking the Norvasc 7.5 mg.    I would advise her to use additional norvasc 2.5 mg dose given by Dr. [name] to be taken when blood pressures are greater than 826 systolically.  Her lower number (diastolic) appears to be very well controlled however.   Did she ever start taking losartan at bedtime and notice a difference?

## 2018-09-28 ENCOUNTER — Other Ambulatory Visit: Payer: Self-pay | Admitting: Nephrology

## 2018-09-28 DIAGNOSIS — N183 Chronic kidney disease, stage 3 unspecified: Secondary | ICD-10-CM

## 2018-09-28 NOTE — Telephone Encounter (Signed)
Called and spoke to patient.  Patient aware and given instructions for Norvasc per note from Mable Paris, [place].  Patient voiced understanding.    Patient said that she tried taking the Losartan at bed time but had a bad experience.  Patient said she got really dizzy.  Patient had concerns with taking Losartan at bedtime since she lives alone and is concerned about falling if she is dizzy.  Patient said she is on 325 mg of aspirin and says she would bleed easily.  Patient said that she takes losartan and metoprolol in the morning and amlodipine one hour later.  Patient said if her bp is over 155 that she takes an add'tl 2.5 mg of the amlodipine.  Patient said that she feels her bp has improved and is not as high as it has been in the past.   Patient said that her breast surgeon who usually orders her mammogram is no longer with the practice.  Patient requesting for an order be sent for her yearly mammogram to [facility].  Patient said her last mammogram was on 12/22/17.  Patient said her appt will need to be after this date but would like to schedule before her appt with Dr. Baruch Gouty on 01/23/19.

## 2018-09-28 NOTE — Telephone Encounter (Signed)
Noted  Reasonable to take 2.5mg  if BP > 155.   Advise pt that I ordered bilateral DIAGNOSTIC mammogram as report from last year 12/2017 advised her to have.   Yes, she can schedule.

## 2018-09-30 ENCOUNTER — Telehealth: Payer: Self-pay | Admitting: Family

## 2018-09-30 NOTE — Telephone Encounter (Signed)
Patient notified and voiced understanding.

## 2018-09-30 NOTE — Telephone Encounter (Signed)
Call patient advise her that I did receive labs from Dr. Keturah Barre office.  NO abnormalities that need to be addressed in primary care   however she does need to continue following with Dr. [name].

## 2018-10-03 NOTE — Telephone Encounter (Signed)
I have notified patient of below & she stated that she [place] of kidneys on Friday. She also has 6 month f/u with Dr. [name]. I have made her an appointment on Wednesday to discuss ordering mammogram & refills on cancer drug that was prescribed by Dr. Bary Castilla.

## 2018-10-05 ENCOUNTER — Other Ambulatory Visit: Payer: Self-pay

## 2018-10-05 ENCOUNTER — Ambulatory Visit (INDEPENDENT_AMBULATORY_CARE_PROVIDER_SITE_OTHER): Payer: Medicare Other | Admitting: Family

## 2018-10-05 ENCOUNTER — Encounter: Payer: Self-pay | Admitting: Family

## 2018-10-05 DIAGNOSIS — Z853 Personal history of malignant neoplasm of breast: Secondary | ICD-10-CM | POA: Insufficient documentation

## 2018-10-05 DIAGNOSIS — E785 Hyperlipidemia, unspecified: Secondary | ICD-10-CM

## 2018-10-05 DIAGNOSIS — I10 Essential (primary) hypertension: Secondary | ICD-10-CM

## 2018-10-05 MED ORDER — METOPROLOL SUCCINATE ER 100 MG PO TB24: ORAL_TABLET | ORAL | 0 refills | Status: DC

## 2018-10-05 NOTE — Progress Notes (Signed)
Verbal consent for services obtained from patient prior to services given to TELEPHONE visit:   Location of call:  provider at work patient at home  Names of all persons present for services: Mia Paris, NP Chief complaint:   HLD- compliant with pravachol. No myalgias.   Had  Been seeing Dr [name] for h/o left breast cancer 1998; right breast cancer 2017.  Would like refill of femara and toprol.   HTN- 127/67 , 60 today which is her average. Not taking extra amlodipine 2.5mg  ; only if > 155.   Denies exertional chest pain or pressure, numbness or tingling radiating to left arm or jaw, palpitations, dizziness, frequent headaches, changes in vision, or shortness of breath.    History, background, results pertinent:   Diagnostic mammogram due 12/2018.  Appointment with nephrology A/P/next steps: Problem List Items Addressed This Visit      Cardiovascular and Mediastinum   Hypertension    At goal; will continue regimen.      Relevant Medications   metoprolol succinate (TOPROL-XL) 100 MG 24 hr tablet     Other   History of left breast cancer    Advised to establish surgery, Dr Genevive Bi, for surveillance.       HLD (hyperlipidemia)    Will continue to pravachol; pending cmp      Relevant Medications   metoprolol succinate (TOPROL-XL) 100 MG 24 hr tablet   History of right breast cancer   Relevant Orders   Ambulatory referral to General Surgery       I spent 25 min  discussing plan of care over the phone.

## 2018-10-05 NOTE — Patient Instructions (Addendum)
We will establish you with Dr Genevive Bi for ongoing surveillance and management of femara.   Today we discussed referrals, orders. Surgery; Dr Marta Lamas.   I have placed these orders in the system for you.  Please be sure to give [place] a call if you have not heard from our office regarding this. We should hear from [place] within ONE week with information regarding your appointment. If not, please let me know immediately.   You also need a lab appointment to ensure that your liver enzymes are normal on pravachol ( cholesterol medication). Please call the office to schedule as lab is in the system.  Stay safe!

## 2018-10-05 NOTE — Assessment & Plan Note (Signed)
Advised to establish surgery, Dr Genevive Bi, for surveillance.

## 2018-10-05 NOTE — Assessment & Plan Note (Signed)
Will continue to pravachol; pending cmp

## 2018-10-05 NOTE — Assessment & Plan Note (Signed)
At goal; will continue regimen.

## 2018-10-07 ENCOUNTER — Other Ambulatory Visit: Payer: Self-pay

## 2018-10-07 ENCOUNTER — Ambulatory Visit
Admission: RE | Admit: 2018-10-07 | Discharge: 2018-10-07 | Disposition: A | Discharge status: Home / Self Care | Payer: Medicare Other | Source: Ambulatory Visit | Attending: Nephrology | Admitting: Nephrology

## 2018-10-07 DIAGNOSIS — N183 Chronic kidney disease, stage 3 unspecified: Secondary | ICD-10-CM

## 2018-10-10 ENCOUNTER — Ambulatory Visit (INDEPENDENT_AMBULATORY_CARE_PROVIDER_SITE_OTHER): Payer: Medicare Other

## 2018-10-10 ENCOUNTER — Other Ambulatory Visit: Payer: Self-pay

## 2018-10-10 DIAGNOSIS — Z23 Encounter for immunization: Secondary | ICD-10-CM | POA: Diagnosis not present

## 2018-10-11 NOTE — Progress Notes (Signed)
Printed and mailed

## 2018-10-20 ENCOUNTER — Other Ambulatory Visit: Payer: Self-pay | Admitting: Internal Medicine

## 2018-10-20 ENCOUNTER — Other Ambulatory Visit (HOSPITAL_COMMUNITY): Payer: Self-pay | Admitting: Internal Medicine

## 2018-10-20 DIAGNOSIS — N281 Cyst of kidney, acquired: Secondary | ICD-10-CM

## 2018-10-24 ENCOUNTER — Other Ambulatory Visit: Payer: Self-pay

## 2018-10-24 DIAGNOSIS — Z170 Estrogen receptor positive status [ER+]: Secondary | ICD-10-CM

## 2018-10-24 DIAGNOSIS — C50411 Malignant neoplasm of upper-outer quadrant of right female breast: Secondary | ICD-10-CM

## 2018-11-02 ENCOUNTER — Ambulatory Visit: Payer: Medicare Other

## 2018-11-15 ENCOUNTER — Ambulatory Visit (INDEPENDENT_AMBULATORY_CARE_PROVIDER_SITE_OTHER): Payer: Medicare Other

## 2018-11-15 ENCOUNTER — Other Ambulatory Visit: Payer: Self-pay

## 2018-11-15 DIAGNOSIS — Z0000 Encounter for general adult medical examination without abnormal findings: Secondary | ICD-10-CM | POA: Diagnosis not present

## 2018-11-15 NOTE — Progress Notes (Addendum)
Subjective:   Mia Perez is a 85 y.o. female who presents for Medicare Annual (Subsequent) preventive examination.  Review of Systems:  No ROS.  Medicare Wellness Virtual Visit.  Visual/audio telehealth visit, UTA vital signs.   See social history for additional risk factors.   Cardiac Risk Factors include: advanced age (>53men, >32 women);hypertension     Objective:     Vitals: There were no vitals taken for this visit.  There is no height or weight on file to calculate BMI.  Advanced Directives 11/15/2018 01/17/2018 01/25/2017 07/15/2016 02/20/2016 01/30/2016 01/06/2016  Does Patient Have a Medical Advance Directive? No No No No Yes Yes Yes  Type of Advance Directive - - - - - Catering manager  Does patient want to make changes to medical advance directive? - - - - No - Patient declined No - Patient declined No - Patient declined  Copy of [place] in Chart? - - - - - No - copy requested No - copy requested  Would patient like information on creating a medical advance directive? No - Patient declined No - Patient declined No - Patient declined No - Patient declined - - -    Tobacco Social History   Tobacco Use  Smoking Status Never Smoker  Smokeless Tobacco Never Used     Counseling given: Not Answered   Clinical Intake:  Pre-visit preparation completed: Yes        Diabetes: No  How often do you need to have someone help you when you read instructions, pamphlets, or other written materials from your doctor or pharmacy?: 1 - Never  Interpreter Needed?: No     Past Medical History:  Diagnosis Date  . Anemia    H/O  . Arthritis   . Breast cancer ([place]) 02/10/1996   left, lumpectomy and radiation  . DVT (deep venous thrombosis) ([place]) 11/2014  . GERD (gastroesophageal reflux disease)   . Hypertension   . Lymphocytic colitis   . Personal history of radiation therapy 1998, 2017   BREAST CA  .  Ulcerative colitis ([place]) 2014  . Varicose veins    Past Surgical History:  Procedure Laterality Date  . BREAST EXCISIONAL BIOPSY Right 2014   NEG  . BREAST LUMPECTOMY Left 1998   radiation  . BREAST LUMPECTOMY WITH SENTINEL LYMPH NODE BIOPSY Right 12/30/2015   8 mm, T1b, N0; RUOQ, ER 95%; PR: 80 %, Her 2 neu not overexpressed.Mammosite Radiation.  Marland Kitchen BREAST SURGERY Right 2014   Microcalcifications excised with wire localization/ Dr Bary Castilla  . COLONOSCOPY  2012   Dr Earlean Shawl  . DILATION AND CURETTAGE OF UTERUS    . HERNIA REPAIR    . HYSTEROSCOPY    . NECK SURGERY    . PERIPHERAL VASCULAR CATHETERIZATION N/A 12/13/2014   Procedure: IVC Filter Insertion;  Surgeon: Algernon Huxley, MD;  Location: [facility];  Service: Cardiovascular;  Laterality: N/A;  . PERIPHERAL VASCULAR CATHETERIZATION  12/13/2014   Procedure: Lower Extremity Intervention;  Surgeon: Algernon Huxley, MD;  Location: [facility];  Service: Cardiovascular;;  . PERIPHERAL VASCULAR CATHETERIZATION N/A 02/20/2015   Procedure: IVC Filter Removal;  Surgeon: Algernon Huxley, MD;  Location: [facility];  Service: Cardiovascular;  Laterality: N/A;   Family History  Problem Relation Age of Onset  . Heart disease Mother   . Hypertension Mother   . Ovarian cancer Mother 44  . Hypertension Sister   .  Hypertension Brother   . Ovarian cancer Paternal Aunt 85  . Breast cancer Neg Hx    Social History   Socioeconomic History  . Marital status: Widowed    Spouse name: Not on file  . Number of children: 1  . Years of education: Not on file  . Highest education level: Not on file  Occupational History  . Occupation: Retired   Scientific laboratory technician  . Financial resource strain: Not hard at all  . Food insecurity    Worry: Never true    Inability: Never true  . Transportation needs    Medical: No    Non-medical: No  Tobacco Use  . Smoking status: Never Smoker  . Smokeless tobacco: Never Used  Substance and Sexual  Activity  . Alcohol use: No  . Drug use: No  . Sexual activity: Never  Lifestyle  . Physical activity    Days per week: Not on file    Minutes per session: Not on file  . Stress: Not at all  Relationships  . Social Herbalist on phone: Not on file    Gets together: Not on file    Attends religious service: Not on file    Active member of club or organization: Not on file    Attends meetings of clubs or organizations: Not on file    Relationship status: Not on file  Other Topics Concern  . Not on file  Social History Narrative   Lives in [place]. Husband passed away 24. [name], daughter.      Work - [employer] 13 years, then [place] - regular.       Outpatient Encounter Medications as of 11/15/2018  Medication Sig  . amLODipine (NORVASC) 2.5 MG tablet Take 1 tablet (2.5 mg total) by mouth daily. Take another 2.5mg  PO in the evening if blood pressure > 155/90.  Marland Kitchen amLODipine (NORVASC) 5 MG tablet Take 1 tablet (5 mg total) by mouth daily.  Marland Kitchen aspirin 325 MG tablet Take 325 mg by mouth daily.  . budesonide (ENTOCORT EC) 3 MG 24 hr capsule Take 1 capsule (3 mg total) by mouth daily. (Patient taking differently: Take 3 mg by mouth every other day. )  . Ca Carbonate-Mag Hydroxide (ROLAIDS PO) Take 1 tablet by mouth as needed.  . Calcium Carb-Cholecalciferol (CALCIUM-VITAMIN D) 500-200 MG-UNIT tablet Take 1 tablet by mouth daily.  . Cholecalciferol (VITAMIN D) 2000 units CAPS Take 2,000 Units by mouth daily.  . Cyanocobalamin (VITAMIN B-12) 5000 MCG TBDP Take 5,000 mcg by mouth every other day.   . diphenoxylate-atropine (LOMOTIL) 2.5-0.025 MG tablet TAKE 1 TABLET BY MOUTH 4 TIMES A DAY AS NEEDED FOR LOOSE STOOLS (Patient taking differently: Take 2-3 tablets daily as needed for loose stools)  . letrozole (FEMARA) 2.5 MG tablet TAKE 1 TABLET BY MOUTH DAILY  . loperamide (LOPERAMIDE A-D) 2 MG tablet Take 1 tablet (2 mg total) by  mouth 4 (four) times daily as needed for diarrhea or loose stools.  Marland Kitchen losartan (COZAAR) 100 MG tablet Take 1 tablet (100 mg total) by mouth every evening.  Marland Kitchen LUMIGAN 0.01 % SOLN Place 1 drop into both eyes at bedtime.   . metoprolol succinate (TOPROL-XL) 100 MG 24 hr tablet TAKE 1 TABLET (100 MG TOTAL) BY MOUTH DAILY. TAKE WITH OR IMMEDIATELY FOLLOWING A MEAL.  . Multiple Vitamins-Minerals (PRESERVISION AREDS 2 PO) Take 1 tablet by  mouth 2 (two) times daily.   Marland Kitchen neomycin-polymyxin-hydrocortisone (CORTISPORIN) 3.5-10000-1 OTIC suspension   . Polyvinyl Alcohol (LUBRICANT DROPS OP) Apply 1 drop to eye daily as needed (dry eyes).  . simethicone (MYLICON) 0000000 MG chewable tablet Chew 125 mg by mouth every morning.   Marland Kitchen VITAMINS C E PO Take by mouth.  . pravastatin (PRAVACHOL) 10 MG tablet Take 1 tablet (10 mg total) by mouth daily. (Patient not taking: Reported on 11/15/2018)  . [DISCONTINUED] estradiol (ESTRACE VAGINAL) 0.1 MG/GM vaginal cream at bedtime. Apply pea sized amount to urethra nightly   No facility-administered encounter medications on file as of 11/15/2018.     Activities of Daily Living In your present state of health, do you have any difficulty performing the following activities: 11/15/2018 10/05/2018  Hearing? N N  Vision? N N  Difficulty concentrating or making decisions? N N  Walking or climbing stairs? N N  Dressing or bathing? N N  Doing errands, shopping? N N  Preparing Food and eating ? N -  Using the Toilet? N -  In the past six months, have you accidently leaked urine? N -  Do you have problems with loss of bowel control? N -  Managing your Medications? N -  Managing your Finances? N -  Housekeeping or managing your Housekeeping? N -  Some recent data might be hidden    Patient Care Team: Burnard Hawthorne, FNP as PCP - General (Family Medicine) Bary Castilla, Forest Gleason, MD (General Surgery) Bary Castilla Forest Gleason, MD (General Surgery) Coral Spikes, DO as Consulting  Physician (Family Medicine)    Assessment:   This is a routine wellness examination for Shaleigh.  I connected with patient 11/15/18 at 11:30 AM EDT by an audio enabled telemedicine application and verified that I am speaking with the correct person using two identifiers. Patient stated full name and DOB. Patient gave permission to continue with virtual visit. Patient's location was at home and Nurse's location was at [place] office.   Health Maintenance Due: -Dexa Scan- ordered 03/15/18 Update all pending maintenance due as appropriate.   See completed HM at the end of note.   Eye: Visual acuity not assessed. Virtual visit. Wears corrective lenses. Followed by their ophthalmologist every 12 months.   Dental: Visits every 6 months.    Hearing: Demonstrates normal hearing during visit.  Safety:  Patient feels safe at home- yes Patient does have smoke detectors at home- yes Patient does wear sunscreen or protective clothing when in direct sunlight - yes Patient does wear seat belt when in a moving vehicle - yes Patient drives- yes Adequate lighting in walkways free from debris- yes Grab bars and handrails used as appropriate- yes Ambulates with no assistive device Cell phone or lifeline/life alert/medic alert on person when ambulating outside of the home- yes  Social: Alcohol intake - no     Smoking history- never   Smokers in home? none Illicit drug use? none  Depression: PHQ 2 &9 complete. See screening below. Denies irritability, anhedonia, sadness/tearfullness.  Stable.   Falls: See screening below.    Medication: No longer taking pravastatin due to increase loose stool.   Provider made aware.  She is monitoring her diet and increasing her physical activity.  Covid-19: Precautions and sickness symptoms discussed. Wears mask, social distancing, hand hygiene as appropriate.   Activities of Daily Living Patient denies needing assistance with: household chores, feeding  themselves, getting from bed to chair, getting to the toilet, bathing/showering, dressing, managing money,  or preparing meals.   Memory: Patient is alert. Patient denies difficulty focusing or concentrating. Correctly identified the president of the [place], season and recall. Patient likes to read for brain stimulation.  BMI- discussed the importance of a healthy diet, water intake and the benefits of aerobic exercise.  Educational material provided.  Physical activity- walking, no routine  Diet: regular Water: good intake  Other Providers Patient Care Team: Burnard Hawthorne, FNP as PCP - General (Family Medicine) Bary Castilla, Forest Gleason, MD (General Surgery) Bary Castilla, Forest Gleason, MD (General Surgery) Coral Spikes, DO as Consulting Physician (Family Medicine)  Exercise Activities and Dietary recommendations Current Exercise Habits: Home exercise routine, Type of exercise: walking, Intensity: Mild  Goals      Patient Stated   . Increase physical activity (pt-stated)     I want to walk more for exercise       Fall Risk Fall Risk  11/15/2018 10/05/2018 12/30/2017 01/30/2016 01/06/2016  Falls in the past year? 0 0 0 No No  Number falls in past yr: - - - - -  Risk for fall due to : - - - - -  Follow up - Falls evaluation completed - - -   Timed Get Up and Go performed: no, virtual visit  Depression Screen PHQ 2/9 Scores 11/15/2018 10/05/2018 01/30/2016 01/06/2016  PHQ - 2 Score 0 0 0 0     Cognitive Function MMSE - Mini Mental State Exam 01/30/2015  Orientation to time 5  Orientation to Place 5  Registration 3  Attention/ Calculation 5  Recall 3  Language- name 2 objects 2  Language- repeat 1  Language- follow 3 step command 3  Language- read & follow direction 1  Write a sentence 1  Copy design 1  Total score 30     6CIT Screen 11/15/2018 01/30/2016  What Year? 0 points 0 points  What month? 0 points 0 points  What time? 0 points 0 points  Count back from 20 0  points 0 points  Months in reverse 0 points 0 points  Repeat phrase 0 points -  Total Score 0 -    Immunization History  Administered Date(s) Administered  . Fluad Quad(high Dose 65+) 10/10/2018  . Influenza Split 02/23/2012, 10/19/2015  . Influenza,inj,Quad PF,6+ Mos 03/06/2013, 12/12/2013, 10/16/2014  . Influenza-Unspecified 10/13/2016  . Pneumococcal Conjugate-13 03/06/2013  . Pneumococcal Polysaccharide-23 08/05/2010  . Tdap 08/04/2009   Screening Tests Health Maintenance  Topic Date Due  . TETANUS/TDAP  08/05/2019  . INFLUENZA VACCINE  Completed  . DEXA SCAN  Completed  . PNA vac Low Risk Adult  Completed      Plan:   Keep all routine maintenance appointments.   Medicare Attestation I have personally reviewed: The patient's medical and social history Their use of alcohol, tobacco or illicit drugs Their current medications and supplements The patient's functional ability including ADLs,fall risks, home safety risks, cognitive, and hearing and visual impairment Diet and physical activities Evidence for depression   In addition, I have reviewed and discussed with patient certain preventive protocols, quality metrics, and best practice recommendations. A written personalized care plan for preventive services as well as general preventive health recommendations were provided to patient via mail.     OBrien-Blaney, Lindamarie Maclachlan L, LPN  [id number]    I have reviewed the above information and agree with above.   Deborra Medina, MD

## 2018-11-15 NOTE — Patient Instructions (Addendum)
  Mia Perez , Thank you for taking time to come for your Medicare Wellness Visit. I appreciate your ongoing commitment to your health goals. Please review the following plan we discussed and let me know if I can assist you in the future.   These are the goals we discussed: Goals      Patient Stated   . Increase physical activity (pt-stated)     I want to walk more for exercise       This is a list of the screening recommended for you and due dates:  Health Maintenance  Topic Date Due  . Tetanus Vaccine  08/05/2019  . Flu Shot  Completed  . DEXA scan (bone density measurement)  Completed  . Pneumonia vaccines  Completed

## 2018-12-26 ENCOUNTER — Ambulatory Visit
Admission: RE | Admit: 2018-12-26 | Discharge: 2018-12-26 | Disposition: A | Discharge status: Home / Self Care | Payer: Medicare Other | Source: Ambulatory Visit | Attending: Surgery | Admitting: Surgery

## 2018-12-26 DIAGNOSIS — Z170 Estrogen receptor positive status [ER+]: Secondary | ICD-10-CM | POA: Insufficient documentation

## 2018-12-26 DIAGNOSIS — C50411 Malignant neoplasm of upper-outer quadrant of right female breast: Secondary | ICD-10-CM

## 2018-12-27 ENCOUNTER — Telehealth: Payer: Self-pay

## 2018-12-27 NOTE — Telephone Encounter (Signed)
Patient notified of mammogram and reminded of follow up appointment 01/16/2019.

## 2018-12-28 ENCOUNTER — Ambulatory Visit: Payer: Medicare Other | Admitting: Surgery

## 2019-01-02 ENCOUNTER — Ambulatory Visit: Payer: Medicare Other | Admitting: Surgery

## 2019-01-16 ENCOUNTER — Ambulatory Visit: Payer: Medicare Other | Admitting: Surgery

## 2019-01-19 ENCOUNTER — Other Ambulatory Visit: Payer: Self-pay | Admitting: Family

## 2019-01-19 DIAGNOSIS — I10 Essential (primary) hypertension: Secondary | ICD-10-CM

## 2019-01-23 ENCOUNTER — Other Ambulatory Visit: Payer: Self-pay

## 2019-01-23 ENCOUNTER — Encounter: Payer: Self-pay | Admitting: Radiation Oncology

## 2019-01-23 ENCOUNTER — Ambulatory Visit
Admission: RE | Admit: 2019-01-23 | Discharge: 2019-01-23 | Disposition: A | Discharge status: Home / Self Care | Payer: Medicare Other | Source: Ambulatory Visit | Attending: Radiation Oncology | Admitting: Radiation Oncology

## 2019-01-23 VITALS — BP 143/72 | HR 57 | Temp 97.8°F | Resp 16 | Wt 151.0 lb

## 2019-01-23 DIAGNOSIS — Z170 Estrogen receptor positive status [ER+]: Secondary | ICD-10-CM | POA: Insufficient documentation

## 2019-01-23 DIAGNOSIS — Z923 Personal history of irradiation: Secondary | ICD-10-CM | POA: Diagnosis not present

## 2019-01-23 DIAGNOSIS — Z79811 Long term (current) use of aromatase inhibitors: Secondary | ICD-10-CM | POA: Diagnosis not present

## 2019-01-23 DIAGNOSIS — C50411 Malignant neoplasm of upper-outer quadrant of right female breast: Secondary | ICD-10-CM | POA: Insufficient documentation

## 2019-01-23 NOTE — Progress Notes (Signed)
Radiation Oncology Follow up Note  Name: Mia Perez   Date:   01/23/2019 MRN:  [id number] DOB: 1933/04/25    This 85 y.o. female presents to the clinic today for 3-year follow-up status post accelerated partial breast radiation to her right breast for stage I invasive mammary carcinoma.  REFERRING PROVIDER: Glendon Axe, MD  HPI: Patient is an 85 year old female now out 3 years having completed accelerated partial breast radiation to right breast for stage I invasive mammary carcinoma ER/PR positive.  Seen today in routine follow-up she is doing well.  She specifically denies breast tenderness cough or bone pain..  She had mammograms last month which I have reviewed were BI-RADS 2 benign.  She is currently on Femara tolerating that well without side effect.  COMPLICATIONS OF TREATMENT: none  FOLLOW UP COMPLIANCE: keeps appointments   PHYSICAL EXAM:  BP (!) 143/72   Pulse (!) 57   Temp 97.8 F (36.6 C)   Resp 16   Wt 151 lb (68.5 kg)   SpO2 99%   BMI 22.96 kg/m  Lungs are clear to A&P cardiac examination essentially unremarkable with regular rate and rhythm. No dominant mass or nodularity is noted in either breast in 2 positions examined. Incision is well-healed. No axillary or supraclavicular adenopathy is appreciated. Cosmetic result is excellent.  Well-developed well-nourished patient in NAD. HEENT reveals PERLA, EOMI, discs not visualized.  Oral cavity is clear. No oral mucosal lesions are identified. Neck is clear without evidence of cervical or supraclavicular adenopathy. Lungs are clear to A&P. Cardiac examination is essentially unremarkable with regular rate and rhythm without murmur rub or thrill. Abdomen is benign with no organomegaly or masses noted. Motor sensory and DTR levels are equal and symmetric in the upper and lower extremities. Cranial nerves II through XII are grossly intact. Proprioception is intact. No peripheral adenopathy or edema is identified. No motor  or sensory levels are noted. Crude visual fields are within normal range.  RADIOLOGY RESULTS: Mammograms reviewed compatible with above-stated findings  PLAN: Present time patient is doing well now at 3 years with no evidence of disease.  I am pleased with her overall progress.  She continues close follow-up care with medical oncology and surgeon.  I am going to discontinue follow-up care based on her advanced age and difficulty with transportation.  Patient knows to call with any concerns.  I would like to take this opportunity to thank you for allowing me to participate in the care of your patient.Noreene Filbert, MD

## 2019-02-13 ENCOUNTER — Telehealth: Payer: Self-pay | Admitting: Family

## 2019-02-17 NOTE — Telephone Encounter (Signed)
error 

## 2019-02-22 ENCOUNTER — Other Ambulatory Visit: Payer: Self-pay

## 2019-02-22 ENCOUNTER — Ambulatory Visit (INDEPENDENT_AMBULATORY_CARE_PROVIDER_SITE_OTHER): Payer: Medicare PPO | Admitting: Family

## 2019-02-22 ENCOUNTER — Encounter: Payer: Self-pay | Admitting: Family

## 2019-02-22 VITALS — BP 131/70 | HR 67 | Ht 68.0 in | Wt 147.0 lb

## 2019-02-22 DIAGNOSIS — E785 Hyperlipidemia, unspecified: Secondary | ICD-10-CM

## 2019-02-22 DIAGNOSIS — C50411 Malignant neoplasm of upper-outer quadrant of right female breast: Secondary | ICD-10-CM

## 2019-02-22 DIAGNOSIS — I10 Essential (primary) hypertension: Secondary | ICD-10-CM

## 2019-02-22 DIAGNOSIS — Z170 Estrogen receptor positive status [ER+]: Secondary | ICD-10-CM | POA: Diagnosis not present

## 2019-02-22 MED ORDER — AMLODIPINE BESYLATE 5 MG PO TABS: 5.0000 mg | ORAL_TABLET | Freq: Every day | ORAL | 3 refills | Status: DC

## 2019-02-22 MED ORDER — AMLODIPINE BESYLATE 2.5 MG PO TABS: 2.5000 mg | ORAL_TABLET | Freq: Every day | ORAL | 3 refills | Status: DC

## 2019-02-22 MED ORDER — EZETIMIBE 10 MG PO TABS: 10.0000 mg | ORAL_TABLET | Freq: Every day | ORAL | 3 refills | Status: DC

## 2019-02-22 MED ORDER — LOSARTAN POTASSIUM 100 MG PO TABS: 100.0000 mg | ORAL_TABLET | Freq: Every evening | ORAL | 3 refills | Status: DC

## 2019-02-22 MED ORDER — METOPROLOL SUCCINATE ER 100 MG PO TB24: ORAL_TABLET | ORAL | 3 refills | Status: DC

## 2019-02-22 NOTE — Assessment & Plan Note (Addendum)
Advised to trial taking the additional amlodipine 2.5mg  when BP > 145/80 ( she normally takes when > 155/80) as I would prefer her blood pressure to be closer to the 130/80 range. F/u 3 months

## 2019-02-22 NOTE — Assessment & Plan Note (Signed)
Trial of zetia since statin intolerant ( diarrhea). Will check lipids at f/u.

## 2019-02-22 NOTE — Assessment & Plan Note (Signed)
Follows with Dr [name], will follow.

## 2019-02-22 NOTE — Progress Notes (Signed)
Verbal consent for services obtained from patient prior to services given to TELEPHONE visit:   Location of call:  provider at work patient at home  Names of all persons present for services: Mia Paris, NP Chief complaint:  Feels well today. No concerns.   Had stopped pravachol in the past due diarrhea which has improved.   HTN- on average, 140/80. If greater than 155 , takes the amlodipine. No cp, sob. ha.   CKD- follows with Dr [name]; renal US 09/2018.   H/o breast cancer- follows with Dr [name], on the [facility] for another year.  UTD mammogram 12/2018  History, background, results pertinent:   Due dexa- declines DEXA due to covid as this time.   A/P/next steps: Problem List Items Addressed This Visit      Cardiovascular and Mediastinum   Hypertension    Advised to trial taking the additional amlodipine 2.5mg  when BP > 145/80 ( she normally takes when > 155/80) as I would prefer her blood pressure to be closer to the 130/80 range. F/u 3 months      Relevant Medications   ezetimibe (ZETIA) 10 MG tablet   amLODipine (NORVASC) 2.5 MG tablet     Other   HLD (hyperlipidemia) - Primary    Trial of zetia since statin intolerant ( diarrhea). Will check lipids at f/u.       Relevant Medications   ezetimibe (ZETIA) 10 MG tablet   amLODipine (NORVASC) 2.5 MG tablet   Malignant neoplasm of upper-outer quadrant of right breast in female, estrogen receptor positive ([place])    Follows with Dr [name], will follow.        Of note, politely declines DEXA at this time.  She will call when she is ready to schedule this   I spent 20 min  discussing plan of care over the phone.

## 2019-03-27 DIAGNOSIS — R600 Localized edema: Secondary | ICD-10-CM | POA: Insufficient documentation

## 2019-03-27 DIAGNOSIS — R809 Proteinuria, unspecified: Secondary | ICD-10-CM | POA: Insufficient documentation

## 2019-03-27 DIAGNOSIS — Q6102 Congenital multiple renal cysts: Secondary | ICD-10-CM | POA: Insufficient documentation

## 2019-05-24 ENCOUNTER — Ambulatory Visit: Payer: Medicare PPO | Admitting: Family

## 2019-05-24 ENCOUNTER — Encounter: Payer: Self-pay | Admitting: Family

## 2019-05-24 ENCOUNTER — Other Ambulatory Visit: Payer: Self-pay

## 2019-05-24 VITALS — BP 118/74 | HR 58 | Temp 95.6°F | Ht 67.0 in | Wt 144.4 lb

## 2019-05-24 DIAGNOSIS — H3530 Unspecified macular degeneration: Secondary | ICD-10-CM | POA: Insufficient documentation

## 2019-05-24 DIAGNOSIS — N183 Chronic kidney disease, stage 3 unspecified: Secondary | ICD-10-CM

## 2019-05-24 DIAGNOSIS — Z780 Asymptomatic menopausal state: Secondary | ICD-10-CM | POA: Diagnosis not present

## 2019-05-24 DIAGNOSIS — K52832 Lymphocytic colitis: Secondary | ICD-10-CM

## 2019-05-24 DIAGNOSIS — E785 Hyperlipidemia, unspecified: Secondary | ICD-10-CM

## 2019-05-24 DIAGNOSIS — C50912 Malignant neoplasm of unspecified site of left female breast: Secondary | ICD-10-CM

## 2019-05-24 DIAGNOSIS — C50911 Malignant neoplasm of unspecified site of right female breast: Secondary | ICD-10-CM

## 2019-05-24 DIAGNOSIS — I10 Essential (primary) hypertension: Secondary | ICD-10-CM

## 2019-05-24 DIAGNOSIS — I82511 Chronic embolism and thrombosis of right femoral vein: Secondary | ICD-10-CM

## 2019-05-24 NOTE — Assessment & Plan Note (Signed)
Well-controlled, continue current regimen 

## 2019-05-24 NOTE — Assessment & Plan Note (Signed)
Stable at baseline. Will follow

## 2019-05-24 NOTE — Patient Instructions (Signed)
Labs when you can Please call  and schedule your bone density scan as discussed.   [place]  [place], [place]

## 2019-05-24 NOTE — Assessment & Plan Note (Signed)
Patient agrees to restart Zetia since her diarrhea is at baseline at this time.  she will let me know how she doing and we will check lipids at follow-up

## 2019-05-24 NOTE — Progress Notes (Signed)
Subjective:    [name]: Mia Perez, female    DOB: May 25, 1933, 86 y.o.   MRN: [id number]  CC: Mia Perez is a 86 y.o. female who presents today for follow up.   HPI:  HTN-at home BP are 'good' . Cannot remember last time took a 2.5 mg amlodipine for BP > 145/90.  Denies exertional chest pain or pressure, numbness or tingling radiating to left arm or jaw, palpitations, dizziness, frequent headaches, changes in vision, or shortness of breath.   HLD- pravachol worsened diarrhea. She is not the zetia currently as concerned it may exacerbate diarrhea.    Follows with Mia Perez for CKD, Crt 1.32 03/2019  H/o right (2017) and left ( 1998) breast cancer- follows with Dr Chrystal/ Dr [name]. On femara until 11/2019.   Lymphocytic colitis- 'better controlled' . Stools remain loose however not diarrhea. Feels at baseline at this time.  Dr Earlean Shawl- 10/2018 trial off the pravastatin, continue lomotil, dc budesonide  H/o right DVT, 12/2014. Stays on 325mg  ASA for this reason. Wears compression stockings.      Mammogram UTD 12/2018  HISTORY:  Past Medical History:  Diagnosis Date  . Anemia    H/O  . Arthritis   . Breast cancer ([place]) 02/10/1996   left, lumpectomy and radiation  . Breast cancer ([place]) 2017   right  . DVT (deep venous thrombosis) ([place]) 11/2014  . GERD (gastroesophageal reflux disease)   . Hypertension   . Lymphocytic colitis   . Personal history of radiation therapy 1998, 2017   BREAST CA  . Ulcerative colitis (Mia Perez) 2014  . Varicose veins    Past Surgical History:  Procedure Laterality Date  . BREAST EXCISIONAL BIOPSY Right 2014   NEG  . BREAST LUMPECTOMY Left 1998   radiation  . BREAST LUMPECTOMY Right 2017  . BREAST LUMPECTOMY WITH SENTINEL LYMPH NODE BIOPSY Right 12/30/2015   8 mm, T1b, N0; RUOQ, ER 95%; PR: 80 %, Her 2 neu not overexpressed.Mammosite Radiation.  Marland Kitchen BREAST SURGERY Right 2014   Microcalcifications excised with wire localization/ Dr  Bary Castilla  . COLONOSCOPY  2012   Dr Earlean Shawl  . DILATION AND CURETTAGE OF UTERUS    . HERNIA REPAIR    . HYSTEROSCOPY    . NECK SURGERY    . PERIPHERAL VASCULAR CATHETERIZATION N/A 12/13/2014   Procedure: IVC Filter Insertion;  Surgeon: Algernon Huxley, MD;  Location: [place] [facility];  Service: Cardiovascular;  Laterality: N/A;  . PERIPHERAL VASCULAR CATHETERIZATION  12/13/2014   Procedure: Lower Extremity Intervention;  Surgeon: Algernon Huxley, MD;  Location: [place] [facility];  Service: Cardiovascular;;  . PERIPHERAL VASCULAR CATHETERIZATION N/A 02/20/2015   Procedure: IVC Filter Removal;  Surgeon: Algernon Huxley, MD;  Location: [place] [facility];  Service: Cardiovascular;  Laterality: N/A;   Family History  Problem Relation Age of Onset  . Heart disease Mother   . Hypertension Mother   . Ovarian cancer Mother 29  . Hypertension Sister   . Hypertension Brother   . Ovarian cancer Paternal Aunt 85  . Breast cancer Neg Hx     Allergies: Pravachol [pravastatin] and Penicillins Current Outpatient Medications on File Prior to Visit  Medication Sig Dispense Refill  . amLODipine (NORVASC) 2.5 MG tablet Take 1 tablet (2.5 mg total) by mouth daily. Take another 2.5mg  PO in the evening if blood pressure > 145/90. 90 tablet 3  . amLODipine (NORVASC) 5 MG tablet Take 1 tablet (5 mg total)  by mouth daily. 90 tablet 3  . aspirin 325 MG tablet Take 325 mg by mouth daily.    . budesonide (ENTOCORT EC) 3 MG 24 hr capsule Take 1 capsule (3 mg total) by mouth daily. 90 capsule 3  . Ca Carbonate-Mag Hydroxide (ROLAIDS PO) Take 1 tablet by mouth as needed.    . Calcium Carbonate-Vit D-Min (CALCIUM 1200 PO) Take 1 tablet by mouth daily.    . Cyanocobalamin (VITAMIN B-12) 5000 MCG TBDP Take 5,000 mcg by mouth every other day.     . diphenoxylate-atropine (LOMOTIL) 2.5-0.025 MG tablet TAKE 1 TABLET BY MOUTH 4 TIMES A DAY AS NEEDED FOR LOOSE STOOLS (Patient taking differently: Take 2-3 tablets daily as needed  for loose stools) 360 tablet 0  . dorzolamidel-timolol (COSOPT) 22.3-6.8 MG/ML SOLN ophthalmic solution Place 1 drop into both eyes 2 (two) times daily. Take one drop in both eyes in the morning & evening before bed.    . letrozole (FEMARA) 2.5 MG tablet TAKE 1 TABLET BY MOUTH DAILY 90 tablet 4  . losartan (COZAAR) 100 MG tablet Take 1 tablet (100 mg total) by mouth every evening. 90 tablet 3  . LUMIGAN 0.01 % SOLN Place 1 drop into both eyes at bedtime.     . metoprolol succinate (TOPROL-XL) 100 MG 24 hr tablet TAKE 1 TABLET (100 MG TOTAL) BY MOUTH DAILY. TAKE WITH OR IMMEDIATELY FOLLOWING A MEAL. APPT NEEDED 90 tablet 3  . Multiple Vitamins-Minerals (PRESERVISION AREDS 2 PO) Take 1 tablet by mouth 2 (two) times daily.     . simethicone (MYLICON) 0000000 MG chewable tablet Chew 125 mg by mouth every morning.     . ezetimibe (ZETIA) 10 MG tablet Take 1 tablet (10 mg total) by mouth daily. (Patient not taking: Reported on 05/24/2019) 90 tablet 3   No current facility-administered medications on file prior to visit.    Social History   Tobacco Use  . Smoking status: Never Smoker  . Smokeless tobacco: Never Used  Substance Use Topics  . Alcohol use: No  . Drug use: No    Review of Systems  Constitutional: Negative for chills and fever.  Respiratory: Negative for cough and shortness of breath.   Cardiovascular: Negative for chest pain, palpitations and leg swelling.  Gastrointestinal: Positive for diarrhea (chronic). Negative for nausea and vomiting.      Objective:    BP 118/74   Pulse (!) 58   Temp (!) 95.6 F (35.3 C) (Temporal)   Ht 5\' 7"  (1.702 m)   Wt 144 lb 6.4 oz (65.5 kg)   SpO2 98%   BMI 22.62 kg/m  BP Readings from Last 3 Encounters:  05/24/19 118/74  02/22/19 131/70  01/23/19 (!) 143/72   Wt Readings from Last 3 Encounters:  05/24/19 144 lb 6.4 oz (65.5 kg)  02/22/19 147 lb (66.7 kg)  01/23/19 151 lb (68.5 kg)    Physical Exam Vitals reviewed.  Constitutional:       Appearance: She is well-developed.  Eyes:     Conjunctiva/sclera: Conjunctivae normal.  Cardiovascular:     Rate and Rhythm: Normal rate and regular rhythm.     Pulses: Normal pulses.     Heart sounds: Normal heart sounds.     Comments: Wearing compression stockings bilaterally  Pulmonary:     Effort: Pulmonary effort is normal.     Breath sounds: Normal breath sounds. No wheezing, rhonchi or rales.  Musculoskeletal:     Right lower leg: No edema.  Left lower leg: No edema.  Skin:    General: Skin is warm and dry.  Neurological:     Mental Status: She is alert.  Psychiatric:        Speech: Speech normal.        Behavior: Behavior normal.        Thought Content: Thought content normal.        Assessment & Plan:   Problem List Items Addressed This Visit      Cardiovascular and Mediastinum   DVT (deep venous thrombosis) (HCC)   Essential hypertension - Primary    Well-controlled, continue current regimen.      Relevant Orders   Hemoglobin A1c   Comprehensive metabolic panel     Digestive   Lymphocytic colitis    Stable at baseline. Will follow        Genitourinary   Chronic kidney disease, stage 3 unspecified   Relevant Orders   Hemoglobin A1c   Comprehensive metabolic panel     Other   Breast cancer ([place])   Hyperlipidemia, unspecified    Patient agrees to restart Zetia since her diarrhea is at baseline at this time.  she will let me know how she doing and we will check lipids at follow-up       Other Visit Diagnoses    Asymptomatic menopausal state       Relevant Orders   DG Bone Density    Of note, patient will schedule bone density   I have discontinued Isabele B. Fussell's Vitamin D, Polyvinyl Alcohol (LUBRICANT DROPS OP), calcium-vitamin D, VITAMINS C E PO, and neomycin-polymyxin-hydrocortisone. I am also having her maintain her budesonide, diphenoxylate-atropine, Lumigan, aspirin, Multiple Vitamins-Minerals (PRESERVISION AREDS 2 PO),  simethicone, Vitamin B-12, Ca Carbonate-Mag Hydroxide (ROLAIDS PO), letrozole, metoprolol succinate, losartan, amLODipine, ezetimibe, amLODipine, dorzolamidel-timolol, and Calcium Carbonate-Vit D-Min (CALCIUM 1200 PO).   No orders of the defined types were placed in this encounter.   Return precautions given.   Risks, benefits, and alternatives of the medications and treatment plan prescribed today were discussed, and patient expressed understanding.   Education regarding symptom management and diagnosis given to patient on AVS.  Continue to follow with Burnard Hawthorne, FNP for routine health maintenance.   Mia Perez and I agreed with plan.   Mable Paris, FNP

## 2019-06-01 ENCOUNTER — Other Ambulatory Visit (INDEPENDENT_AMBULATORY_CARE_PROVIDER_SITE_OTHER): Payer: Medicare PPO

## 2019-06-01 ENCOUNTER — Other Ambulatory Visit: Payer: Self-pay

## 2019-06-01 DIAGNOSIS — N183 Chronic kidney disease, stage 3 unspecified: Secondary | ICD-10-CM

## 2019-06-01 DIAGNOSIS — I10 Essential (primary) hypertension: Secondary | ICD-10-CM | POA: Diagnosis not present

## 2019-06-01 LAB — COMPREHENSIVE METABOLIC PANEL
ALT: 9 U/L (ref 0–35)
AST: 13 U/L (ref 0–37)
Albumin: 4.1 g/dL (ref 3.5–5.2)
Alkaline Phosphatase: 76 U/L (ref 39–117)
BUN: 27 mg/dL — ABNORMAL HIGH (ref 6–23)
CO2: 27 mEq/L (ref 19–32)
Calcium: 9.8 mg/dL (ref 8.4–10.5)
Chloride: 104 mEq/L (ref 96–112)
Creatinine, Ser: 1.15 mg/dL (ref 0.40–1.20)
GFR: 45.03 mL/min — ABNORMAL LOW (ref >60.00)
Glucose, Bld: 98 mg/dL (ref 70–99)
Potassium: 3.8 mEq/L (ref 3.5–5.1)
Sodium: 138 mEq/L (ref 135–145)
Total Bilirubin: 0.4 mg/dL (ref 0.2–1.2)
Total Protein: 7.5 g/dL (ref 6.0–8.3)

## 2019-06-01 LAB — HEMOGLOBIN A1C: Hgb A1c MFr Bld: 6 % (ref 4.6–6.5)

## 2019-06-05 ENCOUNTER — Ambulatory Visit
Admission: RE | Admit: 2019-06-05 | Discharge: 2019-06-05 | Disposition: A | Discharge status: Home / Self Care | Payer: Medicare PPO | Source: Ambulatory Visit | Attending: Family | Admitting: Family

## 2019-06-05 DIAGNOSIS — Z780 Asymptomatic menopausal state: Secondary | ICD-10-CM | POA: Diagnosis not present

## 2019-06-28 ENCOUNTER — Ambulatory Visit: Payer: Medicare PPO | Admitting: Family

## 2019-08-30 ENCOUNTER — Ambulatory Visit (INDEPENDENT_AMBULATORY_CARE_PROVIDER_SITE_OTHER): Payer: Medicare PPO | Admitting: Family

## 2019-08-30 ENCOUNTER — Other Ambulatory Visit: Payer: Self-pay

## 2019-08-30 DIAGNOSIS — E785 Hyperlipidemia, unspecified: Secondary | ICD-10-CM | POA: Diagnosis not present

## 2019-08-30 DIAGNOSIS — K52832 Lymphocytic colitis: Secondary | ICD-10-CM | POA: Diagnosis not present

## 2019-08-30 DIAGNOSIS — I10 Essential (primary) hypertension: Secondary | ICD-10-CM

## 2019-08-30 DIAGNOSIS — G4709 Other insomnia: Secondary | ICD-10-CM | POA: Diagnosis not present

## 2019-08-30 NOTE — Assessment & Plan Note (Signed)
Chronic.  Will follow

## 2019-08-30 NOTE — Progress Notes (Signed)
Subjective:    [name]: Mia Perez, female    DOB: 05/12/1933, 86 y.o.   MRN: [id number]  CC: Mia Perez is a 86 y.o. female who presents today for follow up.   HPI: Patient complains of long history of trouble staying asleep, occasionally trouble falling asleep.  Symptoms ongoing for the past 7 years since she became a widow.  She currently lives alone.  She does not feel like anxiety or depression is contributory.  She does feel like her lack of activity may be contributing.  She does housework however no longer doing yard work.  She states she exercises with walking 1 mile at the [facility] twice a week.  She denies any shortness of breath, chest pain or leg swelling.  Hypertension-she is compliant with medications.  She notes at home she calculate her average and her systolic blood pressure averaged at 135.  She has only had to take the additional dose of amlodipine 2.5 mg 7 times in the past 3 months.   Hyperlipidemia-she is currently not on Zetia as she was concerned this would aggravate her loose stools.  She would like to try Zetia prior to cholesterol panel being drawn.  Colitis-this waxes and wanes which is typical for her.  She will have bouts of loose stool which are brown in color.  She doesn't describe her stool as 'diarrhea.' No blood, diarrhea, fever or abdominal pain.  She follows with GI, Dr. Earlean Perez.  Chronic kidney disease-she continues to follow with Dr. Candiss Perez, nephrology.  Up-to-date on DEXA and mammogram.   HISTORY:  Past Medical History:  Diagnosis Date  . Anemia    H/O  . Arthritis   . Breast cancer ([place]) 02/10/1996   left, lumpectomy and radiation  . Breast cancer ([place]) 2017   right  . DVT (deep venous thrombosis) ([place]) 11/2014  . GERD (gastroesophageal reflux disease)   . Hypertension   . Lymphocytic colitis   . Personal history of radiation therapy 1998, 2017   BREAST CA  . Ulcerative colitis ([place]) 2014  . Varicose veins    Past Surgical  History:  Procedure Laterality Date  . BREAST EXCISIONAL BIOPSY Right 2014   NEG  . BREAST LUMPECTOMY Left 1998   radiation  . BREAST LUMPECTOMY Right 2017  . BREAST LUMPECTOMY WITH SENTINEL LYMPH NODE BIOPSY Right 12/30/2015   8 mm, T1b, N0; RUOQ, ER 95%; PR: 80 %, Her 2 neu not overexpressed.Mammosite Radiation.  Marland Kitchen BREAST SURGERY Right 2014   Microcalcifications excised with wire localization/ Dr Mia Perez  . COLONOSCOPY  2012   Dr Mia Perez  . DILATION AND CURETTAGE OF UTERUS    . HERNIA REPAIR    . HYSTEROSCOPY    . NECK SURGERY    . PERIPHERAL VASCULAR CATHETERIZATION N/A 12/13/2014   Procedure: IVC Filter Insertion;  Surgeon: Mia Huxley, MD;  Location: [place] [facility];  Service: Cardiovascular;  Laterality: N/A;  . PERIPHERAL VASCULAR CATHETERIZATION  12/13/2014   Procedure: Lower Extremity Intervention;  Surgeon: Mia Huxley, MD;  Location: [place] [facility];  Service: Cardiovascular;;  . PERIPHERAL VASCULAR CATHETERIZATION N/A 02/20/2015   Procedure: IVC Filter Removal;  Surgeon: Mia Huxley, MD;  Location: [facility];  Service: Cardiovascular;  Laterality: N/A;   Family History  Problem Relation Age of Onset  . Heart disease Mother   . Hypertension Mother   . Ovarian cancer Mother 41  . Hypertension Sister   . Hypertension Brother   .  Ovarian cancer Paternal Aunt 85  . Breast cancer Neg Hx     Allergies: Pravachol [pravastatin] and Penicillins Current Outpatient Medications on File Prior to Visit  Medication Sig Dispense Refill  . amLODipine (NORVASC) 2.5 MG tablet Take 1 tablet (2.5 mg total) by mouth daily. Take another 2.5mg  PO in the evening if blood pressure > 145/90. 90 tablet 3  . amLODipine (NORVASC) 5 MG tablet Take 1 tablet (5 mg total) by mouth daily. 90 tablet 3  . aspirin 325 MG tablet Take 325 mg by mouth daily.    . budesonide (ENTOCORT EC) 3 MG 24 hr capsule Take 1 capsule (3 mg total) by mouth daily. 90 capsule 3  . Ca Carbonate-Mag  Hydroxide (ROLAIDS PO) Take 1 tablet by mouth as needed.    . Calcium Carbonate-Vit D-Min (CALCIUM 1200 PO) Take 1 tablet by mouth daily.    . Cyanocobalamin (VITAMIN B-12) 5000 MCG TBDP Take 5,000 mcg by mouth every other day.     . diphenoxylate-atropine (LOMOTIL) 2.5-0.025 MG tablet TAKE 1 TABLET BY MOUTH 4 TIMES A DAY AS NEEDED FOR LOOSE STOOLS (Patient taking differently: Take 2-3 tablets daily as needed for loose stools) 360 tablet 0  . dorzolamidel-timolol (COSOPT) 22.3-6.8 MG/ML SOLN ophthalmic solution Place 1 drop into both eyes 2 (two) times daily. Take one drop in both eyes in the morning & evening before bed.    . ezetimibe (ZETIA) 10 MG tablet Take 1 tablet (10 mg total) by mouth daily. (Patient not taking: Reported on 05/24/2019) 90 tablet 3  . letrozole (FEMARA) 2.5 MG tablet TAKE 1 TABLET BY MOUTH DAILY 90 tablet 4  . losartan (COZAAR) 100 MG tablet Take 1 tablet (100 mg total) by mouth every evening. 90 tablet 3  . LUMIGAN 0.01 % SOLN Place 1 drop into both eyes at bedtime.     . metoprolol succinate (TOPROL-XL) 100 MG 24 hr tablet TAKE 1 TABLET (100 MG TOTAL) BY MOUTH DAILY. TAKE WITH OR IMMEDIATELY FOLLOWING A MEAL. APPT NEEDED 90 tablet 3  . Multiple Vitamins-Minerals (PRESERVISION AREDS 2 PO) Take 1 tablet by mouth 2 (two) times daily.     . simethicone (MYLICON) 007 MG chewable tablet Chew 125 mg by mouth every morning.      No current facility-administered medications on file prior to visit.    Social History   Tobacco Use  . Smoking status: Never Smoker  . Smokeless tobacco: Never Used  Substance Use Topics  . Alcohol use: No  . Drug use: No    Review of Systems  Constitutional: Negative for chills and fever.  Respiratory: Negative for cough.   Cardiovascular: Negative for chest pain, palpitations and leg swelling.  Gastrointestinal: Negative for abdominal pain, blood in stool, constipation, diarrhea, nausea and vomiting.  Psychiatric/Behavioral: Positive for  sleep disturbance. The patient is not nervous/anxious.       Objective:    BP 140/78   Pulse 65   Temp 98 F (36.7 C)   Ht 5' 7.01" (1.702 m)   Wt 147 lb 6.4 oz (66.9 kg)   SpO2 95%   BMI 23.08 kg/m  BP Readings from Last 3 Encounters:  08/30/19 140/78  05/24/19 118/74  02/22/19 131/70   Wt Readings from Last 3 Encounters:  08/30/19 147 lb 6.4 oz (66.9 kg)  05/24/19 144 lb 6.4 oz (65.5 kg)  02/22/19 147 lb (66.7 kg)    Physical Exam Vitals reviewed.  Constitutional:      Appearance: She is  well-developed.  Eyes:     Conjunctiva/sclera: Conjunctivae normal.  Cardiovascular:     Rate and Rhythm: Normal rate and regular rhythm.     Pulses: Normal pulses.     Heart sounds: Normal heart sounds.     Comments: Wearing compression stockings bilaterally Pulmonary:     Effort: Pulmonary effort is normal.     Breath sounds: Normal breath sounds. No wheezing, rhonchi or rales.  Musculoskeletal:     Right lower leg: No edema.     Left lower leg: No edema.  Skin:    General: Skin is warm and dry.  Neurological:     Mental Status: She is alert.  Psychiatric:        Speech: Speech normal.        Behavior: Behavior normal.        Thought Content: Thought content normal.        Assessment & Plan:   Problem List Items Addressed This Visit      Cardiovascular and Mediastinum   Essential hypertension    Well-controlled overall, I am pleased with averages at home.  Blood pressure improved as patient rested in exam room today.  I emphasized that she should take amlodipine 2.5 mg blood pressures greater than 145/90.  She will continue to do so.  She will call me with any concerns        Digestive   Lymphocytic colitis    Chronic.  Will follow        Other   Hyperlipidemia, unspecified    Patient was agreeable to trial of Zetia 10 mg.  Will repeat lipid panel at follow-up      Trouble getting to sleep    Chronic, discussed with patient increasing exercise and  trialing melatonin.  She will do both of these and let me know how she is doing.  Plan to discuss trazodone at follow-up as well.          I am having Mia Perez maintain her budesonide, diphenoxylate-atropine, Lumigan, aspirin, Multiple Vitamins-Minerals (PRESERVISION AREDS 2 PO), simethicone, Vitamin B-12, Ca Carbonate-Mag Hydroxide (ROLAIDS PO), letrozole, metoprolol succinate, losartan, amLODipine, ezetimibe, amLODipine, dorzolamidel-timolol, and Calcium Carbonate-Vit D-Min (CALCIUM 1200 PO).   No orders of the defined types were placed in this encounter.   Return precautions given.   Risks, benefits, and alternatives of the medications and treatment plan prescribed today were discussed, and patient expressed understanding.   Education regarding symptom management and diagnosis given to patient on AVS.  Continue to follow with Burnard Hawthorne, FNP for routine health maintenance.   Mia Perez and I agreed with plan.   Mable Paris, FNP

## 2019-08-30 NOTE — Assessment & Plan Note (Signed)
Well-controlled overall, I am pleased with averages at home.  Blood pressure improved as patient rested in exam room today.  I emphasized that she should take amlodipine 2.5 mg blood pressures greater than 145/90.  She will continue to do so.  She will call me with any concerns

## 2019-08-30 NOTE — Assessment & Plan Note (Signed)
Patient was agreeable to trial of Zetia 10 mg.  Will repeat lipid panel at follow-up

## 2019-08-30 NOTE — Assessment & Plan Note (Signed)
Chronic, discussed with patient increasing exercise and trialing melatonin.  She will do both of these and let me know how she is doing.  Plan to discuss trazodone at follow-up as well.

## 2019-08-30 NOTE — Patient Instructions (Signed)
Epic, internet down Unable to print avs

## 2019-10-02 ENCOUNTER — Other Ambulatory Visit: Payer: Self-pay | Admitting: Nephrology

## 2019-10-02 DIAGNOSIS — Q6102 Congenital multiple renal cysts: Secondary | ICD-10-CM

## 2019-10-13 ENCOUNTER — Ambulatory Visit
Admission: RE | Admit: 2019-10-13 | Discharge: 2019-10-13 | Disposition: A | Discharge status: Home / Self Care | Payer: Medicare PPO | Source: Ambulatory Visit | Attending: Nephrology | Admitting: Nephrology

## 2019-10-13 ENCOUNTER — Other Ambulatory Visit: Payer: Self-pay

## 2019-10-13 DIAGNOSIS — Q6102 Congenital multiple renal cysts: Secondary | ICD-10-CM | POA: Diagnosis present

## 2019-11-16 ENCOUNTER — Ambulatory Visit: Payer: Medicare Other

## 2019-11-30 ENCOUNTER — Ambulatory Visit (INDEPENDENT_AMBULATORY_CARE_PROVIDER_SITE_OTHER): Payer: Medicare PPO

## 2019-11-30 VITALS — Ht 67.01 in | Wt 145.0 lb

## 2019-11-30 DIAGNOSIS — Z0000 Encounter for general adult medical examination without abnormal findings: Secondary | ICD-10-CM | POA: Diagnosis not present

## 2019-11-30 NOTE — Progress Notes (Addendum)
Subjective:   Mia Perez is a 86 y.o. female who presents for Medicare Annual (Subsequent) preventive examination.  Review of Systems    No ROS.  Medicare Wellness Virtual Visit.  Cardiac Risk Factors include: advanced age (>81men, >45 women);hypertension     Objective:    Today's Vitals   11/30/19 1130  Weight: 145 lb (65.8 kg)  Height: 5' 7.01" (1.702 m)   Body mass index is 22.7 kg/m.  Advanced Directives 11/30/2019 01/23/2019 11/15/2018 01/17/2018 01/25/2017 07/15/2016 02/20/2016  Does Patient Have a Medical Advance Directive? Yes No No No No No Yes  Type of Paramedic of [place];Living will - - - - - -  Does patient want to make changes to medical advance directive? No - Patient declined - - - - - No - Patient declined  Copy of [place] in Chart? No - copy requested - - - - - -  Would patient like information on creating a medical advance directive? - No - Patient declined No - Patient declined No - Patient declined No - Patient declined No - Patient declined -    Current Medications (verified) Outpatient Encounter Medications as of 11/30/2019  Medication Sig  . amLODipine (NORVASC) 2.5 MG tablet Take 1 tablet (2.5 mg total) by mouth daily. Take another 2.5mg  PO in the evening if blood pressure > 145/90.  Marland Kitchen amLODipine (NORVASC) 5 MG tablet Take 1 tablet (5 mg total) by mouth daily.  Marland Kitchen aspirin 325 MG tablet Take 325 mg by mouth daily.  . budesonide (ENTOCORT EC) 3 MG 24 hr capsule Take 1 capsule (3 mg total) by mouth daily.  . Ca Carbonate-Mag Hydroxide (ROLAIDS PO) Take 1 tablet by mouth as needed.  . Calcium Carbonate-Vit D-Min (CALCIUM 1200 PO) Take 1 tablet by mouth daily.  . Cyanocobalamin (VITAMIN B-12) 5000 MCG TBDP Take 5,000 mcg by mouth every other day.   . diphenoxylate-atropine (LOMOTIL) 2.5-0.025 MG tablet TAKE 1 TABLET BY MOUTH 4 TIMES A DAY AS NEEDED FOR LOOSE STOOLS (Patient taking differently: Take 2-3 tablets  daily as needed for loose stools)  . dorzolamidel-timolol (COSOPT) 22.3-6.8 MG/ML SOLN ophthalmic solution Place 1 drop into both eyes 2 (two) times daily. Take one drop in both eyes in the morning & evening before bed.  . ezetimibe (ZETIA) 10 MG tablet Take 1 tablet (10 mg total) by mouth daily. (Patient not taking: Reported on 05/24/2019)  . letrozole (FEMARA) 2.5 MG tablet TAKE 1 TABLET BY MOUTH DAILY  . losartan (COZAAR) 100 MG tablet Take 1 tablet (100 mg total) by mouth every evening.  Marland Kitchen LUMIGAN 0.01 % SOLN Place 1 drop into both eyes at bedtime.   . metoprolol succinate (TOPROL-XL) 100 MG 24 hr tablet TAKE 1 TABLET (100 MG TOTAL) BY MOUTH DAILY. TAKE WITH OR IMMEDIATELY FOLLOWING A MEAL. APPT NEEDED  . Multiple Vitamins-Minerals (PRESERVISION AREDS 2 PO) Take 1 tablet by mouth 2 (two) times daily.   . simethicone (MYLICON) 540 MG chewable tablet Chew 125 mg by mouth every morning.    No facility-administered encounter medications on file as of 11/30/2019.    Allergies (verified) Pravachol [pravastatin] and Penicillins   History: Past Medical History:  Diagnosis Date  . Anemia    H/O  . Arthritis   . Breast cancer ([facility]) 02/10/1996   left, lumpectomy and radiation  . Breast cancer ([facility]) 2017   right  . DVT (deep venous thrombosis) ([facility]) 11/2014  . GERD (gastroesophageal reflux disease)   .  Hypertension   . Lymphocytic colitis   . Personal history of radiation therapy 1998, 2017   BREAST CA  . Ulcerative colitis ([facility]) 2014  . Varicose veins    Past Surgical History:  Procedure Laterality Date  . BREAST EXCISIONAL BIOPSY Right 2014   NEG  . BREAST LUMPECTOMY Left 1998   radiation  . BREAST LUMPECTOMY Right 2017  . BREAST LUMPECTOMY WITH SENTINEL LYMPH NODE BIOPSY Right 12/30/2015   8 mm, T1b, N0; RUOQ, ER 95%; PR: 80 %, Her 2 neu not overexpressed.Mammosite Radiation.  Marland Kitchen BREAST SURGERY Right 2014   Microcalcifications excised with wire localization/ Dr Bary Castilla  .  COLONOSCOPY  2012   Dr Earlean Shawl  . DILATION AND CURETTAGE OF UTERUS    . HERNIA REPAIR    . HYSTEROSCOPY    . NECK SURGERY    . PERIPHERAL VASCULAR CATHETERIZATION N/A 12/13/2014   Procedure: IVC Filter Insertion;  Surgeon: Algernon Huxley, MD;  Location: [facility];  Service: Cardiovascular;  Laterality: N/A;  . PERIPHERAL VASCULAR CATHETERIZATION  12/13/2014   Procedure: Lower Extremity Intervention;  Surgeon: Algernon Huxley, MD;  Location: [facility];  Service: Cardiovascular;;  . PERIPHERAL VASCULAR CATHETERIZATION N/A 02/20/2015   Procedure: IVC Filter Removal;  Surgeon: Algernon Huxley, MD;  Location: [facility];  Service: Cardiovascular;  Laterality: N/A;   Family History  Problem Relation Age of Onset  . Heart disease Mother   . Hypertension Mother   . Ovarian cancer Mother 44  . Hypertension Sister   . Hypertension Brother   . Ovarian cancer Paternal Aunt 85  . Breast cancer Neg Hx    Social History   Socioeconomic History  . Marital status: Widowed    Spouse name: Not on file  . Number of children: 1  . Years of education: Not on file  . Highest education level: Not on file  Occupational History  . Occupation: Retired   Tobacco Use  . Smoking status: Never Smoker  . Smokeless tobacco: Never Used  Substance and Sexual Activity  . Alcohol use: No  . Drug use: No  . Sexual activity: Never  Other Topics Concern  . Not on file  Social History Narrative   Lives in [place]. Husband passed away 36. Mia Perez, daughter.      Work - [employer] 13 years, then [employer] - regular.      Social Determinants of Health   Financial Resource Strain: Low Risk   . Difficulty of Paying Living Expenses: Not hard at all  Food Insecurity: No Food Insecurity  . Worried About Charity fundraiser in the Last Year: Never true  . Ran Out of Food in the Last Year: Never true  Transportation Needs: No Transportation Needs    . Lack of Transportation (Medical): No  . Lack of Transportation (Non-Medical): No  Physical Activity:   . Days of Exercise per Week: Not on file  . Minutes of Exercise per Session: Not on file  Stress: No Stress Concern Present  . Feeling of Stress : Not at all  Social Connections: Unknown  . Frequency of Communication with Friends and Family: More than three times a week  . Frequency of Social Gatherings with Friends and Family: Not on file  . Attends Religious Services: More than 4 times per year  . Active Member of Clubs or Organizations: Yes  .  Attends Archivist Meetings: More than 4 times per year  . Marital Status: Not on file    Tobacco Counseling Counseling given: Not Answered   Clinical Intake:  Pre-visit preparation completed: Yes           How often do you need to have someone help you when you read instructions, pamphlets, or other written materials from your doctor or pharmacy?: 1 - Never      Activities of Daily Living In your present state of health, do you have any difficulty performing the following activities: 11/30/2019  Hearing? N  Vision? N  Difficulty concentrating or making decisions? N  Walking or climbing stairs? N  Dressing or bathing? N  Doing errands, shopping? N  Preparing Food and eating ? N  Using the Toilet? N  In the past six months, have you accidently leaked urine? N  Do you have problems with loss of bowel control? N  Managing your Medications? N  Managing your Finances? N  Housekeeping or managing your Housekeeping? N  Some recent data might be hidden    Patient Care Team: Burnard Hawthorne, FNP as PCP - General (Family Medicine) Bary Castilla, Forest Gleason, MD (General Surgery) Bary Castilla Forest Gleason, MD (General Surgery) Coral Spikes, DO as Consulting Physician (Family Medicine)  Indicate any recent Medical Services you may have received from other than [facility] providers in the past year (date may be approximate).      Assessment:   This is a routine wellness examination for Mia Perez.  I connected with [place] today by telephone and verified that I am speaking with the correct person using two identifiers. Location patient: home Location provider: work Persons participating in the virtual visit: patient, Marine scientist.    I discussed the limitations, risks, security and privacy concerns of performing an evaluation and management service by telephone and the availability of in person appointments. The patient expressed understanding and verbally consented to this telephonic visit.    Interactive audio and video telecommunications were attempted between this provider and patient, however failed, due to patient having technical difficulties OR patient did not have access to video capability.  We continued and completed visit with audio only.  Some vital signs may be absent or patient reported.   Hearing/Vision screen  Hearing Screening   125Hz  250Hz  500Hz  1000Hz  2000Hz  3000Hz  4000Hz  6000Hz  8000Hz   Right ear:           Left ear:           Comments: Patient is able to hear conversational tones without difficulty. No issues reported.  Vision Screening Comments: Wears corrective lenses  Visual acuity not assessed, virtual visit.   Dietary issues and exercise activities discussed:   Healthy diet Good water intake  Goals      Patient Stated   .  Maintain Healthy Lifestyle (pt-stated)      Stay hydrated Stay active Healthy diet      Depression Screen PHQ 2/9 Scores 11/30/2019 08/30/2019 11/15/2018 10/05/2018 01/30/2016 01/06/2016 01/30/2015  PHQ - 2 Score 0 0 0 0 0 0 0    Fall Risk Fall Risk  11/30/2019 08/30/2019 11/15/2018 10/05/2018 12/30/2017  Falls in the past year? 0 0 0 0 0  Number falls in past yr: 0 0 - - -  Injury with Fall? - 0 - - -  Risk for fall due to : - - - - -  Follow up Falls evaluation completed Falls evaluation completed - Falls evaluation completed -  Handrails in use when  climbing stairs? Yes Home free of loose throw rugs in walkways, pet beds, electrical cords, etc? Yes  Adequate lighting in your home to reduce risk of falls? Yes   ASSISTIVE DEVICES UTILIZED TO PREVENT FALLS: Life alert? Yes  Use of a cane, walker or w/c? No   TIMED UP AND GO: Was the test performed? No . Virtual visit.   Cognitive Function: Patient is alert and oriented x3.  Denies difficulty, making decisions, memory loss.  Enjoys crafts and playing the piano for brain health.   MMSE - Mini Mental State Exam 01/30/2015  Orientation to time 5  Orientation to Place 5  Registration 3  Attention/ Calculation 5  Recall 3  Language- name 2 objects 2  Language- repeat 1  Language- follow 3 step command 3  Language- read & follow direction 1  Write a sentence 1  Copy design 1  Total score 30     6CIT Screen 11/30/2019 11/15/2018 01/30/2016  What Year? 0 points 0 points 0 points  What month? 0 points 0 points 0 points  What time? 0 points 0 points 0 points  Count back from 20 - 0 points 0 points  Months in reverse 0 points 0 points 0 points  Repeat phrase - 0 points -  Total Score - 0 -    Immunizations Immunization History  Administered Date(s) Administered  . Fluad Quad(high Dose 65+) 10/10/2018  . Influenza Split 02/23/2012, 10/19/2015  . Influenza, High Dose Seasonal PF 10/06/2017, 10/17/2019  . Influenza,inj,Quad PF,6+ Mos 03/06/2013, 12/12/2013, 10/16/2014  . Influenza-Unspecified 10/13/2016  . PFIZER SARS-COV-2 Vaccination 02/24/2019, 03/17/2019  . Pneumococcal Conjugate-13 03/06/2013  . Pneumococcal Polysaccharide-23 08/05/2010  . Tdap 08/04/2009   TDAP status: Due, Education has been provided regarding the importance of this vaccine. Advised may receive this vaccine at local pharmacy or Health Dept. Aware to provide a copy of the vaccination record if obtained from local pharmacy or Health Dept. Verbalized acceptance and understanding. Deferred.    Health  Maintenance Health Maintenance  Topic Date Due  . TETANUS/TDAP  11/29/2020 (Originally 08/05/2019)  . INFLUENZA VACCINE  Completed  . DEXA SCAN  Completed  . COVID-19 Vaccine  Completed  . PNA vac Low Risk Adult  Completed   Dental Screening: Recommended annual dental exams for proper oral hygiene.  Community Resource Referral / Chronic Care Management: CRR required this visit?  No   CCM required this visit?  No      Plan:   Keep all routine maintenance appointments.   Follow up 12/06/19 @ 2:00  I have personally reviewed and noted the following in the patient's chart:   . Medical and social history . Use of alcohol, tobacco or illicit drugs  . Current medications and supplements . Functional ability and status . Nutritional status . Physical activity . Advanced directives . List of other physicians . Hospitalizations, surgeries, and ER visits in previous 12 months . Vitals . Screenings to include cognitive, depression, and falls . Referrals and appointments  In addition, I have reviewed and discussed with patient certain preventive protocols, quality metrics, and best practice recommendations. A written personalized care plan for preventive services as well as general preventive health recommendations were provided to patient via mail.     Varney Biles, LPN   [id number]    Agree Dr. Olivia Mackie McLean-Scocuzza

## 2019-11-30 NOTE — Patient Instructions (Addendum)
Mia Perez , Thank you for taking time to come for your Medicare Wellness Visit. I appreciate your ongoing commitment to your health goals. Please review the following plan we discussed and let me know if I can assist you in the future.   These are the goals we discussed: Goals      Patient Stated   .  Maintain Healthy Lifestyle (pt-stated)      Stay hydrated Stay active Healthy diet       This is a list of the screening recommended for you and due dates:  Health Maintenance  Topic Date Due  . Tetanus Vaccine  11/29/2020*  . Flu Shot  Completed  . DEXA scan (bone density measurement)  Completed  . COVID-19 Vaccine  Completed  . Pneumonia vaccines  Completed  *Topic was postponed. The date shown is not the original due date.    Immunizations Immunization History  Administered Date(s) Administered  . Fluad Quad(high Dose 65+) 10/10/2018  . Influenza Split 02/23/2012, 10/19/2015  . Influenza, High Dose Seasonal PF 10/06/2017, 10/17/2019  . Influenza,inj,Quad PF,6+ Mos 03/06/2013, 12/12/2013, 10/16/2014  . Influenza-Unspecified 10/13/2016  . PFIZER SARS-COV-2 Vaccination 02/24/2019, 03/17/2019  . Pneumococcal Conjugate-13 03/06/2013  . Pneumococcal Polysaccharide-23 08/05/2010  . Tdap 08/04/2009   Keep all routine maintenance appointments.   Follow up 12/06/19 @ 2:00  Advanced directives: End of life planning; Advance aging; Advanced directives discussed.  Copy of current HCPOA/Living Will requested.    Conditions/risks identified: none new.  Follow up in one year for your annual wellness visit.   Preventive Care 84 Years and Older, Female Preventive care refers to lifestyle choices and visits with your health care provider that can promote health and wellness. What does preventive care include?  A yearly physical exam. This is also called an annual well check.  Dental exams once or twice a year.  Routine eye exams. Ask your health care provider how often you  should have your eyes checked.  Personal lifestyle choices, including:  Daily care of your teeth and gums.  Regular physical activity.  Eating a healthy diet.  Avoiding tobacco and drug use.  Limiting alcohol use.  Practicing safe sex.  Taking low-dose aspirin every day.  Taking vitamin and mineral supplements as recommended by your health care provider. What happens during an annual well check? The services and screenings done by your health care provider during your annual well check will depend on your age, overall health, lifestyle risk factors, and family history of disease. Counseling  Your health care provider may ask you questions about your:  Alcohol use.  Tobacco use.  Drug use.  Emotional well-being.  Home and relationship well-being.  Sexual activity.  Eating habits.  History of falls.  Memory and ability to understand (cognition).  Work and work Statistician.  Reproductive health. Screening  You may have the following tests or measurements:  Height, weight, and BMI.  Blood pressure.  Lipid and cholesterol levels. These may be checked every 5 years, or more frequently if you are over 20 years old.  Skin check.  Lung cancer screening. You may have this screening every year starting at age 58 if you have a 30-pack-year history of smoking and currently smoke or have quit within the past 15 years.  Fecal occult blood test (FOBT) of the stool. You may have this test every year starting at age 11.  Flexible sigmoidoscopy or colonoscopy. You may have a sigmoidoscopy every 5 years or a colonoscopy every 10 years  starting at age 17.  Hepatitis C blood test.  Hepatitis B blood test.  Sexually transmitted disease (STD) testing.  Diabetes screening. This is done by checking your blood sugar (glucose) after you have not eaten for a while (fasting). You may have this done every 1-3 years.  Bone density scan. This is done to screen for osteoporosis.  You may have this done starting at age 54.  Mammogram. This may be done every 1-2 years. Talk to your health care provider about how often you should have regular mammograms. Talk with your health care provider about your test results, treatment options, and if necessary, the need for more tests. Vaccines  Your health care provider may recommend certain vaccines, such as:  Influenza vaccine. This is recommended every year.  Tetanus, diphtheria, and acellular pertussis (Tdap, Td) vaccine. You may need a Td booster every 10 years.  Zoster vaccine. You may need this after age 76.  Pneumococcal 13-valent conjugate (PCV13) vaccine. One dose is recommended after age 65.  Pneumococcal polysaccharide (PPSV23) vaccine. One dose is recommended after age 36. Talk to your health care provider about which screenings and vaccines you need and how often you need them. This information is not intended to replace advice given to you by your health care provider. Make sure you discuss any questions you have with your health care provider. Document Released: 02/22/2015 Document Revised: 10/16/2015 Document Reviewed: 11/27/2014 Elsevier Interactive Patient Education  2017 [employer] Prevention in the Home Falls can cause injuries. They can happen to people of all ages. There are many things you can do to make your home safe and to help prevent falls. What can I do on the outside of my home?  Regularly fix the edges of walkways and driveways and fix any cracks.  Remove anything that might make you trip as you walk through a door, such as a raised step or threshold.  Trim any bushes or trees on the path to your home.  Use bright outdoor lighting.  Clear any walking paths of anything that might make someone trip, such as rocks or tools.  Regularly check to see if handrails are loose or broken. Make sure that both sides of any steps have handrails.  Any raised decks and porches should have  guardrails on the edges.  Have any leaves, snow, or ice cleared regularly.  Use sand or salt on walking paths during winter.  Clean up any spills in your garage right away. This includes oil or grease spills. What can I do in the bathroom?  Use night lights.  Install grab bars by the toilet and in the tub and shower. Do not use towel bars as grab bars.  Use non-skid mats or decals in the tub or shower.  If you need to sit down in the shower, use a plastic, non-slip stool.  Keep the floor dry. Clean up any water that spills on the floor as soon as it happens.  Remove soap buildup in the tub or shower regularly.  Attach bath mats securely with double-sided non-slip rug tape.  Do not have throw rugs and other things on the floor that can make you trip. What can I do in the bedroom?  Use night lights.  Make sure that you have a light by your bed that is easy to reach.  Do not use any sheets or blankets that are too big for your bed. They should not hang down onto the floor.  Have a  firm chair that has side arms. You can use this for support while you get dressed.  Do not have throw rugs and other things on the floor that can make you trip. What can I do in the kitchen?  Clean up any spills right away.  Avoid walking on wet floors.  Keep items that you use a lot in easy-to-reach places.  If you need to reach something above you, use a strong step stool that has a grab bar.  Keep electrical cords out of the way.  Do not use floor polish or wax that makes floors slippery. If you must use wax, use non-skid floor wax.  Do not have throw rugs and other things on the floor that can make you trip. What can I do with my stairs?  Do not leave any items on the stairs.  Make sure that there are handrails on both sides of the stairs and use them. Fix handrails that are broken or loose. Make sure that handrails are as long as the stairways.  Check any carpeting to make sure that  it is firmly attached to the stairs. Fix any carpet that is loose or worn.  Avoid having throw rugs at the top or bottom of the stairs. If you do have throw rugs, attach them to the floor with carpet tape.  Make sure that you have a light switch at the top of the stairs and the bottom of the stairs. If you do not have them, ask someone to add them for you. What else can I do to help prevent falls?  Wear shoes that:  Do not have high heels.  Have rubber bottoms.  Are comfortable and fit you well.  Are closed at the toe. Do not wear sandals.  If you use a stepladder:  Make sure that it is fully opened. Do not climb a closed stepladder.  Make sure that both sides of the stepladder are locked into place.  Ask someone to hold it for you, if possible.  Clearly mark and make sure that you can see:  Any grab bars or handrails.  First and last steps.  Where the edge of each step is.  Use tools that help you move around (mobility aids) if they are needed. These include:  Canes.  Walkers.  Scooters.  Crutches.  Turn on the lights when you go into a dark area. Replace any light bulbs as soon as they burn out.  Set up your furniture so you have a clear path. Avoid moving your furniture around.  If any of your floors are uneven, fix them.  If there are any pets around you, be aware of where they are.  Review your medicines with your doctor. Some medicines can make you feel dizzy. This can increase your chance of falling. Ask your doctor what other things that you can do to help prevent falls. This information is not intended to replace advice given to you by your health care provider. Make sure you discuss any questions you have with your health care provider. Document Released: 11/22/2008 Document Revised: 07/04/2015 Document Reviewed: 03/02/2014 Elsevier Interactive Patient Education  2017 [employer].

## 2019-12-04 ENCOUNTER — Other Ambulatory Visit: Payer: Self-pay

## 2019-12-06 ENCOUNTER — Ambulatory Visit: Payer: Medicare PPO | Admitting: Family

## 2019-12-06 ENCOUNTER — Encounter: Payer: Self-pay | Admitting: Family

## 2019-12-06 ENCOUNTER — Other Ambulatory Visit: Payer: Self-pay

## 2019-12-06 VITALS — BP 122/70 | HR 58 | Temp 98.4°F | Ht 67.0 in | Wt 141.0 lb

## 2019-12-06 DIAGNOSIS — C50912 Malignant neoplasm of unspecified site of left female breast: Secondary | ICD-10-CM

## 2019-12-06 DIAGNOSIS — R7303 Prediabetes: Secondary | ICD-10-CM | POA: Diagnosis not present

## 2019-12-06 DIAGNOSIS — C50911 Malignant neoplasm of unspecified site of right female breast: Secondary | ICD-10-CM | POA: Diagnosis not present

## 2019-12-06 DIAGNOSIS — I10 Essential (primary) hypertension: Secondary | ICD-10-CM | POA: Diagnosis not present

## 2019-12-06 DIAGNOSIS — I82511 Chronic embolism and thrombosis of right femoral vein: Secondary | ICD-10-CM | POA: Diagnosis not present

## 2019-12-06 NOTE — Assessment & Plan Note (Addendum)
Patient will schedule diagnostic mammogram; order placed today. She will also schedule follow with Dr Bary Castilla whom prescribes her femara for on going surveillance.

## 2019-12-06 NOTE — Progress Notes (Signed)
Subjective:    [name]: Mia Perez, female    DOB: 04/28/1933, 86 y.o.   MRN: [id number]  CC: Mia Perez is a 86 y.o. female who presents today for follow up.   HPI: Feels well today No complaints  HTN- taking 7.5mg  amlodipine daily. Takes 2.5mg  amlodipine if BP 140/80.  Denies exertional chest pain or pressure, numbness or tingling radiating to left arm or jaw, palpitations, dizziness, frequent headaches, changes in vision, or shortness of breath.   H/o one right DVT 11/2014 - continues to stay on asa 325mg . No falls. No bleeding. No leg swelling, pain in calves with walking or peripheral neuropathy.  Notes bruises easily. She also note discoloration in bilateral legs when sitting, resolves with elevation.  No GIB.  Xarelto prescription in chart 12/2014. Per patient, xarelto  not approved by insurance and not sure how long she took medication.  S/p IVC filter and removal in 2016  H/o of lymphocytic colitis - follows Dr Earlean Shawl. Compliant with entocort and lomotil.    H/o right and left breast cancer- follows with Dr Baruch Gouty and Dr Bary Castilla. Compliant with femara  CKD- follows with Dr [name]    HISTORY:  Past Medical History:  Diagnosis Date  . Anemia    H/O  . Arthritis   . Breast cancer ([place]) 02/10/1996   left, lumpectomy and radiation  . Breast cancer ([place]) 2017   right  . DVT (deep venous thrombosis) ([place]) 11/2014  . GERD (gastroesophageal reflux disease)   . Hypertension   . Lymphocytic colitis   . Personal history of radiation therapy 1998, 2017   BREAST CA  . Ulcerative colitis ([place]) 2014  . Varicose veins    Past Surgical History:  Procedure Laterality Date  . BREAST EXCISIONAL BIOPSY Right 2014   NEG  . BREAST LUMPECTOMY Left 1998   radiation  . BREAST LUMPECTOMY Right 2017  . BREAST LUMPECTOMY WITH SENTINEL LYMPH NODE BIOPSY Right 12/30/2015   8 mm, T1b, N0; RUOQ, ER 95%; PR: 80 %, Her 2 neu not overexpressed.Mammosite Radiation.  Marland Kitchen BREAST  SURGERY Right 2014   Microcalcifications excised with wire localization/ Dr Bary Castilla  . COLONOSCOPY  2012   Dr Earlean Shawl  . DILATION AND CURETTAGE OF UTERUS    . HERNIA REPAIR    . HYSTEROSCOPY    . NECK SURGERY    . PERIPHERAL VASCULAR CATHETERIZATION N/A 12/13/2014   Procedure: IVC Filter Insertion;  Surgeon: Algernon Huxley, MD;  Location: [facility];  Service: Cardiovascular;  Laterality: N/A;  . PERIPHERAL VASCULAR CATHETERIZATION  12/13/2014   Procedure: Lower Extremity Intervention;  Surgeon: Algernon Huxley, MD;  Location: [facility];  Service: Cardiovascular;;  . PERIPHERAL VASCULAR CATHETERIZATION N/A 02/20/2015   Procedure: IVC Filter Removal;  Surgeon: Algernon Huxley, MD;  Location: [facility];  Service: Cardiovascular;  Laterality: N/A;   Family History  Problem Relation Age of Onset  . Heart disease Mother   . Hypertension Mother   . Ovarian cancer Mother 24  . Hypertension Sister   . Hypertension Brother   . Ovarian cancer Paternal Aunt 85  . Breast cancer Neg Hx     Allergies: Pravachol [pravastatin] and Penicillins Current Outpatient Medications on File Prior to Visit  Medication Sig Dispense Refill  . amLODipine (NORVASC) 2.5 MG tablet Take 1 tablet (2.5 mg total) by mouth daily. Take another 2.5mg  PO in the evening if blood pressure > 145/90. 90 tablet 3  .  amLODipine (NORVASC) 5 MG tablet Take 1 tablet (5 mg total) by mouth daily. 90 tablet 3  . aspirin 325 MG tablet Take 325 mg by mouth daily.    . calcium gluconate 500 MG tablet Take 1 tablet by mouth daily.    . cholecalciferol (VITAMIN D) 25 MCG (1000 UNIT) tablet Take 1,000 Units by mouth daily.    . Cyanocobalamin (VITAMIN B-12) 5000 MCG TBDP Take 5,000 mcg by mouth every other day.     . diphenoxylate-atropine (LOMOTIL) 2.5-0.025 MG tablet TAKE 1 TABLET BY MOUTH 4 TIMES A DAY AS NEEDED FOR LOOSE STOOLS (Patient taking differently: Take 2-3 tablets daily as needed for loose stools) 360 tablet 0   . dorzolamidel-timolol (COSOPT) 22.3-6.8 MG/ML SOLN ophthalmic solution Place 1 drop into both eyes 2 (two) times daily. Take one drop in both eyes in the morning & evening before bed.    . letrozole (FEMARA) 2.5 MG tablet TAKE 1 TABLET BY MOUTH DAILY 90 tablet 4  . losartan (COZAAR) 100 MG tablet Take 1 tablet (100 mg total) by mouth every evening. 90 tablet 3  . LUMIGAN 0.01 % SOLN Place 1 drop into both eyes at bedtime.     . metoprolol succinate (TOPROL-XL) 100 MG 24 hr tablet TAKE 1 TABLET (100 MG TOTAL) BY MOUTH DAILY. TAKE WITH OR IMMEDIATELY FOLLOWING A MEAL. APPT NEEDED 90 tablet 3  . Multiple Vitamins-Minerals (PRESERVISION AREDS 2 PO) Take 1 tablet by mouth 2 (two) times daily.     . simethicone (MYLICON) 967 MG chewable tablet Chew 125 mg by mouth every morning.     . budesonide (ENTOCORT EC) 3 MG 24 hr capsule Take 1 capsule (3 mg total) by mouth daily. (Patient not taking: Reported on 12/06/2019) 90 capsule 3   No current facility-administered medications on file prior to visit.    Social History   Tobacco Use  . Smoking status: Never Smoker  . Smokeless tobacco: Never Used  Substance Use Topics  . Alcohol use: No  . Drug use: No    Review of Systems  Constitutional: Negative for chills and fever.  Respiratory: Negative for cough.   Cardiovascular: Negative for chest pain, palpitations and leg swelling.  Gastrointestinal: Negative for nausea and vomiting.  Neurological: Negative for dizziness and numbness.      Objective:    BP 122/70   Pulse (!) 58   Temp 98.4 F (36.9 C)   Ht 5\' 7"  (1.702 m)   Wt 141 lb (64 kg)   SpO2 97%   BMI 22.08 kg/m  BP Readings from Last 3 Encounters:  12/06/19 122/70  08/30/19 140/78  05/24/19 118/74  HR 7/21/1 65  Wt Readings from Last 3 Encounters:  12/06/19 141 lb (64 kg)  11/30/19 145 lb (65.8 kg)  08/30/19 147 lb 6.4 oz (66.9 kg)    Physical Exam Vitals reviewed.  Constitutional:      Appearance: She is  well-developed.  Eyes:     Conjunctiva/sclera: Conjunctivae normal.  Cardiovascular:     Rate and Rhythm: Normal rate and regular rhythm.     Pulses: Normal pulses.     Heart sounds: Normal heart sounds.     Comments: No LE edema, palpable cords or masses. No erythema or increased warmth. No asymmetry in calf size when compared bilaterally Wearing compression stockings. No discoloration or varicosities noted. LE warm and palpable pedal pulses.  Pulmonary:     Effort: Pulmonary effort is normal.     Breath sounds:  Normal breath sounds. No wheezing, rhonchi or rales.  Musculoskeletal:     Right lower leg: No edema.     Left lower leg: No edema.  Skin:    General: Skin is warm and dry.  Neurological:     Mental Status: She is alert.  Psychiatric:        Speech: Speech normal.        Behavior: Behavior normal.        Thought Content: Thought content normal.        Assessment & Plan:   Problem List Items Addressed This Visit      Cardiovascular and Mediastinum   DVT (deep venous thrombosis) (HCC)    H/o DVT. She continues to be on ASA 325mg . In setting of complaint of discoloration of bilateral lower extremities, patient and I agreed that we will consult Dr Lucky Cowboy for advise in regards to ASA 325mg  continuation versus another agent or discontinuation. Referral placed and will follow.       Relevant Orders   Ambulatory referral to Vascular Surgery   Essential hypertension    Stable. Continue losartan 100mg , metoprolol 100mg  XR, amlodipine 7.5 mg daily with additional 2.5mg  prn BP > 140/80.         Other   Breast cancer [facility]) - Primary    Patient will schedule diagnostic mammogram; order placed today. She will also schedule follow with Dr Bary Castilla whom prescribes her femara for on going surveillance.       Relevant Orders   MM DIAG BREAST TOMO BILATERAL   Prediabetes   Relevant Orders   Hemoglobin A1c   Comprehensive metabolic panel   Lipid panel       I have  discontinued Anaiah B. Honeycutt's Ca Carbonate-Mag Hydroxide (ROLAIDS PO), ezetimibe, and Calcium Carbonate-Vit D-Min (CALCIUM 1200 PO). I am also having her maintain her budesonide, diphenoxylate-atropine, Lumigan, aspirin, Multiple Vitamins-Minerals (PRESERVISION AREDS 2 PO), simethicone, Vitamin B-12, letrozole, metoprolol succinate, losartan, amLODipine, amLODipine, dorzolamidel-timolol, cholecalciferol, and calcium gluconate.   No orders of the defined types were placed in this encounter.   Return precautions given.   Risks, benefits, and alternatives of the medications and treatment plan prescribed today were discussed, and patient expressed understanding.   Education regarding symptom management and diagnosis given to patient on AVS.  Continue to follow with Burnard Hawthorne, FNP for routine health maintenance.   Mia Perez and I agreed with plan.   Mable Paris, FNP

## 2019-12-06 NOTE — Assessment & Plan Note (Signed)
Stable. Continue losartan 100mg , metoprolol 100mg  XR, amlodipine 7.5 mg daily with additional 2.5mg  prn BP > 140/80.

## 2019-12-06 NOTE — Assessment & Plan Note (Signed)
H/o DVT. She continues to be on ASA 325mg . In setting of complaint of discoloration of bilateral lower extremities, patient and I agreed that we will consult Dr Lucky Cowboy for advise in regards to ASA 325mg  continuation versus another agent or discontinuation. Referral placed and will follow.

## 2019-12-06 NOTE — Patient Instructions (Addendum)
Please call Dr Job Founds for follow up in regards to femara.  He is now with [facility]. Let me know if any issues in getting an appointment.   [facility] [place],  [id number] Office: [phone]  Please call  and schedule your 3D mammogram as discussed.   [facility]  [place], [place]  Referral to vascular, Dr Lucky Cowboy Let us know if you dont hear back within a week in regards to an appointment being scheduled.

## 2019-12-12 ENCOUNTER — Other Ambulatory Visit: Payer: Self-pay | Admitting: General Surgery

## 2019-12-12 DIAGNOSIS — C50919 Malignant neoplasm of unspecified site of unspecified female breast: Secondary | ICD-10-CM

## 2019-12-12 NOTE — Progress Notes (Signed)
[id number]

## 2019-12-18 ENCOUNTER — Other Ambulatory Visit: Payer: Self-pay

## 2019-12-18 ENCOUNTER — Encounter: Payer: Self-pay | Admitting: Family

## 2019-12-18 ENCOUNTER — Other Ambulatory Visit (INDEPENDENT_AMBULATORY_CARE_PROVIDER_SITE_OTHER): Payer: Medicare PPO

## 2019-12-18 DIAGNOSIS — R7303 Prediabetes: Secondary | ICD-10-CM | POA: Diagnosis not present

## 2019-12-18 DIAGNOSIS — E876 Hypokalemia: Secondary | ICD-10-CM

## 2019-12-18 LAB — COMPREHENSIVE METABOLIC PANEL
ALT: 6 U/L (ref 0–35)
AST: 12 U/L (ref 0–37)
Albumin: 3.7 g/dL (ref 3.5–5.2)
Alkaline Phosphatase: 80 U/L (ref 39–117)
BUN: 17 mg/dL (ref 6–23)
CO2: 27 mEq/L (ref 19–32)
Calcium: 9 mg/dL (ref 8.4–10.5)
Chloride: 106 mEq/L (ref 96–112)
Creatinine, Ser: 1.27 mg/dL — ABNORMAL HIGH (ref 0.40–1.20)
GFR: 39.01 mL/min — ABNORMAL LOW (ref >60.00)
Glucose, Bld: 86 mg/dL (ref 70–99)
Potassium: 3.4 mEq/L — ABNORMAL LOW (ref 3.5–5.1)
Sodium: 142 mEq/L (ref 135–145)
Total Bilirubin: 0.4 mg/dL (ref 0.2–1.2)
Total Protein: 6.2 g/dL (ref 6.0–8.3)

## 2019-12-18 LAB — LIPID PANEL
Cholesterol: 214 mg/dL — ABNORMAL HIGH (ref 0–200)
HDL: 34.5 mg/dL — ABNORMAL LOW (ref >39.00)
NonHDL: 179.7
Total CHOL/HDL Ratio: 6
Triglycerides: 247 mg/dL — ABNORMAL HIGH (ref 0.0–149.0)
VLDL: 49.4 mg/dL — ABNORMAL HIGH (ref 0.0–40.0)

## 2019-12-18 LAB — HEMOGLOBIN A1C: Hgb A1c MFr Bld: 6 % (ref 4.6–6.5)

## 2019-12-18 LAB — LDL CHOLESTEROL, DIRECT: Direct LDL: 158 mg/dL

## 2019-12-19 ENCOUNTER — Other Ambulatory Visit: Payer: Self-pay

## 2019-12-19 DIAGNOSIS — E785 Hyperlipidemia, unspecified: Secondary | ICD-10-CM

## 2019-12-19 MED ORDER — ROSUVASTATIN CALCIUM 5 MG PO TABS: 5.0000 mg | ORAL_TABLET | Freq: Every day | ORAL | 4 refills | Status: DC

## 2019-12-25 ENCOUNTER — Other Ambulatory Visit: Payer: Self-pay

## 2019-12-25 ENCOUNTER — Other Ambulatory Visit (INDEPENDENT_AMBULATORY_CARE_PROVIDER_SITE_OTHER): Payer: Medicare PPO

## 2019-12-25 DIAGNOSIS — E785 Hyperlipidemia, unspecified: Secondary | ICD-10-CM | POA: Diagnosis not present

## 2019-12-25 LAB — COMPREHENSIVE METABOLIC PANEL
ALT: 7 U/L (ref 0–35)
AST: 13 U/L (ref 0–37)
Albumin: 3.9 g/dL (ref 3.5–5.2)
Alkaline Phosphatase: 83 U/L (ref 39–117)
BUN: 25 mg/dL — ABNORMAL HIGH (ref 6–23)
CO2: 27 mEq/L (ref 19–32)
Calcium: 9 mg/dL (ref 8.4–10.5)
Chloride: 106 mEq/L (ref 96–112)
Creatinine, Ser: 1.19 mg/dL (ref 0.40–1.20)
GFR: 42.17 mL/min — ABNORMAL LOW (ref >60.00)
Glucose, Bld: 89 mg/dL (ref 70–99)
Potassium: 4.3 mEq/L (ref 3.5–5.1)
Sodium: 141 mEq/L (ref 135–145)
Total Bilirubin: 0.4 mg/dL (ref 0.2–1.2)
Total Protein: 6.7 g/dL (ref 6.0–8.3)

## 2019-12-26 ENCOUNTER — Encounter (INDEPENDENT_AMBULATORY_CARE_PROVIDER_SITE_OTHER): Payer: Self-pay | Admitting: Vascular Surgery

## 2019-12-26 ENCOUNTER — Other Ambulatory Visit: Payer: Self-pay

## 2019-12-26 ENCOUNTER — Ambulatory Visit (INDEPENDENT_AMBULATORY_CARE_PROVIDER_SITE_OTHER): Payer: Medicare PPO | Admitting: Vascular Surgery

## 2019-12-26 VITALS — BP 165/77 | HR 65 | Resp 16 | Ht 67.0 in | Wt 139.6 lb

## 2019-12-26 DIAGNOSIS — I10 Essential (primary) hypertension: Secondary | ICD-10-CM

## 2019-12-26 DIAGNOSIS — N183 Chronic kidney disease, stage 3 unspecified: Secondary | ICD-10-CM | POA: Diagnosis not present

## 2019-12-26 DIAGNOSIS — R7303 Prediabetes: Secondary | ICD-10-CM | POA: Diagnosis not present

## 2019-12-26 DIAGNOSIS — I87009 Postthrombotic syndrome without complications of unspecified extremity: Secondary | ICD-10-CM | POA: Diagnosis not present

## 2019-12-26 MED ORDER — EZETIMIBE 10 MG PO TABS: 10.0000 mg | ORAL_TABLET | Freq: Every day | ORAL | 0 refills | Status: DC

## 2019-12-26 NOTE — Assessment & Plan Note (Signed)
blood glucose control important in reducing the progression of atherosclerotic disease. Also, involved in wound healing. On appropriate medications.  

## 2019-12-26 NOTE — Patient Instructions (Signed)

## 2019-12-26 NOTE — Assessment & Plan Note (Signed)
Can certainly worsen lower extremity edema from fluid retention.

## 2019-12-26 NOTE — Assessment & Plan Note (Signed)
blood pressure control important in reducing the progression of atherosclerotic disease. On appropriate oral medications.  

## 2019-12-26 NOTE — Progress Notes (Signed)
[name]: Mia Perez, female   DOB: [birth year], 86 y.o.   MRN: [id number]  Chief Complaint  Patient presents with  . New Patient (Initial Visit)    ref Arnett discuss care plan    HPI Mia Perez is a 86 y.o. female.  I am asked to see the patient by M. Arnett for evaluation of leg swelling and discoloration with previous history of extensive DVT.  We treated her for DVT about 5 years ago when she had her filter removed in early 2017 after her initial intervention.  She has done fairly well since that time.  She has remained on anticoagulation.  Her insurance denied Eliquis at the time of her DVT which may have worsened the DVT.  She really has no appreciable swelling.  She does have marked stasis dermatitis on that right leg and mild to moderate stasis dermatitis changes present on the left.  She has very prominent lower leg varicosities as well.  The leg is occasionally uncomfortable but not overtly painful.  No ulceration or infection.  She remains on aspirin therapy.  She wears her compression stockings every day.  She does try to elevate her legs as well.     Past Medical History:  Diagnosis Date  . Anemia    H/O  . Arthritis   . Breast cancer ([facility]) 02/10/1996   left, lumpectomy and radiation  . Breast cancer ([place] [facility]) 2017   right  . DVT (deep venous thrombosis) ([place]) 11/2014  . GERD (gastroesophageal reflux disease)   . Hypertension   . Lymphocytic colitis   . Personal history of radiation therapy 1998, 2017   BREAST CA  . Ulcerative colitis ([facility]) 2014  . Varicose veins     Past Surgical History:  Procedure Laterality Date  . BREAST EXCISIONAL BIOPSY Right 2014   NEG  . BREAST LUMPECTOMY Left 1998   radiation  . BREAST LUMPECTOMY Right 2017  . BREAST LUMPECTOMY WITH SENTINEL LYMPH NODE BIOPSY Right 12/30/2015   8 mm, T1b, N0; RUOQ, ER 95%; PR: 80 %, Her 2 neu not overexpressed.Mammosite Radiation.  Marland Kitchen BREAST SURGERY Right 2014   Microcalcifications excised  with wire localization/ Dr Bary Castilla  . COLONOSCOPY  2012   Dr Earlean Shawl  . DILATION AND CURETTAGE OF UTERUS    . HERNIA REPAIR    . HYSTEROSCOPY    . NECK SURGERY    . PERIPHERAL VASCULAR CATHETERIZATION N/A 12/13/2014   Procedure: IVC Filter Insertion;  Surgeon: Algernon Huxley, MD;  Location: [facility];  Service: Cardiovascular;  Laterality: N/A;  . PERIPHERAL VASCULAR CATHETERIZATION  12/13/2014   Procedure: Lower Extremity Intervention;  Surgeon: Algernon Huxley, MD;  Location: [facility];  Service: Cardiovascular;;  . PERIPHERAL VASCULAR CATHETERIZATION N/A 02/20/2015   Procedure: IVC Filter Removal;  Surgeon: Algernon Huxley, MD;  Location: [facility];  Service: Cardiovascular;  Laterality: N/A;     Family History  Problem Relation Age of Onset  . Heart disease Mother   . Hypertension Mother   . Ovarian cancer Mother 44  . Hypertension Sister   . Hypertension Brother   . Ovarian cancer Paternal Aunt 85  . Breast cancer Neg Hx      Social History   Tobacco Use  . Smoking status: Never Smoker  . Smokeless tobacco: Never Used  Substance Use Topics  . Alcohol use: No  . Drug use: No    Allergies  Allergen Reactions  .  Pravachol [Pravastatin]     diarrhea  . Penicillins Rash    Has patient had a PCN reaction causing immediate rash, facial/tongue/throat swelling, SOB or lightheadedness with hypotension: Yes Has patient had a PCN reaction causing severe rash involving mucus membranes or skin necrosis: Unknown Has patient had a PCN reaction that required hospitalization No Has patient had a PCN reaction occurring within the last 10 years: No If all of the above answers are "NO", then may proceed with Cephalosporin use.     Current Outpatient Medications  Medication Sig Dispense Refill  . amLODipine (NORVASC) 2.5 MG tablet Take 1 tablet (2.5 mg total) by mouth daily. Take another 2.5mg  PO in the evening if blood pressure > 145/90. 90 tablet 3  .  amLODipine (NORVASC) 5 MG tablet Take 1 tablet (5 mg total) by mouth daily. 90 tablet 3  . aspirin 325 MG tablet Take 325 mg by mouth daily.    . calcium gluconate 500 MG tablet Take 1 tablet by mouth daily.    . cholecalciferol (VITAMIN D) 25 MCG (1000 UNIT) tablet Take 1,000 Units by mouth daily.    . Cyanocobalamin (VITAMIN B-12) 5000 MCG TBDP Take 5,000 mcg by mouth every other day.     . diphenoxylate-atropine (LOMOTIL) 2.5-0.025 MG tablet TAKE 1 TABLET BY MOUTH 4 TIMES A DAY AS NEEDED FOR LOOSE STOOLS (Patient taking differently: Take 2-3 tablets daily as needed for loose stools) 360 tablet 0  . dorzolamidel-timolol (COSOPT) 22.3-6.8 MG/ML SOLN ophthalmic solution Place 1 drop into both eyes 2 (two) times daily. Take one drop in both eyes in the morning & evening before bed.    . letrozole (FEMARA) 2.5 MG tablet TAKE 1 TABLET BY MOUTH DAILY 90 tablet 4  . losartan (COZAAR) 100 MG tablet Take 1 tablet (100 mg total) by mouth every evening. 90 tablet 3  . LUMIGAN 0.01 % SOLN Place 1 drop into both eyes at bedtime.     . metoprolol succinate (TOPROL-XL) 100 MG 24 hr tablet TAKE 1 TABLET (100 MG TOTAL) BY MOUTH DAILY. TAKE WITH OR IMMEDIATELY FOLLOWING A MEAL. APPT NEEDED 90 tablet 3  . Multiple Vitamins-Minerals (PRESERVISION AREDS 2 PO) Take 1 tablet by mouth 2 (two) times daily.     . rosuvastatin (CRESTOR) 5 MG tablet Take 1 tablet (5 mg total) by mouth daily. 30 tablet 4  . simethicone (MYLICON) 702 MG chewable tablet Chew 125 mg by mouth every morning.     . budesonide (ENTOCORT EC) 3 MG 24 hr capsule Take 1 capsule (3 mg total) by mouth daily. (Patient not taking: Reported on 12/06/2019) 90 capsule 3   No current facility-administered medications for this visit.      REVIEW OF SYSTEMS (Negative unless checked)  Constitutional: [] Weight loss  [] Fever  [] Chills Cardiac: [] Chest pain   [] Chest pressure   [] Palpitations   [] Shortness of breath when laying flat   [] Shortness of breath at  rest   [] Shortness of breath with exertion. Vascular:  [x] Pain in legs with walking   [] Pain in legs at rest   [] Pain in legs when laying flat   [] Claudication   [] Pain in feet when walking  [] Pain in feet at rest  [] Pain in feet when laying flat   [x] History of DVT   [x] Phlebitis   [x] Swelling in legs   [] Varicose veins   [] Non-healing ulcers Pulmonary:   [] Uses home oxygen   [] Productive cough   [] Hemoptysis   [] Wheeze  [] COPD   [] Asthma  Neurologic:  [] Dizziness  [] Blackouts   [] Seizures   [] History of stroke   [] History of TIA  [] Aphasia   [] Temporary blindness   [] Dysphagia   [] Weakness or numbness in arms   [] Weakness or numbness in legs Musculoskeletal:  [x] Arthritis   [] Joint swelling   [] Joint pain   [] Low back pain Hematologic:  [] Easy bruising  [] Easy bleeding   [] Hypercoagulable state   [] Anemic  [] Hepatitis Gastrointestinal:  [] Blood in stool   [] Vomiting blood  [] Gastroesophageal reflux/heartburn   [] Abdominal pain Genitourinary:  [] Chronic kidney disease   [] Difficult urination  [] Frequent urination  [] Burning with urination   [] Hematuria Skin:  [] Rashes   [] Ulcers   [] Wounds Psychological:  [] History of anxiety   []  History of major depression.    Physical Exam BP (!) 165/77 (BP Location: Right Arm)   Pulse 65   Resp 16   Ht 5\' 7"  (1.702 m)   Wt 139 lb 9.6 oz (63.3 kg)   BMI 21.86 kg/m  Gen:  WD/WN, NAD. Appears younger than stated age. Head: [place]/AT, No temporalis wasting. Ear/Nose/Throat: Hearing grossly intact, nares w/o erythema or drainage, oropharynx w/o Erythema/Exudate Eyes: Conjunctiva clear, sclera non-icteric  Neck: trachea midline.  No JVD.  Pulmonary:  Good air movement, respirations not labored, no use of accessory muscles  Cardiac: RRR, no JVD Vascular:  Vessel Right Left  Radial Palpable Palpable                                   Gastrointestinal:. No masses, surgical incisions, or scars. Musculoskeletal: M/S 5/5 throughout.  Extremities without  ischemic changes.  No deformity or atrophy.  Marked stasis dermatitis changes are present on the right leg with very prominent varicosities in the lower leg.  Mild to moderate stasis dermatitis changes are present on the left leg.  No appreciable edema today. Neurologic: Sensation grossly intact in extremities.  Symmetrical.  Speech is fluent. Motor exam as listed above. Psychiatric: Judgment intact, Mood & affect appropriate for pt's clinical situation. Dermatologic: No rashes or ulcers noted.  No cellulitis or open wounds.    Radiology No results found.  Labs Recent Results (from the past 2160 hour(s))  Lipid panel     Status: Abnormal   Collection Time: 12/18/19  9:18 AM  Result Value Ref Range   Cholesterol 214 (H) 0 - 200 mg/dL    Comment: ATP III Classification       Desirable:  < 200 mg/dL               Borderline High:  200 - 239 mg/dL          High:  > = 240 mg/dL   Triglycerides 247.0 (H) 0 - 149 mg/dL    Comment: Normal:  <150 mg/dLBorderline High:  150 - 199 mg/dL   HDL 34.50 (L) >39.00 mg/dL   VLDL 49.4 (H) 0.0 - 40.0 mg/dL   Total CHOL/HDL Ratio 6     Comment:                Men          Women1/2 Average Risk     3.4          3.3Average Risk          5.0          4.42X Average Risk          9.6  7.13X Average Risk          15.0          11.0                       NonHDL 179.70     Comment: NOTE:  Non-HDL goal should be 30 mg/dL higher than patient's LDL goal (i.e. LDL goal of < 70 mg/dL, would have non-HDL goal of < 100 mg/dL)  Comprehensive metabolic panel     Status: Abnormal   Collection Time: 12/18/19  9:18 AM  Result Value Ref Range   Sodium 142 135 - 145 mEq/L   Potassium 3.4 (L) 3.5 - 5.1 mEq/L   Chloride 106 96 - 112 mEq/L   CO2 27 19 - 32 mEq/L   Glucose, Bld 86 70 - 99 mg/dL   BUN 17 6 - 23 mg/dL   Creatinine, Ser 1.27 (H) 0.40 - 1.20 mg/dL   Total Bilirubin 0.4 0.2 - 1.2 mg/dL   Alkaline Phosphatase 80 39 - 117 U/L   AST 12 0 - 37 U/L   ALT 6 0 -  35 U/L   Total Protein 6.2 6.0 - 8.3 g/dL   Albumin 3.7 3.5 - 5.2 g/dL   GFR 39.01 (L) >60.00 mL/min    Comment: Calculated using the CKD-EPI Creatinine Equation (2021)   Calcium 9.0 8.4 - 10.5 mg/dL  Hemoglobin A1c     Status: None   Collection Time: 12/18/19  9:18 AM  Result Value Ref Range   Hgb A1c MFr Bld 6.0 4.6 - 6.5 %    Comment: Glycemic Control Guidelines for People with Diabetes:Non Diabetic:  <6%Goal of Therapy: <7%Additional Action Suggested:  >8%   LDL cholesterol, direct     Status: None   Collection Time: 12/18/19  9:18 AM  Result Value Ref Range   Direct LDL 158.0 mg/dL    Comment: Optimal:  <100 mg/dLNear or Above Optimal:  100-129 mg/dLBorderline High:  130-159 mg/dLHigh:  160-189 mg/dLVery High:  >190 mg/dL  Comprehensive metabolic panel     Status: Abnormal   Collection Time: 12/25/19 10:43 AM  Result Value Ref Range   Sodium 141 135 - 145 mEq/L   Potassium 4.3 3.5 - 5.1 mEq/L   Chloride 106 96 - 112 mEq/L   CO2 27 19 - 32 mEq/L   Glucose, Bld 89 70 - 99 mg/dL   BUN 25 (H) 6 - 23 mg/dL   Creatinine, Ser 1.19 0.40 - 1.20 mg/dL   Total Bilirubin 0.4 0.2 - 1.2 mg/dL   Alkaline Phosphatase 83 39 - 117 U/L   AST 13 0 - 37 U/L   ALT 7 0 - 35 U/L   Total Protein 6.7 6.0 - 8.3 g/dL   Albumin 3.9 3.5 - 5.2 g/dL   GFR 42.17 (L) >60.00 mL/min    Comment: Calculated using the CKD-EPI Creatinine Equation (2021)   Calcium 9.0 8.4 - 10.5 mg/dL    Assessment/Plan:  Post-phlebitic syndrome The patient has chronic stasis dermatitis changes present after an extensive DVT 4 to 5 years ago.  Overall, she has done quite well after her treatment for her DVT.  She remains on aspirin therapy and should remain on this indefinitely.  I am okay if she does not 81 mg aspirin or 325 aspirin.  I have discussed the importance of continuing to wear her compression stockings and elevate her legs.  I discussed the signs and symptoms of a new DVT for  which she should seek immediate medical  therapy.  The patient voices her understanding.  I will see her back as needed.   Essential hypertension blood pressure control important in reducing the progression of atherosclerotic disease. On appropriate oral medications.   Chronic kidney disease, stage 3 unspecified Can certainly worsen lower extremity edema from fluid retention.  Prediabetes blood glucose control important in reducing the progression of atherosclerotic disease. Also, involved in wound healing. On appropriate medications.       Leotis Pain 12/26/2019, 11:54 AM   This note was created with Dragon medical transcription system.  Any errors from dictation are unintentional.

## 2019-12-26 NOTE — Assessment & Plan Note (Addendum)
The patient has chronic stasis dermatitis changes present after an extensive DVT 4 to 5 years ago.  Overall, she has done quite well after her treatment for her DVT.  She remains on aspirin therapy and should remain on this indefinitely.  I am okay if she does not 81 mg aspirin or 325 aspirin.  I have discussed the importance of continuing to wear her compression stockings and elevate her legs.  I discussed the signs and symptoms of a new DVT for which she should seek immediate medical therapy.  The patient voices her understanding.  I will see her back as needed.

## 2019-12-28 ENCOUNTER — Ambulatory Visit
Admission: RE | Admit: 2019-12-28 | Discharge: 2019-12-28 | Disposition: A | Discharge status: Home / Self Care | Payer: Medicare PPO | Source: Ambulatory Visit | Attending: Family | Admitting: Family

## 2019-12-28 ENCOUNTER — Other Ambulatory Visit: Payer: Self-pay

## 2019-12-28 DIAGNOSIS — Z1239 Encounter for other screening for malignant neoplasm of breast: Secondary | ICD-10-CM | POA: Insufficient documentation

## 2019-12-28 DIAGNOSIS — C50912 Malignant neoplasm of unspecified site of left female breast: Secondary | ICD-10-CM

## 2019-12-28 DIAGNOSIS — C50911 Malignant neoplasm of unspecified site of right female breast: Secondary | ICD-10-CM

## 2019-12-28 DIAGNOSIS — Z853 Personal history of malignant neoplasm of breast: Secondary | ICD-10-CM | POA: Insufficient documentation

## 2020-01-19 ENCOUNTER — Other Ambulatory Visit: Payer: Self-pay | Admitting: Family

## 2020-01-19 DIAGNOSIS — I10 Essential (primary) hypertension: Secondary | ICD-10-CM

## 2020-01-30 ENCOUNTER — Other Ambulatory Visit: Payer: Medicare PPO

## 2020-03-07 ENCOUNTER — Other Ambulatory Visit: Payer: Self-pay | Admitting: Family

## 2020-03-07 DIAGNOSIS — I10 Essential (primary) hypertension: Secondary | ICD-10-CM

## 2020-03-08 ENCOUNTER — Telehealth: Payer: Self-pay | Admitting: Family

## 2020-03-08 ENCOUNTER — Other Ambulatory Visit: Payer: Self-pay

## 2020-03-08 DIAGNOSIS — N189 Chronic kidney disease, unspecified: Secondary | ICD-10-CM

## 2020-03-08 DIAGNOSIS — R7303 Prediabetes: Secondary | ICD-10-CM

## 2020-03-08 DIAGNOSIS — I10 Essential (primary) hypertension: Secondary | ICD-10-CM

## 2020-03-08 DIAGNOSIS — E785 Hyperlipidemia, unspecified: Secondary | ICD-10-CM

## 2020-03-08 NOTE — Telephone Encounter (Signed)
Call pt Yes please dc zetia

## 2020-03-08 NOTE — Telephone Encounter (Signed)
Patient called in stated that this medication is making her sick ezetimibe (ZETIA) 10 MG tablet

## 2020-03-08 NOTE — Telephone Encounter (Signed)
Pt stated that she would d/c the Zetia. She feels that everything almost she puts in her mouth irritates her gut & she has lost some weight. She thought that maybe seeing a nutritionist would help give her an idea what she could eat given her colitis. Would you be able to refer her?

## 2020-03-08 NOTE — Telephone Encounter (Signed)
Due to patients Lymphocytic Colitis should I have her just d/c?

## 2020-03-09 ENCOUNTER — Other Ambulatory Visit: Payer: Self-pay | Admitting: Family

## 2020-03-09 DIAGNOSIS — I10 Essential (primary) hypertension: Secondary | ICD-10-CM

## 2020-03-11 MED ORDER — LOSARTAN POTASSIUM 100 MG PO TABS: 100.0000 mg | ORAL_TABLET | Freq: Every evening | ORAL | 1 refills | Status: DC

## 2020-03-11 NOTE — Telephone Encounter (Signed)
Call pt Im confused as well  I resent losartan 100mg  She will need bmp in one week Please sch

## 2020-03-11 NOTE — Telephone Encounter (Signed)
Spoke with patient concerning referral and she voiced understanding. patient thad question as to why Losartan 100 mg  refill denied I saw DC by CMA due to patient preference on 12/26/2019 per PCP last note states to continue losartan 100 mg on 10/21.

## 2020-03-11 NOTE — Telephone Encounter (Signed)
Call pt I think nutritionist is a great idea I have placed referral Let us know if you dont hear back within a week in regards to an appointment being scheduled.

## 2020-03-12 NOTE — Telephone Encounter (Signed)
I have notified patient and scheduled repeat lab.

## 2020-03-19 ENCOUNTER — Other Ambulatory Visit: Payer: Self-pay

## 2020-03-19 ENCOUNTER — Other Ambulatory Visit (INDEPENDENT_AMBULATORY_CARE_PROVIDER_SITE_OTHER): Payer: Medicare PPO

## 2020-03-19 DIAGNOSIS — I10 Essential (primary) hypertension: Secondary | ICD-10-CM | POA: Diagnosis not present

## 2020-03-19 LAB — BASIC METABOLIC PANEL
BUN: 26 mg/dL — ABNORMAL HIGH (ref 6–23)
CO2: 26 mEq/L (ref 19–32)
Calcium: 9.6 mg/dL (ref 8.4–10.5)
Chloride: 105 mEq/L (ref 96–112)
Creatinine, Ser: 1.35 mg/dL — ABNORMAL HIGH (ref 0.40–1.20)
GFR: 36.19 mL/min — ABNORMAL LOW (ref >60.00)
Glucose, Bld: 85 mg/dL (ref 70–99)
Potassium: 4 mEq/L (ref 3.5–5.1)
Sodium: 136 mEq/L (ref 135–145)

## 2020-03-28 ENCOUNTER — Other Ambulatory Visit: Payer: Medicare PPO

## 2020-04-08 ENCOUNTER — Encounter: Payer: Self-pay | Admitting: Family

## 2020-04-08 ENCOUNTER — Ambulatory Visit: Payer: Medicare PPO | Admitting: Family

## 2020-04-08 ENCOUNTER — Other Ambulatory Visit: Payer: Self-pay

## 2020-04-08 VITALS — BP 122/64 | HR 58 | Temp 97.5°F | Ht 67.0 in | Wt 135.2 lb

## 2020-04-08 DIAGNOSIS — R7303 Prediabetes: Secondary | ICD-10-CM | POA: Diagnosis not present

## 2020-04-08 DIAGNOSIS — I87009 Postthrombotic syndrome without complications of unspecified extremity: Secondary | ICD-10-CM

## 2020-04-08 DIAGNOSIS — I82511 Chronic embolism and thrombosis of right femoral vein: Secondary | ICD-10-CM | POA: Diagnosis not present

## 2020-04-08 DIAGNOSIS — E785 Hyperlipidemia, unspecified: Secondary | ICD-10-CM

## 2020-04-08 DIAGNOSIS — K52832 Lymphocytic colitis: Secondary | ICD-10-CM

## 2020-04-08 DIAGNOSIS — I10 Essential (primary) hypertension: Secondary | ICD-10-CM

## 2020-04-08 LAB — TSH: TSH: 3.54 u[IU]/mL (ref 0.35–4.50)

## 2020-04-08 LAB — HEMOGLOBIN A1C: Hgb A1c MFr Bld: 5.9 % (ref 4.6–6.5)

## 2020-04-08 NOTE — Assessment & Plan Note (Signed)
Uncontrolled. Unable to tolerate crestor as caused diarrhea. Patient to pursue nutrition consult.

## 2020-04-08 NOTE — Patient Instructions (Addendum)
Let me know if you would like referral to Dr Jonathon Bellows in [place].  Nice to see you!

## 2020-04-08 NOTE — Assessment & Plan Note (Addendum)
Discussed consult with Dr Lucky Cowboy. She feels comfortable taking 325mg  ASA and understands of risks of bleeding  Including GIB, ICH. She would like to continue asa 325mg .

## 2020-04-08 NOTE — Assessment & Plan Note (Signed)
Chronic, stable. Will follow.

## 2020-04-08 NOTE — Progress Notes (Signed)
Subjective:    [name]: Mia Perez, female    DOB: Mar 18, 1933, 87 y.o.   MRN: [id number]  CC: AISSATA Perez is a 87 y.o. female who presents today for follow up.   HPI: Feels well today. No new complaints.   She continues to struggle with colitis which has been chronic for her. She was unable to take crestor as felt make it ,ade diarrhea worse. No fever, blood in stool. 4 lb weight loss. She continues to see Dr Earlean Shawl annually.  HLD- she is pursuing lifestyle with nutrition.   HTN- compliant with  losartan 100mg , metoprolol 100mg  XR, amlodipine 7.5 mgdaily with additional 2.5mg  prn however reports she seldom takes.    CKD- following with Dr [name] whom she saw today.   She has decided to stay on ASA 81mg . No bleeding. No falls.    BMP repeated 04/01/20. K 3.5, Crt 1.27  HISTORY:  Past Medical History:  Diagnosis Date  . Anemia    H/O  . Arthritis   . Breast cancer ([place]) 02/10/1996   left, lumpectomy and radiation  . Breast cancer ([place]) 2017   right  . DVT (deep venous thrombosis) ([place]) 11/2014  . GERD (gastroesophageal reflux disease)   . Hypertension   . Lymphocytic colitis   . Personal history of radiation therapy 1998, 2017   BREAST CA  . Ulcerative colitis ([place]) 2014  . Varicose veins    Past Surgical History:  Procedure Laterality Date  . BREAST EXCISIONAL BIOPSY Right 2014   NEG  . BREAST LUMPECTOMY Left 1998   radiation  . BREAST LUMPECTOMY Right 2017  . BREAST LUMPECTOMY WITH SENTINEL LYMPH NODE BIOPSY Right 12/30/2015   8 mm, T1b, N0; RUOQ, ER 95%; PR: 80 %, Her 2 neu not overexpressed.Mammosite Radiation.  Marland Kitchen BREAST SURGERY Right 2014   Microcalcifications excised with wire localization/ Dr Bary Castilla  . COLONOSCOPY  2012   Dr Earlean Shawl  . DILATION AND CURETTAGE OF UTERUS    . HERNIA REPAIR    . HYSTEROSCOPY    . NECK SURGERY    . PERIPHERAL VASCULAR CATHETERIZATION N/A 12/13/2014   Procedure: IVC Filter Insertion;  Surgeon: Algernon Huxley, MD;   Location: [facility];  Service: Cardiovascular;  Laterality: N/A;  . PERIPHERAL VASCULAR CATHETERIZATION  12/13/2014   Procedure: Lower Extremity Intervention;  Surgeon: Algernon Huxley, MD;  Location: [facility];  Service: Cardiovascular;;  . PERIPHERAL VASCULAR CATHETERIZATION N/A 02/20/2015   Procedure: IVC Filter Removal;  Surgeon: Algernon Huxley, MD;  Location: [facility];  Service: Cardiovascular;  Laterality: N/A;   Family History  Problem Relation Age of Onset  . Heart disease Mother   . Hypertension Mother   . Ovarian cancer Mother 32  . Hypertension Sister   . Hypertension Brother   . Ovarian cancer Paternal Aunt 85  . Breast cancer Neg Hx     Allergies: Pravachol [pravastatin] and Penicillins Current Outpatient Medications on File Prior to Visit  Medication Sig Dispense Refill  . amLODipine (NORVASC) 2.5 MG tablet TAKE 1 TABLET BY MOUTH DAILY. TAKE ANOTHER TABLET IN THE EVENING IF BLOOD PRESSURE > 145/90. 90 tablet 3  . amLODipine (NORVASC) 5 MG tablet TAKE 1 TABLET BY MOUTH EVERY DAY 90 tablet 3  . aspirin 325 MG tablet Take 325 mg by mouth daily.    . calcium gluconate 500 MG tablet Take 1 tablet by mouth daily.    . cholecalciferol (VITAMIN D)  25 MCG (1000 UNIT) tablet Take 1,000 Units by mouth daily.    . Cyanocobalamin (VITAMIN B-12) 5000 MCG TBDP Take 5,000 mcg by mouth every other day.     . diphenoxylate-atropine (LOMOTIL) 2.5-0.025 MG tablet TAKE 1 TABLET BY MOUTH 4 TIMES A DAY AS NEEDED FOR LOOSE STOOLS 360 tablet 0  . dorzolamidel-timolol (COSOPT) 22.3-6.8 MG/ML SOLN ophthalmic solution Place 1 drop into both eyes 2 (two) times daily. Take one drop in both eyes in the morning & evening before bed.    . letrozole (FEMARA) 2.5 MG tablet TAKE 1 TABLET BY MOUTH DAILY 90 tablet 4  . losartan (COZAAR) 100 MG tablet Take 1 tablet (100 mg total) by mouth every evening. 90 tablet 1  . LUMIGAN 0.01 % SOLN Place 1 drop into both eyes at bedtime.     .  metoprolol succinate (TOPROL-XL) 100 MG 24 hr tablet TAKE 1 TABLET (100 MG TOTAL) BY MOUTH DAILY. TAKE WITH OR IMMEDIATELY FOLLOWING A MEAL. APPT NEEDED 90 tablet 3  . Multiple Vitamins-Minerals (PRESERVISION AREDS 2 PO) Take 1 tablet by mouth 2 (two) times daily.     . simethicone (MYLICON) 962 MG chewable tablet Chew 125 mg by mouth every morning.     No current facility-administered medications on file prior to visit.    Social History   Tobacco Use  . Smoking status: Never Smoker  . Smokeless tobacco: Never Used  Substance Use Topics  . Alcohol use: No  . Drug use: No    Review of Systems  Constitutional: Negative for chills and fever.  Respiratory: Negative for cough.   Cardiovascular: Negative for chest pain and palpitations.  Gastrointestinal: Positive for diarrhea. Negative for nausea and vomiting.      Objective:    BP 122/64   Pulse (!) 58   Temp (!) 97.5 F (36.4 C)   Ht 5\' 7"  (1.702 m)   Wt 135 lb 3.2 oz (61.3 kg)   SpO2 98%   BMI 21.18 kg/m  BP Readings from Last 3 Encounters:  04/08/20 122/64  12/26/19 (!) 165/77  12/06/19 122/70   Wt Readings from Last 3 Encounters:  04/08/20 135 lb 3.2 oz (61.3 kg)  12/26/19 139 lb 9.6 oz (63.3 kg)  12/06/19 141 lb (64 kg)    Physical Exam Vitals reviewed.  Constitutional:      Appearance: She is well-developed and well-nourished.  Eyes:     Conjunctiva/sclera: Conjunctivae normal.  Cardiovascular:     Rate and Rhythm: Normal rate and regular rhythm.     Pulses: Normal pulses.     Heart sounds: Normal heart sounds.  Pulmonary:     Effort: Pulmonary effort is normal.     Breath sounds: Normal breath sounds. No wheezing, rhonchi or rales.  Skin:    General: Skin is warm and dry.  Neurological:     Mental Status: She is alert.  Psychiatric:        Mood and Affect: Mood and affect normal.        Speech: Speech normal.        Behavior: Behavior normal.        Thought Content: Thought content normal.         Assessment & Plan:   Problem List Items Addressed This Visit      Cardiovascular and Mediastinum   DVT (deep venous thrombosis) ([place])    Discussed consult with Dr Lucky Cowboy. She feels comfortable taking 325mg  ASA and understands of risks of bleeding  Including GIB, ICH. She would like to continue asa 325mg .       Essential hypertension    Controlled. continue  losartan 100mg , metoprolol 100mg  XR, amlodipine 7.5 mgdaily      Post-phlebitic syndrome - Primary     Digestive   Lymphocytic colitis    Chronic, stable. Will follow.         Other   Hyperlipidemia, unspecified    Uncontrolled. Unable to tolerate crestor as caused diarrhea. Patient to pursue nutrition consult.       Prediabetes   Relevant Orders   Hemoglobin A1c   TSH       I have discontinued Marty B. Fite's budesonide and rosuvastatin. I am also having her maintain her diphenoxylate-atropine, Lumigan, aspirin, Multiple Vitamins-Minerals (PRESERVISION AREDS 2 PO), simethicone, Vitamin B-12, letrozole, metoprolol succinate, dorzolamidel-timolol, cholecalciferol, calcium gluconate, amLODipine, amLODipine, and losartan.   No orders of the defined types were placed in this encounter.   Return precautions given.   Risks, benefits, and alternatives of the medications and treatment plan prescribed today were discussed, and patient expressed understanding.   Education regarding symptom management and diagnosis given to patient on AVS.  Continue to follow with Burnard Hawthorne, FNP for routine health maintenance.   Mia Perez and I agreed with plan.   Mable Paris, FNP

## 2020-04-08 NOTE — Assessment & Plan Note (Signed)
Controlled. continue  losartan 100mg , metoprolol 100mg  XR, amlodipine 7.5 mgdaily

## 2020-04-09 ENCOUNTER — Other Ambulatory Visit: Payer: Self-pay | Admitting: Family

## 2020-04-09 DIAGNOSIS — I10 Essential (primary) hypertension: Secondary | ICD-10-CM

## 2020-04-11 ENCOUNTER — Encounter: Payer: Self-pay | Admitting: Dietician

## 2020-04-11 ENCOUNTER — Other Ambulatory Visit: Payer: Self-pay

## 2020-04-11 ENCOUNTER — Encounter: Payer: Medicare PPO | Attending: Family | Admitting: Dietician

## 2020-04-11 VITALS — Ht 67.0 in | Wt 134.5 lb

## 2020-04-11 DIAGNOSIS — N183 Chronic kidney disease, stage 3 unspecified: Secondary | ICD-10-CM

## 2020-04-11 DIAGNOSIS — E785 Hyperlipidemia, unspecified: Secondary | ICD-10-CM | POA: Diagnosis not present

## 2020-04-11 DIAGNOSIS — N189 Chronic kidney disease, unspecified: Secondary | ICD-10-CM | POA: Insufficient documentation

## 2020-04-11 DIAGNOSIS — R7303 Prediabetes: Secondary | ICD-10-CM | POA: Insufficient documentation

## 2020-04-11 NOTE — Patient Instructions (Signed)
   For a low fat bread, try frozen dinner rolls like Sister Shubert roll. You can heat one at a time, and they are lower fat than biscuits and even cornbread.   Choose cheese that is "light" or "2%" for less unhealthy saturated fat; choose 1% milk.   Try [place] sausage links such as Danton Clap brand.   OK to pan-fry foods (especially if not breaded or batter coated) in canola or other vegetable oil; avoid deep-fried   Include a vegetable or a fruit or both with each meal. Increasing these foods lowers bad cholesterol.  Start back with some light exercise at the [facility]. Regular exercise helps lower bad cholesterol and improves good cholesterol.  Avoid large meals; eating small amounts more often during the day sometimes helps improve diarrhea from colitis.

## 2020-04-11 NOTE — Progress Notes (Signed)
Medical Nutrition Therapy: Visit start time: 1330  end time: 1450  Assessment:  Diagnosis: hyperlipidemia, CKD, pre-diabetes Past medical history: lymphocytic colitis Psychosocial issues/ stress concerns: none  Preferred learning method:  . Auditory . Hands-on   Current weight: 134.5lbs Height: 5'7" Medications, supplements: reconciled list in medical record  Progress and evaluation:   Recent lab results (12/18/19) indicate total cholesterol 214, LDL 158  HDL 34, TG 247. HbA1C was 5.9% on 04/08/20. GFR 36.19 on 03/19/20  Patient states her main concern is reducing cholesterol as she is unable to tolerate cholesterol lowering meds.   She reports having frequent bouts of diarrhea due to colitis; she cannot pinpoint any triggering foods.   Physical activity: none recently; was walking up to 60 minutes 3 times a week at [facility]  Dietary Intake:  Usual eating pattern includes 2 meals and 0-1 snacks per day. Dining out frequency: 1-2 meals per week.  Breakfast: cereal; eggs 3x a week Snack: occ peanut butter crackers Lunch: 3-4pm combines lunch and supper -- 3/2 cheese dogs (cheese and bread, no meat); 3/1 pinto beans, leafy greens, cornbread; sandwich; rarely fried chicken but removes crust/ skin Snack: none; does like cottage cheese with fruit (much fruit triggers diarrhea) Supper: combo with lunch Snack: none Beverages: water 40oz; 1/2-1 c coffee with sweet n low and creamer; likes sweet tea with lemon when out; occ ginger ale or sprite  Nutrition Care Education: Topics covered:  Basic nutrition: basic food groups, appropriate nutrient balance, appropriate meal and snack schedule, general nutrition guidelines    Diabetes prevention:  appropriate meal and snack schedule, appropriate carb intake and balance, role of protein Hyperlipidemia:  target goals for lipids, healthy and unhealthy fats, benefits of soluble fiber if tolerated, plant sterols, role of exercise   Nutritional  Diagnosis:  [place]-2.2 Altered nutrition-related laboratory As related to hyperlipidemia, pre-diabetes, CKD.  As evidenced by elevated total cholesterol, LDL, and triglycerides, elevated HbA1C, low GFR. [place]-1.4 Altered GI function As related to lymphocytic colitis.  As evidenced by patient report of GI symptoms.  Intervention:  . Instruction and discussion as noted above. . Patient voices readiness to work on dietary changes to improve blood lipids and prevent diabetes, as well as improve GI distress.  . Established goals for some initial changes with input from patient.   Education Materials given:  Marland Kitchen Good Fats, Bad Fats (AHA) . Foods to Choose to Lower Cholesterol (AHA) . Plate Planner with food lists . Sample menus . Visit summary with goals/ instructions   Learner/ who was taught:  . Patient   Level of understanding: Marland Kitchen Verbalizes/ demonstrates competency   Demonstrated degree of understanding via:   Teach back Learning barriers: . None  Willingness to learn/ readiness for change: . Acceptance, ready for change  Monitoring and Evaluation:  Dietary intake, exercise, blood lipids, GI symptoms, and body weight      follow up: 05/23/20 at 1:30pm

## 2020-04-26 ENCOUNTER — Telehealth: Payer: Self-pay | Admitting: Family

## 2020-04-26 DIAGNOSIS — K52832 Lymphocytic colitis: Secondary | ICD-10-CM

## 2020-04-26 NOTE — Telephone Encounter (Signed)
Call pt I placed ref to dr Vicente Males for her to est care Let us know if you dont hear back within a week in regards to an appointment being scheduled.

## 2020-04-26 NOTE — Telephone Encounter (Signed)
Pt called and notified that referral was placed. She will [place] know if she does not hear back in the next week in regards to an appointment.

## 2020-05-22 ENCOUNTER — Encounter: Payer: Self-pay | Admitting: Gastroenterology

## 2020-05-22 ENCOUNTER — Ambulatory Visit: Payer: Medicare PPO | Admitting: Gastroenterology

## 2020-05-22 ENCOUNTER — Other Ambulatory Visit: Payer: Self-pay

## 2020-05-22 VITALS — BP 144/77 | HR 64 | Temp 98.5°F | Ht 67.0 in | Wt 130.1 lb

## 2020-05-22 DIAGNOSIS — K529 Noninfective gastroenteritis and colitis, unspecified: Secondary | ICD-10-CM | POA: Diagnosis not present

## 2020-05-22 DIAGNOSIS — K52832 Lymphocytic colitis: Secondary | ICD-10-CM | POA: Diagnosis not present

## 2020-05-22 MED ORDER — BUDESONIDE 3 MG PO CPEP: 9.0000 mg | ORAL_CAPSULE | Freq: Every day | ORAL | 3 refills | Status: DC

## 2020-05-22 NOTE — Progress Notes (Signed)
Cephas Darby, MD [address]  [place]  [place], [place] [address]  Main: [phone]  Fax: [phone]    Gastroenterology Consultation  Referring Provider:     Burnard Hawthorne, FNP Primary Care Physician:  Burnard Hawthorne, FNP Primary Gastroenterologist:  Dr. Harriette Bouillon at [facility] Reason for Consultation:     Lymphocytic colitis        HPI:   Mia Perez is a 87 y.o. female referred by Dr. Burnard Hawthorne, FNP  for consultation & management of lymphocytic colitis.  Patient reports that she has been diagnosed with lymphocytic colitis for more than 20 years, has been followed by Dr. Earlean Shawl for several years.  She has been on budesonide, last dose was about an year ago as her insurance did not approve.  She is currently taking Lomotil as needed.  She reports having good days and bad days.  She still struggles going out, eating out and has to plan a head which he is frustrating for her.  She has also lost about 10 pounds since October.  She does have abdominal bloating.  She denies any rectal bleeding.  She lives alone but has friends to socialize about once a week.  Her daughter lives about 10 miles from her, talks to her about twice a week.  She is functionally independent.  Her most recent labs were normal except for mild CKD.  She also reports that she was tested for celiac disease and it was negative  NSAIDs: None  Antiplts/Anticoagulants/Anti thrombotics: Aspirin 325 mg for history of DVT  GI Procedures: Colonoscopy by Dr. [facility]lean Shawl at [facility] in 2012 Reports having had an upper endoscopy several years ago  Past Medical History:  Diagnosis Date  . Anemia    H/O  . Arthritis   . Breast cancer ([place]) 02/10/1996   left, lumpectomy and radiation  . Breast cancer ([place]) 2017   right  . DVT (deep venous thrombosis) ([place]) 11/2014  . GERD (gastroesophageal reflux disease)   . Hypertension   . Lymphocytic colitis    . Personal history of radiation therapy 1998, 2017   BREAST CA  . Ulcerative colitis ([place]) 2014  . Varicose veins     Past Surgical History:  Procedure Laterality Date  . BREAST EXCISIONAL BIOPSY Right 2014   NEG  . BREAST LUMPECTOMY Left 1998   radiation  . BREAST LUMPECTOMY Right 2017  . BREAST LUMPECTOMY WITH SENTINEL LYMPH NODE BIOPSY Right 12/30/2015   8 mm, T1b, N0; RUOQ, ER 95%; PR: 80 %, Her 2 neu not overexpressed.Mammosite Radiation.  Marland Kitchen BREAST SURGERY Right 2014   Microcalcifications excised with wire localization/ Dr Bary Castilla  . COLONOSCOPY  2012   Dr Earlean Shawl  . DILATION AND CURETTAGE OF UTERUS    . HERNIA REPAIR    . HYSTEROSCOPY    . NECK SURGERY    . PERIPHERAL VASCULAR CATHETERIZATION N/A 12/13/2014   Procedure: IVC Filter Insertion;  Surgeon: Algernon Huxley, MD;  Location: [facility];  Service: Cardiovascular;  Laterality: N/A;  . PERIPHERAL VASCULAR CATHETERIZATION  12/13/2014   Procedure: Lower Extremity Intervention;  Surgeon: Algernon Huxley, MD;  Location: [facility];  Service: Cardiovascular;;  . PERIPHERAL VASCULAR CATHETERIZATION N/A 02/20/2015   Procedure: IVC Filter Removal;  Surgeon: Algernon Huxley, MD;  Location: [facility];  Service: Cardiovascular;  Laterality: N/A;    Current Outpatient Medications:  .  amLODipine (NORVASC) 2.5 MG tablet, TAKE 1 TABLET BY MOUTH DAILY. TAKE ANOTHER TABLET IN THE EVENING IF BLOOD PRESSURE > 145/90., Disp: 90 tablet, Rfl: 3 .  amLODipine (NORVASC) 5 MG tablet, TAKE 1 TABLET BY MOUTH EVERY DAY, Disp: 90 tablet, Rfl: 3 .  aspirin 325 MG tablet, Take 325 mg by mouth daily., Disp: , Rfl:  .  budesonide (ENTOCORT EC) 3 MG 24 hr capsule, Take 3 capsules (9 mg total) by mouth daily., Disp: 90 capsule, Rfl: 3 .  calcium gluconate 500 MG tablet, Take 1 tablet by mouth daily., Disp: , Rfl:  .  cholecalciferol (VITAMIN D) 25 MCG (1000 UNIT) tablet, Take 1,000 Units by mouth daily., Disp: , Rfl:  .   Cyanocobalamin (VITAMIN B-12) 5000 MCG TBDP, Take 5,000 mcg by mouth every other day. , Disp: , Rfl:  .  diphenoxylate-atropine (LOMOTIL) 2.5-0.025 MG tablet, TAKE 1 TABLET BY MOUTH 4 TIMES A DAY AS NEEDED FOR LOOSE STOOLS, Disp: 360 tablet, Rfl: 0 .  dorzolamidel-timolol (COSOPT) 22.3-6.8 MG/ML SOLN ophthalmic solution, Place 1 drop into both eyes 2 (two) times daily. Take one drop in both eyes in the morning & evening before bed., Disp: , Rfl:  .  letrozole (FEMARA) 2.5 MG tablet, TAKE 1 TABLET BY MOUTH DAILY, Disp: 90 tablet, Rfl: 4 .  losartan (COZAAR) 100 MG tablet, Take 1 tablet (100 mg total) by mouth every evening., Disp: 90 tablet, Rfl: 1 .  LUMIGAN 0.01 % SOLN, Place 1 drop into both eyes at bedtime. , Disp: , Rfl:  .  metoprolol succinate (TOPROL-XL) 100 MG 24 hr tablet, TAKE 1 TABLET (100 MG TOTAL) BY MOUTH DAILY. TAKE WITH OR IMMEDIATELY FOLLOWING A MEAL. APPT NEEDED, Disp: 90 tablet, Rfl: 3 .  Multiple Vitamins-Minerals (PRESERVISION AREDS 2 PO), Take 1 tablet by mouth 2 (two) times daily. , Disp: , Rfl:  .  simethicone (MYLICON) 258 MG chewable tablet, Chew 125 mg by mouth every morning., Disp: , Rfl:    Family History  Problem Relation Age of Onset  . Heart disease Mother   . Hypertension Mother   . Ovarian cancer Mother 3  . Hypertension Sister   . Hypertension Brother   . Ovarian cancer Paternal Aunt 85  . Breast cancer Neg Hx      Social History   Tobacco Use  . Smoking status: Never Smoker  . Smokeless tobacco: Never Used  Substance Use Topics  . Alcohol use: No  . Drug use: No    Allergies as of 05/22/2020 - Review Complete 05/22/2020  Allergen Reaction Noted  . Pravachol [pravastatin]  02/22/2019  . Penicillins Rash 02/04/2011    Review of Systems:    All systems reviewed and negative except where noted in HPI.   Physical Exam:  BP (!) 144/77 (BP Location: Left Arm, Patient Position: Sitting, Cuff Size: Normal)   Pulse 64   Temp 98.5 F (36.9 C)  (Oral)   Ht 5\' 7"  (1.702 m)   Wt 130 lb 2 oz (59 kg)   BMI 20.38 kg/m  No LMP recorded. Patient is postmenopausal.  General:   Alert, moderately built, moderately nourished, pleasant and cooperative in NAD Head:  Normocephalic and atraumatic. Eyes:  Sclera clear, no icterus.   Conjunctiva pink. Ears:  Normal auditory acuity. Nose:  No deformity, discharge, or lesions. Mouth:  No deformity or lesions,oropharynx pink & moist. Neck:  Supple; no masses or thyromegaly. Lungs:  Respirations even and unlabored.  Clear throughout to auscultation.  No wheezes, crackles, or rhonchi. No acute distress. Heart:  Regular rate and rhythm; no murmurs, clicks, rubs, or gallops. Abdomen:  Normal bowel sounds. Soft, non-tender and moderately distended, tympanic to percussion without masses, hepatosplenomegaly or hernias noted.  No guarding or rebound tenderness.   Rectal: Not performed Msk:  Symmetrical without gross deformities. Good, equal movement & strength bilaterally. Pulses:  Normal pulses noted. Extremities:  No clubbing or edema.  No cyanosis. Neurologic:  Alert and oriented x3;  grossly normal neurologically. Skin:  Intact without significant lesions or rashes. No jaundice. Lymph Nodes:  No significant cervical adenopathy. Psych:  Alert and cooperative. Normal mood and affect.  Imaging Studies: No abdominal imaging  Assessment and Plan:   NALAH MACIOCE is a 87 y.o. pleasant Caucasian female with history of lymphocytic colitis diagnosed in 2012, has been maintained on budesonide intermittently with good response, currently on Lomotil as needed.  History of weight loss  Will try for budesonide 3 mg 3 capsules daily Continue Lomotil as needed Rule out pancreatic insufficiency, check pancreatic fecal elastase levels Encouraged to eat high-protein foods I have discussed about sigmoidoscopy with repeat biopsies, patient is not interested at this time   Follow up in 1 month   Cephas Darby, MD

## 2020-05-23 ENCOUNTER — Telehealth: Payer: Self-pay

## 2020-05-23 ENCOUNTER — Encounter: Payer: Self-pay | Admitting: Dietician

## 2020-05-23 ENCOUNTER — Encounter: Payer: Medicare PPO | Attending: Family | Admitting: Dietician

## 2020-05-23 VITALS — Ht 67.0 in | Wt 131.0 lb

## 2020-05-23 DIAGNOSIS — Z79899 Other long term (current) drug therapy: Secondary | ICD-10-CM | POA: Diagnosis not present

## 2020-05-23 DIAGNOSIS — E785 Hyperlipidemia, unspecified: Secondary | ICD-10-CM | POA: Diagnosis present

## 2020-05-23 DIAGNOSIS — R7303 Prediabetes: Secondary | ICD-10-CM

## 2020-05-23 DIAGNOSIS — N183 Chronic kidney disease, stage 3 unspecified: Secondary | ICD-10-CM

## 2020-05-23 DIAGNOSIS — K52832 Lymphocytic colitis: Secondary | ICD-10-CM | POA: Insufficient documentation

## 2020-05-23 DIAGNOSIS — T466X5A Adverse effect of antihyperlipidemic and antiarteriosclerotic drugs, initial encounter: Secondary | ICD-10-CM | POA: Insufficient documentation

## 2020-05-23 NOTE — Telephone Encounter (Signed)
[employer] Approved medication until 02/08/2021

## 2020-05-23 NOTE — Progress Notes (Signed)
Medical Nutrition Therapy: Visit start time: 1330  end time: 1410  Assessment:  Diagnosis: hyperlipidemia, CKD, pre-diabetes  Medical history changes: no changes Psychosocial issues/ stress concerns: none  Current weight: 131lbs Height: 5'7" Medications, supplement changes: no changes per patient  Progress and evaluation:  . Patient reports having a "bad" week this week with diarrhea; up until 3:30am Sunday morning with diarrhea. She feels most bouts are random. . She has tried suggestions from previous visit , such as [place] sausage and lower fat cheese, plain rolls in place of biscuits . Common diarrhea triggers include cereal and fruit if too much over several days time.  . She reports often having poor appetite and therefore is having difficulty eating more than 2 meals daily with occasional snacks.  . She reports >8yr history of lymphocytic colitis without long term relief of symptoms, and does not feel improvement is likely at this point.   Physical activity: occasional walking  Dietary Intake:  Usual eating pattern includes 2 meals and 0-1 snacks per day. Dining out frequency: not assessed today  Breakfast: rice krispies cereal; grits and toast with low sugar spread; occasionally eggs Snack: none Lunch: 3-4pm chicken; sandwich with lean roast beef or ham; chicken and dumplings with canned chicken, fat free dumplings and broth (stopped cornbread with it); salmon or salad when outhas eaten pinto beans, green beans, cabbage incl slaw, brussels sprouts, sweet potato, tomato-cucumber salad with onion; likes stewed squash and zucchini, turnip and collard greens; homemade pizza on pita with lowfat mozzarella with onion, tomato, olives Snack: occasionally peanut butter on graham crackers or ritz Supper: none; combo afternoon meal with lunch Snack: none Beverages: water, tea with lemon, coffee 1/2 - 1c.  Nutrition Care Education: Topics covered:  Basic nutrition: appropriate meal and  snack schedule    Weight gain: eating frequently during the day; using liquid nutrition such as meal replacement drink; small amounts of healthy fats ie peanut butter, veg oils Hyperlipidemia: including some soluble fiber but limiting insoluble fiber to reduce risk for diarrhea Colitis:  Eating small amounts of food every 3-4 hours during the day; consuming fat and sugar in only small amounts at a time; choosing mostly low fiber foods  Nutritional Diagnosis:  [place]-2.2 Altered nutrition-related laboratory As related to hyperlipidemia, pre-diabetes, CKD.  As evidenced by elevated total cholesterol, LDL, and triglycerides, elevated HbA1C, low GFR. [place]-1.4 Altered GI function As related to lymphocytic colitis.  As evidenced by patient report of GI symptoms.  Intervention:  . Instruction and discussion as noted above. . Patient is making some positive dietary changes and is motivated to continue. Her primary goals are weight gain, lowering cholesterol, and improving GI symptom if possible.  . She declined additional MNT follow up at this time; will wait until next MD appt on 08/06/20 and determine need for more MNT after then.   Education Materials given:  Marland Kitchen Visit summary with goals/ instructions   Learner/ who was taught:  . Patient   Level of understanding: Marland Kitchen Verbalizes/ demonstrates competency   Demonstrated degree of understanding via:   Teach back Learning barriers: . None  Willingness to learn/ readiness for change: . Eager, change in progress   Monitoring and Evaluation:  Dietary intake, exercise, blood lipids, GI symptoms, renal function, blood sugar control, and body weight      follow up: prn

## 2020-05-23 NOTE — Telephone Encounter (Signed)
Did PA on Budesonide 3mg  through cover my meds. Waiting on response from insurance company

## 2020-05-23 NOTE — Patient Instructions (Signed)
   Add a protein or nutrition drink such as Glucerna, Glucose Control Boost, or Carnation Breakfast Essentials after dinner meal, about 7-8pm. OK to have 2 daily.   Try Fairlife milk for less sugar and more protein. It is also lactose free  Avoid higher fiber foods like greens, brussels sprouts, blackberries, strawberries. Stick with lower fiber vegetables and fruits like green beans, squash, peeled cucumber, peeled or canned peaches, peeled apple or applesauce, banana  Choose pinto beans or black beans with [place] foods, avoid refried beans because they are high in unhealthy fat.   OK to eat white rice and pasta instead of whole grain versions; the white versions might be easier on the digestive system.   Try to eat at least a snack every 3-4 hours during the day.

## 2020-06-03 LAB — PANCREATIC ELASTASE, FECAL: Pancreatic Elastase, Fecal: >500 ug Elast./g (ref >200)

## 2020-06-04 ENCOUNTER — Telehealth: Payer: Self-pay

## 2020-06-04 NOTE — Telephone Encounter (Signed)
Patient verbalized understanding of results  

## 2020-06-04 NOTE — Telephone Encounter (Signed)
-----   Message from Lin Landsman, MD sent at 06/03/2020  8:01 PM EDT ----- Normal stool studies for pancreatic function  RV

## 2020-06-19 ENCOUNTER — Other Ambulatory Visit: Payer: Self-pay

## 2020-06-19 ENCOUNTER — Encounter: Payer: Self-pay | Admitting: Gastroenterology

## 2020-06-19 ENCOUNTER — Ambulatory Visit: Payer: Medicare PPO | Admitting: Gastroenterology

## 2020-06-19 VITALS — BP 156/81 | HR 62 | Temp 97.7°F | Ht 67.0 in | Wt 134.2 lb

## 2020-06-19 DIAGNOSIS — K52832 Lymphocytic colitis: Secondary | ICD-10-CM | POA: Diagnosis not present

## 2020-06-19 NOTE — Progress Notes (Signed)
Cephas Darby, MD [address]  [place]  [place], [place] [address]  Main: [phone]  Fax: [phone]    Gastroenterology Consultation  Referring Provider:     Burnard Hawthorne, FNP Primary Care Physician:  Burnard Hawthorne, FNP Primary Gastroenterologist:  Dr. Harriette Bouillon at [facility] Reason for Consultation:     Lymphocytic colitis        HPI:   Mia Perez is a 87 y.o. female referred by Dr. Burnard Hawthorne, FNP  for consultation & management of lymphocytic colitis.  Patient reports that she has been diagnosed with lymphocytic colitis for more than 20 years, has been followed by Dr. Earlean Shawl for several years.  She has been on budesonide, last dose was about an year ago as her insurance did not approve.  She is currently taking Lomotil as needed.  She reports having good days and bad days.  She still struggles going out, eating out and has to plan a head which he is frustrating for her.  She has also lost about 10 pounds since October.  She does have abdominal bloating.  She denies any rectal bleeding.  She lives alone but has friends to socialize about once a week.  Her daughter lives about 10 miles from her, talks to her about twice a week.  She is functionally independent.  Her most recent labs were normal except for mild CKD.  She also reports that she was tested for celiac disease and it was negative  Follow-up visit 06/19/2020 Patient is here for follow-up of lymphocytic colitis.  She is currently on budesonide 9 mg daily.  She reports complete resolution of diarrhea in few days after starting the medication.  Currently having 1 formed bowel movement daily.  She totally feels like a normal person and is gaining weight.  She wants to know if she can restart cholesterol medication, ezetimibe as she is concerned about recurrence of diarrhea on this medication.  Patient has not needed Lomotil in last 1 month  NSAIDs:  None  Antiplts/Anticoagulants/Anti thrombotics: Aspirin 325 mg for history of DVT  GI Procedures: Colonoscopy by Dr. [facility]lean Shawl at [facility] in 2012 Reports having had an upper endoscopy several years ago  Past Medical History:  Diagnosis Date  . Anemia    H/O  . Arthritis   . Breast cancer ([place]) 02/10/1996   left, lumpectomy and radiation  . Breast cancer ([place]) 2017   right  . DVT (deep venous thrombosis) ([place]) 11/2014  . GERD (gastroesophageal reflux disease)   . Hypertension   . Lymphocytic colitis   . Personal history of radiation therapy 1998, 2017   BREAST CA  . Ulcerative colitis ([place]) 2014  . Varicose veins     Past Surgical History:  Procedure Laterality Date  . BREAST EXCISIONAL BIOPSY Right 2014   NEG  . BREAST LUMPECTOMY Left 1998   radiation  . BREAST LUMPECTOMY Right 2017  . BREAST LUMPECTOMY WITH SENTINEL LYMPH NODE BIOPSY Right 12/30/2015   8 mm, T1b, N0; RUOQ, ER 95%; PR: 80 %, Her 2 neu not overexpressed.Mammosite Radiation.  Marland Kitchen BREAST SURGERY Right 2014   Microcalcifications excised with wire localization/ Dr Bary Castilla  . COLONOSCOPY  2012   Dr Earlean Shawl  . DILATION AND CURETTAGE OF UTERUS    . HERNIA REPAIR    . HYSTEROSCOPY    . NECK SURGERY    . PERIPHERAL VASCULAR CATHETERIZATION N/A 12/13/2014   Procedure: IVC  Filter Insertion;  Surgeon: Algernon Huxley, MD;  Location: [facility];  Service: Cardiovascular;  Laterality: N/A;  . PERIPHERAL VASCULAR CATHETERIZATION  12/13/2014   Procedure: Lower Extremity Intervention;  Surgeon: Algernon Huxley, MD;  Location: [facility];  Service: Cardiovascular;;  . PERIPHERAL VASCULAR CATHETERIZATION N/A 02/20/2015   Procedure: IVC Filter Removal;  Surgeon: Algernon Huxley, MD;  Location: [facility];  Service: Cardiovascular;  Laterality: N/A;    Current Outpatient Medications:  .  amLODipine (NORVASC) 2.5 MG tablet, TAKE 1 TABLET BY MOUTH DAILY. TAKE ANOTHER TABLET IN THE EVENING IF BLOOD  PRESSURE > 145/90., Disp: 90 tablet, Rfl: 3 .  amLODipine (NORVASC) 5 MG tablet, TAKE 1 TABLET BY MOUTH EVERY DAY, Disp: 90 tablet, Rfl: 3 .  aspirin 325 MG tablet, Take 325 mg by mouth daily., Disp: , Rfl:  .  budesonide (ENTOCORT EC) 3 MG 24 hr capsule, Take 3 capsules (9 mg total) by mouth daily., Disp: 90 capsule, Rfl: 3 .  calcium gluconate 500 MG tablet, Take 1 tablet by mouth daily., Disp: , Rfl:  .  cholecalciferol (VITAMIN D) 25 MCG (1000 UNIT) tablet, Take 1,000 Units by mouth daily., Disp: , Rfl:  .  Cyanocobalamin (VITAMIN B-12) 5000 MCG TBDP, Take 5,000 mcg by mouth every other day. , Disp: , Rfl:  .  diphenoxylate-atropine (LOMOTIL) 2.5-0.025 MG tablet, TAKE 1 TABLET BY MOUTH 4 TIMES A DAY AS NEEDED FOR LOOSE STOOLS, Disp: 360 tablet, Rfl: 0 .  dorzolamidel-timolol (COSOPT) 22.3-6.8 MG/ML SOLN ophthalmic solution, Place 1 drop into both eyes 2 (two) times daily. Take one drop in both eyes in the morning & evening before bed., Disp: , Rfl:  .  letrozole (FEMARA) 2.5 MG tablet, TAKE 1 TABLET BY MOUTH DAILY, Disp: 90 tablet, Rfl: 4 .  losartan (COZAAR) 100 MG tablet, Take 1 tablet (100 mg total) by mouth every evening., Disp: 90 tablet, Rfl: 1 .  LUMIGAN 0.01 % SOLN, Place 1 drop into both eyes at bedtime. , Disp: , Rfl:  .  metoprolol succinate (TOPROL-XL) 100 MG 24 hr tablet, TAKE 1 TABLET (100 MG TOTAL) BY MOUTH DAILY. TAKE WITH OR IMMEDIATELY FOLLOWING A MEAL. APPT NEEDED, Disp: 90 tablet, Rfl: 3 .  Multiple Vitamins-Minerals (PRESERVISION AREDS 2 PO), Take 1 tablet by mouth 2 (two) times daily. , Disp: , Rfl:  .  simethicone (MYLICON) 176 MG chewable tablet, Chew 125 mg by mouth every morning., Disp: , Rfl:    Family History  Problem Relation Age of Onset  . Heart disease Mother   . Hypertension Mother   . Ovarian cancer Mother 44  . Hypertension Sister   . Hypertension Brother   . Ovarian cancer Paternal Aunt 85  . Breast cancer Neg Hx      Social History   Tobacco Use   . Smoking status: Never Smoker  . Smokeless tobacco: Never Used  Substance Use Topics  . Alcohol use: No  . Drug use: No    Allergies as of 06/19/2020 - Review Complete 06/19/2020  Allergen Reaction Noted  . Pravachol [pravastatin]  02/22/2019  . Penicillins Rash 02/04/2011    Review of Systems:    All systems reviewed and negative except where noted in HPI.   Physical Exam:  BP (!) 156/81 (BP Location: Left Arm, Patient Position: Sitting, Cuff Size: Normal)   Pulse 62   Temp 97.7 F (36.5 C) (Oral)   Ht 5\' 7"  (1.702 m)  Wt 134 lb 4 oz (60.9 kg)   BMI 21.03 kg/m  No LMP recorded. Patient is postmenopausal.  General:   Alert, moderately built, moderately nourished, pleasant and cooperative in NAD Head:  Normocephalic and atraumatic. Eyes:  Sclera clear, no icterus.   Conjunctiva pink. Ears:  Normal auditory acuity. Nose:  No deformity, discharge, or lesions. Mouth:  No deformity or lesions,oropharynx pink & moist. Neck:  Supple; no masses or thyromegaly. Lungs:  Respirations even and unlabored.  Clear throughout to auscultation.   No wheezes, crackles, or rhonchi. No acute distress. Heart:  Regular rate and rhythm; no murmurs, clicks, rubs, or gallops. Abdomen:  Normal bowel sounds. Soft, non-tender and non distended, without masses, hepatosplenomegaly or hernias noted.  No guarding or rebound tenderness.   Rectal: Not performed Msk:  Symmetrical without gross deformities. Good, equal movement & strength bilaterally. Pulses:  Normal pulses noted. Extremities:  No clubbing or edema.  No cyanosis. Neurologic:  Alert and oriented x3;  grossly normal neurologically. Skin:  Intact without significant lesions or rashes. No jaundice. Psych:  Alert and cooperative. Normal mood and affect.  Imaging Studies: No abdominal imaging  Assessment and Plan:   ATIA HAUPT is a 87 y.o. pleasant Caucasian female with history of lymphocytic colitis diagnosed in 2012, has been  maintained on budesonide intermittently with good response, currently on Lomotil as needed.  History of weight loss, which has resolved  Lymphocytic colitis: Diarrhea has currently resolved, On budesonide 3 mg 3 capsules daily, recommend to decrease to 2 capsules daily for 2 weeks and if not experiencing diarrhea, decreased to 1 pill a day Continue Lomotil as needed pancreatic fecal elastase levels normal Okay to restart ezetimibe 10 mg every other day or 5 mg daily and see if diarrhea recurs  Follow up in 3 to 4 months  Cephas Darby, MD

## 2020-08-05 ENCOUNTER — Telehealth: Payer: Self-pay | Admitting: Family

## 2020-08-05 NOTE — Telephone Encounter (Signed)
I called patient to get more info bc I felt that she needed to be seen. Ear ache has been going on for 3 plus weeks & sometimes makes throat sore on that right side. She wears ear plugs when she showers to keep water out then dries her ears out of her hair dryer. Since she has this ear ache going on for sao long she took an at home Covid test & was negative. She has no congestion, cough, sob, fatigue, runny nose. Would it be okay if she came in or better for ger to do a telephone & refer her elsewhere to have ear evaluated? Just wanted to run by you since she this has been going on for some time now.

## 2020-08-05 NOTE — Telephone Encounter (Signed)
PT called in to inform that she has been having a earache for the past 3 weeks and would like to make her apt a telephone call that way she doesn't come in and spread whatever she has to [place] and her little one. She would like to be called at [phone] tomorrow for her apt.

## 2020-08-05 NOTE — Telephone Encounter (Signed)
Call pt As long as covid home test negative and taken at least 5 days after symptom onset, I am very happy to see her in person and look into her ears  I think in person would be better . If she adamant to not come, we can attempt virtual however I recommend in person

## 2020-08-05 NOTE — Telephone Encounter (Signed)
Pt will come into office. She has had earache for 3 plus weeks & took at home Covid test 6 days ago bc she wanted to be sure with ear making right side of throat sore at times. Test negative & no other symptoms.

## 2020-08-05 NOTE — Telephone Encounter (Signed)
Appt scheduled tomorrow

## 2020-08-06 ENCOUNTER — Ambulatory Visit: Payer: Medicare PPO | Admitting: Family

## 2020-08-06 ENCOUNTER — Other Ambulatory Visit: Payer: Self-pay

## 2020-08-06 ENCOUNTER — Encounter: Payer: Self-pay | Admitting: Family

## 2020-08-06 VITALS — BP 124/64 | HR 69 | Temp 98.1°F | Ht 67.0 in | Wt 137.0 lb

## 2020-08-06 DIAGNOSIS — K52832 Lymphocytic colitis: Secondary | ICD-10-CM | POA: Diagnosis not present

## 2020-08-06 DIAGNOSIS — H9202 Otalgia, left ear: Secondary | ICD-10-CM | POA: Diagnosis not present

## 2020-08-06 DIAGNOSIS — E785 Hyperlipidemia, unspecified: Secondary | ICD-10-CM | POA: Diagnosis not present

## 2020-08-06 HISTORY — DX: Otalgia, left ear: H92.02

## 2020-08-06 MED ORDER — MOMETASONE FUROATE 0.1 % EX CREA: 1.0000 "application " | TOPICAL_CREAM | Freq: Every day | CUTANEOUS | 2 refills | Status: DC

## 2020-08-06 MED ORDER — EZETIMIBE 10 MG PO TABS: 10.0000 mg | ORAL_TABLET | ORAL | 3 refills | Status: DC

## 2020-08-06 MED ORDER — FLUTICASONE PROPIONATE 50 MCG/ACT NA SUSP: 2.0000 | Freq: Every day | NASAL | 6 refills | Status: DC

## 2020-08-06 NOTE — Progress Notes (Signed)
Subjective:    [name]: Mia Perez, female    DOB: Oct 10, 1933, 87 y.o.   MRN: [id number]  CC: Mia Perez is a 87 y.o. female who presents today for an acute visit and follow up    HPI: Left sided ear pain x 3 weeks, some improvement  No drainage, rash, fever, cough, facial pain, vision changes, hearing changes, vertigo, sore throat, popping in ears.  Mild seasonable allergies  She has been using neomycin, polymyxin, and hydrocortisone otic solution prn with relief of ear pain.  She has been wearing ear plugs in ears when in shower HLD- compliant with zetia 10mg  QOD HTN- compliant with losartan 100mg , metoprolol 100mg  XR, amlodipine 7.5 mgdaily Following with Dr Marius Ditch for lymphocytic colitis . She hasnt had diarrhea in one month and she feels like 'a new person'. She is very pleased.    HISTORY:  Past Medical History:  Diagnosis Date   Anemia    H/O   Arthritis    Breast cancer ([place]) 02/10/1996   left, lumpectomy and radiation   Breast cancer ([place]) 2017   right   DVT (deep venous thrombosis) ([place]) 11/2014   GERD (gastroesophageal reflux disease)    Hypertension    Lymphocytic colitis    Personal history of radiation therapy 1998, 2017   BREAST CA   Ulcerative colitis ([place]) 2014   Varicose veins    Past Surgical History:  Procedure Laterality Date   BREAST EXCISIONAL BIOPSY Right 2014   NEG   BREAST LUMPECTOMY Left 1998   radiation   BREAST LUMPECTOMY Right 2017   BREAST LUMPECTOMY WITH SENTINEL LYMPH NODE BIOPSY Right 12/30/2015   8 mm, T1b, N0; RUOQ, ER 95%; PR: 80 %, Her 2 neu not overexpressed.Mammosite Radiation.   BREAST SURGERY Right 2014   Microcalcifications excised with wire localization/ Dr Bary Castilla   COLONOSCOPY  2012   Dr Earlean Shawl   DILATION AND CURETTAGE OF UTERUS     HERNIA REPAIR     HYSTEROSCOPY     NECK SURGERY     PERIPHERAL VASCULAR CATHETERIZATION N/A 12/13/2014   Procedure: IVC Filter Insertion;  Surgeon: Algernon Huxley, MD;   Location: [facility];  Service: Cardiovascular;  Laterality: N/A;   PERIPHERAL VASCULAR CATHETERIZATION  12/13/2014   Procedure: Lower Extremity Intervention;  Surgeon: Algernon Huxley, MD;  Location: [facility];  Service: Cardiovascular;;   PERIPHERAL VASCULAR CATHETERIZATION N/A 02/20/2015   Procedure: IVC Filter Removal;  Surgeon: Algernon Huxley, MD;  Location: [facility];  Service: Cardiovascular;  Laterality: N/A;   Family History  Problem Relation Age of Onset   Heart disease Mother    Hypertension Mother    Ovarian cancer Mother 25   Hypertension Sister    Hypertension Brother    Ovarian cancer Paternal Aunt 85   Breast cancer Neg Hx     Allergies: Pravachol [pravastatin] and Penicillins Current Outpatient Medications on File Prior to Visit  Medication Sig Dispense Refill   amLODipine (NORVASC) 2.5 MG tablet TAKE 1 TABLET BY MOUTH DAILY. TAKE ANOTHER TABLET IN THE EVENING IF BLOOD PRESSURE > 145/90. 90 tablet 3   amLODipine (NORVASC) 5 MG tablet TAKE 1 TABLET BY MOUTH EVERY DAY 90 tablet 3   aspirin 325 MG tablet Take 325 mg by mouth daily.     budesonide (ENTOCORT EC) 3 MG 24 hr capsule Take 3 capsules (9 mg total) by mouth daily. 90 capsule 3   calcium gluconate  500 MG tablet Take 1 tablet by mouth daily.     cholecalciferol (VITAMIN D) 25 MCG (1000 UNIT) tablet Take 1,000 Units by mouth daily.     Cyanocobalamin (VITAMIN B-12) 5000 MCG TBDP Take 5,000 mcg by mouth every other day.      dorzolamidel-timolol (COSOPT) 22.3-6.8 MG/ML SOLN ophthalmic solution Place 1 drop into both eyes 2 (two) times daily. Take one drop in both eyes in the morning & evening before bed.     letrozole (FEMARA) 2.5 MG tablet TAKE 1 TABLET BY MOUTH DAILY 90 tablet 4   losartan (COZAAR) 100 MG tablet Take 1 tablet (100 mg total) by mouth every evening. 90 tablet 1   LUMIGAN 0.01 % SOLN Place 1 drop into both eyes at bedtime.      metoprolol succinate (TOPROL-XL) 100 MG 24 hr  tablet TAKE 1 TABLET (100 MG TOTAL) BY MOUTH DAILY. TAKE WITH OR IMMEDIATELY FOLLOWING A MEAL. APPT NEEDED 90 tablet 3   Multiple Vitamins-Minerals (PRESERVISION AREDS 2 PO) Take 1 tablet by mouth 2 (two) times daily.      simethicone (MYLICON) 798 MG chewable tablet Chew 125 mg by mouth every morning.     No current facility-administered medications on file prior to visit.    Social History   Tobacco Use   Smoking status: Never   Smokeless tobacco: Never  Substance Use Topics   Alcohol use: No   Drug use: No    Review of Systems  Constitutional:  Negative for chills and fever.  HENT:  Positive for ear pain and hearing loss. Negative for ear discharge, facial swelling and postnasal drip.   Respiratory:  Negative for cough.   Cardiovascular:  Negative for chest pain and palpitations.  Gastrointestinal:  Negative for nausea and vomiting.     Objective:    BP 124/64 (BP Location: Left Arm, Patient Position: Sitting, Cuff Size: Large)   Pulse 69   Temp 98.1 F (36.7 C) (Oral)   Ht 5\' 7"  (1.702 m)   Wt 137 lb (62.1 kg)   SpO2 99%   BMI 21.46 kg/m    Physical Exam Vitals reviewed.  Constitutional:      Appearance: She is well-developed.  HENT:     Head: Normocephalic and atraumatic.     Right Ear: Tympanic membrane, ear canal and external ear normal. Decreased hearing noted. No drainage, swelling or tenderness. No middle ear effusion. No foreign body. Tympanic membrane is not erythematous or bulging.     Left Ear: Tympanic membrane, ear canal and external ear normal. Decreased hearing noted. No drainage, swelling or tenderness.  No middle ear effusion. No foreign body. Tympanic membrane is not injected, scarred, perforated, erythematous, retracted or bulging.     Ears:     Comments: Unable to hear my rubbing fingers on exam bilaterally.  Dry flakes in external left ear canal. No discharge. No pain with palpation or pulling of external left ear.     Nose: Nose normal. No  rhinorrhea.     Right Sinus: No maxillary sinus tenderness or frontal sinus tenderness.     Left Sinus: No maxillary sinus tenderness or frontal sinus tenderness.     Mouth/Throat:     Pharynx: Uvula midline. No oropharyngeal exudate or posterior oropharyngeal erythema.     Tonsils: No tonsillar abscesses.  Eyes:     Conjunctiva/sclera: Conjunctivae normal.  Cardiovascular:     Rate and Rhythm: Regular rhythm.     Pulses: Normal pulses.  Heart sounds: Normal heart sounds.  Pulmonary:     Effort: Pulmonary effort is normal.     Breath sounds: Normal breath sounds. No wheezing, rhonchi or rales.  Lymphadenopathy:     Head:     Right side of head: No submental, submandibular, tonsillar, preauricular, posterior auricular or occipital adenopathy.     Left side of head: No submental, submandibular, tonsillar, preauricular, posterior auricular or occipital adenopathy.     Cervical: No cervical adenopathy.  Skin:    General: Skin is warm and dry.  Neurological:     Mental Status: She is alert.  Psychiatric:        Speech: Speech normal.        Behavior: Behavior normal.        Thought Content: Thought content normal.       Assessment & Plan:   Problem List Items Addressed This Visit       Digestive   Lymphocytic colitis    Chronic, stable and improved. Continue to follow with Dr Donald Siva and Auditory   Acute otalgia, left - Primary    Benign exam. No ear pain today. Discussed subtle decreased hearing on exam and advised ENT consult; she declines.  Symptoms otherwise consistent with eustachian tube dysfunction and atopic dermatitis of ear canal. Start flonase , elocon. If no improvement will arrange consult with ENT       Relevant Medications   fluticasone (FLONASE) 50 MCG/ACT nasal spray   mometasone (ELOCON) 0.1 % cream     Other   Hyperlipidemia, unspecified    Uncontrolled. She is willing to trial zetia 10mg  qod to see if colitis remains under good  control       Relevant Medications   ezetimibe (ZETIA) 10 MG tablet      I have discontinued Medrith B. Barno's diphenoxylate-atropine. I am also having her start on ezetimibe, fluticasone, and mometasone. Additionally, I am having her maintain her Lumigan, aspirin, Multiple Vitamins-Minerals (PRESERVISION AREDS 2 PO), simethicone, Vitamin B-12, letrozole, dorzolamidel-timolol, cholecalciferol, calcium gluconate, amLODipine, amLODipine, losartan, metoprolol succinate, and budesonide.   Meds ordered this encounter  Medications   ezetimibe (ZETIA) 10 MG tablet    Sig: Take 1 tablet (10 mg total) by mouth every other day.    Dispense:  90 tablet    Refill:  3    Order Specific Question:   Supervising Provider    Answer:   Deborra Medina L [[id number]]   fluticasone (FLONASE) 50 MCG/ACT nasal spray    Sig: Place 2 sprays into both nostrils daily.    Dispense:  16 g    Refill:  6    Order Specific Question:   Supervising Provider    Answer:   Deborra Medina L [[id number]]   mometasone (ELOCON) 0.1 % cream    Sig: Apply 1 application topically daily. Appear pea sized ( or less) amount to external ear, and slightly in canal.    Dispense:  15 g    Refill:  2    Order Specific Question:   Supervising Provider    Answer:   Crecencio Mc [[id number]]     Return precautions given.   Risks, benefits, and alternatives of the medications and treatment plan prescribed today were discussed, and patient expressed understanding.   Education regarding symptom management and diagnosis given to patient on AVS.  Continue to follow with Burnard Hawthorne, FNP for routine health maintenance.   Mia Perez  and I agreed with plan.   Mable Paris, FNP

## 2020-08-06 NOTE — Assessment & Plan Note (Signed)
Chronic, stable and improved. Continue to follow with Dr Marius Ditch

## 2020-08-06 NOTE — Assessment & Plan Note (Signed)
Uncontrolled. She is willing to trial zetia 10mg  qod to see if colitis remains under good control

## 2020-08-06 NOTE — Assessment & Plan Note (Signed)
Benign exam. No ear pain today. Discussed subtle decreased hearing on exam and advised ENT consult; she declines.  Symptoms otherwise consistent with eustachian tube dysfunction and atopic dermatitis of ear canal. Start flonase , elocon. If no improvement will arrange consult with ENT

## 2020-08-23 ENCOUNTER — Other Ambulatory Visit: Payer: Self-pay | Admitting: Gastroenterology

## 2020-09-01 ENCOUNTER — Other Ambulatory Visit: Payer: Self-pay | Admitting: Family

## 2020-09-01 DIAGNOSIS — I10 Essential (primary) hypertension: Secondary | ICD-10-CM

## 2020-09-25 ENCOUNTER — Ambulatory Visit: Payer: Medicare PPO | Admitting: Gastroenterology

## 2020-09-25 ENCOUNTER — Other Ambulatory Visit: Payer: Self-pay

## 2020-09-25 ENCOUNTER — Encounter: Payer: Self-pay | Admitting: Gastroenterology

## 2020-09-25 VITALS — BP 138/77 | HR 66 | Temp 98.0°F | Ht 67.0 in | Wt 145.4 lb

## 2020-09-25 DIAGNOSIS — K52832 Lymphocytic colitis: Secondary | ICD-10-CM

## 2020-09-25 NOTE — Progress Notes (Signed)
Mia Darby, MD [address]  [place]  [place], [place] [address]  Main: [phone]  Fax: [phone]    Gastroenterology Consultation  Referring Provider:     Burnard Hawthorne, FNP Primary Care Physician:  Burnard Hawthorne, FNP Primary Gastroenterologist:  Dr. Harriette Bouillon at [facility] Reason for Consultation:     Lymphocytic colitis        HPI:   Mia Perez is a 87 y.o. female referred by Dr. Burnard Hawthorne, FNP  for consultation & management of lymphocytic colitis.  Patient reports that she has been diagnosed with lymphocytic colitis for more than 20 years, has been followed by Dr. Earlean Shawl for several years.  She has been on budesonide, last dose was about an year ago as her insurance did not approve.  She is currently taking Lomotil as needed.  She reports having good days and bad days.  She still struggles going out, eating out and has to plan a head which he is frustrating for her.  She has also lost about 10 pounds since October.  She does have abdominal bloating.  She denies any rectal bleeding.  She lives alone but has friends to socialize about once a week.  Her daughter lives about 10 miles from her, talks to her about twice a week.  She is functionally independent.  Her most recent labs were normal except for mild CKD.  She also reports that she was tested for celiac disease and it was negative  Follow-up visit 06/19/2020 Patient is here for follow-up of lymphocytic colitis.  She is currently on budesonide 9 mg daily.  She reports complete resolution of diarrhea in few days after starting the medication.  Currently having 1 formed bowel movement daily.  She totally feels like a normal person and is gaining weight.  She wants to know if she can restart cholesterol medication, ezetimibe as she is concerned about recurrence of diarrhea on this medication.  Patient has not needed Lomotil in last 1 month  Follow-up visit  09/25/2020 Patient is here for follow-up of lymphocytic colitis.  She reports feeling great.  She does not need to worry about looking for bathroom close by when she goes out.  She is currently on budesonide 1 mg daily.  She is also taking ezetimibe 10 mg daily and feels good.  Reports having 1 bowel movement daily.  She does not have any concerns today.  In fact, she gained few pounds  NSAIDs: None  Antiplts/Anticoagulants/Anti thrombotics: Aspirin 325 mg for history of DVT  GI Procedures: Colonoscopy by Dr. Earlean Shawl at [facility] in 2012 Reports having had an upper endoscopy several years ago  Past Medical History:  Diagnosis Date   Anemia    H/O   Arthritis    Breast cancer ([place]) 02/10/1996   left, lumpectomy and radiation   Breast cancer ([place]) 2017   right   DVT (deep venous thrombosis) ([place] [place]) 11/2014   GERD (gastroesophageal reflux disease)    Hypertension    Lymphocytic colitis    Personal history of radiation therapy 1998, 2017   BREAST CA   Ulcerative colitis ([place]) 2014   Varicose veins     Past Surgical History:  Procedure Laterality Date   BREAST EXCISIONAL BIOPSY Right 2014   NEG   BREAST LUMPECTOMY Left 1998   radiation   BREAST LUMPECTOMY Right 2017   BREAST LUMPECTOMY WITH SENTINEL LYMPH NODE BIOPSY Right 12/30/2015  8 mm, T1b, N0; RUOQ, ER 95%; PR: 80 %, Her 2 neu not overexpressed.Mammosite Radiation.   BREAST SURGERY Right 2014   Microcalcifications excised with wire localization/ Dr Bary Castilla   COLONOSCOPY  2012   Dr Earlean Shawl   DILATION AND CURETTAGE OF UTERUS     HERNIA REPAIR     HYSTEROSCOPY     NECK SURGERY     PERIPHERAL VASCULAR CATHETERIZATION N/A 12/13/2014   Procedure: IVC Filter Insertion;  Surgeon: Algernon Huxley, MD;  Location: [facility];  Service: Cardiovascular;  Laterality: N/A;   PERIPHERAL VASCULAR CATHETERIZATION  12/13/2014   Procedure: Lower Extremity Intervention;  Surgeon: Algernon Huxley, MD;  Location: [facility];   Service: Cardiovascular;;   PERIPHERAL VASCULAR CATHETERIZATION N/A 02/20/2015   Procedure: IVC Filter Removal;  Surgeon: Algernon Huxley, MD;  Location: [facility];  Service: Cardiovascular;  Laterality: N/A;    Current Outpatient Medications:    amLODipine (NORVASC) 2.5 MG tablet, TAKE 1 TABLET BY MOUTH DAILY. TAKE ANOTHER TABLET IN THE EVENING IF BLOOD PRESSURE > 145/90., Disp: 90 tablet, Rfl: 3   amLODipine (NORVASC) 5 MG tablet, TAKE 1 TABLET BY MOUTH EVERY DAY, Disp: 90 tablet, Rfl: 3   aspirin 325 MG tablet, Take 325 mg by mouth daily., Disp: , Rfl:    budesonide (ENTOCORT EC) 3 MG 24 hr capsule, TAKE 3 CAPSULES (9 MG TOTAL) BY MOUTH DAILY., Disp: 270 capsule, Rfl: 4   calcium gluconate 500 MG tablet, Take 1 tablet by mouth daily., Disp: , Rfl:    cholecalciferol (VITAMIN D) 25 MCG (1000 UNIT) tablet, Take 1,000 Units by mouth daily., Disp: , Rfl:    Cyanocobalamin (VITAMIN B-12) 5000 MCG TBDP, Take 5,000 mcg by mouth every other day. , Disp: , Rfl:    dorzolamidel-timolol (COSOPT) 22.3-6.8 MG/ML SOLN ophthalmic solution, Place 1 drop into both eyes 2 (two) times daily. Take one drop in both eyes in the morning & evening before bed., Disp: , Rfl:    ezetimibe (ZETIA) 10 MG tablet, Take 1 tablet (10 mg total) by mouth every other day., Disp: 90 tablet, Rfl: 3   fluticasone (FLONASE) 50 MCG/ACT nasal spray, Place 2 sprays into both nostrils daily., Disp: 16 g, Rfl: 6   letrozole (FEMARA) 2.5 MG tablet, TAKE 1 TABLET BY MOUTH DAILY, Disp: 90 tablet, Rfl: 4   losartan (COZAAR) 100 MG tablet, TAKE 1 TABLET BY MOUTH EVERY EVENING., Disp: 90 tablet, Rfl: 1   LUMIGAN 0.01 % SOLN, Place 1 drop into both eyes at bedtime. , Disp: , Rfl:    metoprolol succinate (TOPROL-XL) 100 MG 24 hr tablet, TAKE 1 TABLET (100 MG TOTAL) BY MOUTH DAILY. TAKE WITH OR IMMEDIATELY FOLLOWING A MEAL. APPT NEEDED, Disp: 90 tablet, Rfl: 3   mometasone (ELOCON) 0.1 % cream, Apply 1 application topically daily. Appear  pea sized ( or less) amount to external ear, and slightly in canal., Disp: 15 g, Rfl: 2   Multiple Vitamins-Minerals (PRESERVISION AREDS 2 PO), Take 1 tablet by mouth 2 (two) times daily. , Disp: , Rfl:    simethicone (MYLICON) 0000000 MG chewable tablet, Chew 125 mg by mouth every morning., Disp: , Rfl:    Family History  Problem Relation Age of Onset   Heart disease Mother    Hypertension Mother    Ovarian cancer Mother 15   Hypertension Sister    Hypertension Brother    Ovarian cancer Paternal Aunt 85   Breast cancer Neg  Hx      Social History   Tobacco Use   Smoking status: Never   Smokeless tobacco: Never  Substance Use Topics   Alcohol use: No   Drug use: No    Allergies as of 09/25/2020 - Review Complete 09/25/2020  Allergen Reaction Noted   Pravachol [pravastatin]  02/22/2019   Penicillins Rash 02/04/2011    Review of Systems:    All systems reviewed and negative except where noted in HPI.   Physical Exam:  BP 138/77 (BP Location: Left Arm, Patient Position: Sitting, Cuff Size: Normal)   Pulse 66   Temp 98 F (36.7 C) (Oral)   Ht '5\' 7"'$  (1.702 m)   Wt 145 lb 6 oz (65.9 kg)   BMI 22.77 kg/m  No LMP recorded. Patient is postmenopausal.  General:   Alert, moderately built, moderately nourished, pleasant and cooperative in NAD Head:  Normocephalic and atraumatic. Eyes:  Sclera clear, no icterus.   Conjunctiva pink. Ears:  Normal auditory acuity. Nose:  No deformity, discharge, or lesions. Mouth:  No deformity or lesions,oropharynx pink & moist. Neck:  Supple; no masses or thyromegaly. Lungs:  Respirations even and unlabored.  Clear throughout to auscultation.   No wheezes, crackles, or rhonchi. No acute distress. Heart:  Regular rate and rhythm; no murmurs, clicks, rubs, or gallops. Abdomen:  Normal bowel sounds. Soft, non-tender and non distended, without masses, hepatosplenomegaly or hernias noted.  No guarding or rebound tenderness.   Rectal: Not  performed Msk:  Symmetrical without gross deformities. Good, equal movement & strength bilaterally. Pulses:  Normal pulses noted. Extremities:  No clubbing or edema.  No cyanosis. Neurologic:  Alert and oriented x3;  grossly normal neurologically. Skin:  Intact without significant lesions or rashes. No jaundice. Psych:  Alert and cooperative. Normal mood and affect.  Imaging Studies: No abdominal imaging  Assessment and Plan:   MARABETH LEMIRE is a 87 y.o. pleasant Caucasian female with history of lymphocytic colitis diagnosed in 2012, has been maintained on budesonide intermittently with good response, currently on Lomotil as needed.  History of weight loss, which has resolved.  Patient is gaining weight  Lymphocytic colitis: Diarrhea has currently resolved, Currently maintained on budesonide 3 mg daily, continue the current dose Continue Lomotil as needed pancreatic fecal elastase levels normal   Follow up in 6 months  Mia Darby, MD

## 2020-10-07 DIAGNOSIS — N281 Cyst of kidney, acquired: Secondary | ICD-10-CM | POA: Diagnosis not present

## 2020-10-07 DIAGNOSIS — R809 Proteinuria, unspecified: Secondary | ICD-10-CM | POA: Diagnosis not present

## 2020-10-07 DIAGNOSIS — N1832 Chronic kidney disease, stage 3b: Secondary | ICD-10-CM | POA: Diagnosis not present

## 2020-10-07 DIAGNOSIS — R82998 Other abnormal findings in urine: Secondary | ICD-10-CM | POA: Diagnosis not present

## 2020-10-07 DIAGNOSIS — I10 Essential (primary) hypertension: Secondary | ICD-10-CM | POA: Diagnosis not present

## 2020-10-15 DIAGNOSIS — N281 Cyst of kidney, acquired: Secondary | ICD-10-CM | POA: Diagnosis not present

## 2020-10-15 DIAGNOSIS — N1832 Chronic kidney disease, stage 3b: Secondary | ICD-10-CM | POA: Diagnosis not present

## 2020-10-15 DIAGNOSIS — R600 Localized edema: Secondary | ICD-10-CM | POA: Diagnosis not present

## 2020-10-15 DIAGNOSIS — I10 Essential (primary) hypertension: Secondary | ICD-10-CM | POA: Diagnosis not present

## 2020-10-15 DIAGNOSIS — R809 Proteinuria, unspecified: Secondary | ICD-10-CM | POA: Diagnosis not present

## 2020-11-15 ENCOUNTER — Ambulatory Visit: Payer: Medicare PPO | Admitting: Family

## 2020-11-18 ENCOUNTER — Other Ambulatory Visit: Payer: Self-pay

## 2020-11-18 ENCOUNTER — Encounter: Payer: Self-pay | Admitting: Family

## 2020-11-18 ENCOUNTER — Ambulatory Visit: Payer: Medicare PPO | Admitting: Family

## 2020-11-18 ENCOUNTER — Telehealth: Payer: Self-pay | Admitting: Family

## 2020-11-18 VITALS — BP 132/68 | HR 60 | Temp 98.1°F | Ht 67.0 in | Wt 146.0 lb

## 2020-11-18 DIAGNOSIS — I10 Essential (primary) hypertension: Secondary | ICD-10-CM

## 2020-11-18 DIAGNOSIS — Z23 Encounter for immunization: Secondary | ICD-10-CM | POA: Diagnosis not present

## 2020-11-18 DIAGNOSIS — I87009 Postthrombotic syndrome without complications of unspecified extremity: Secondary | ICD-10-CM

## 2020-11-18 DIAGNOSIS — E785 Hyperlipidemia, unspecified: Secondary | ICD-10-CM | POA: Diagnosis not present

## 2020-11-18 LAB — BASIC METABOLIC PANEL
BUN: 29 mg/dL — ABNORMAL HIGH (ref 6–23)
CO2: 27 mEq/L (ref 19–32)
Calcium: 9.2 mg/dL (ref 8.4–10.5)
Chloride: 106 mEq/L (ref 96–112)
Creatinine, Ser: 1.3 mg/dL — ABNORMAL HIGH (ref 0.40–1.20)
GFR: 37.69 mL/min — ABNORMAL LOW (ref >60.00)
Glucose, Bld: 85 mg/dL (ref 70–99)
Potassium: 4.4 mEq/L (ref 3.5–5.1)
Sodium: 141 mEq/L (ref 135–145)

## 2020-11-18 MED ORDER — EZETIMIBE 10 MG PO TABS: 10.0000 mg | ORAL_TABLET | Freq: Every day | ORAL | 3 refills | Status: DC

## 2020-11-18 MED ORDER — HYDRALAZINE HCL 10 MG PO TABS: 10.0000 mg | ORAL_TABLET | Freq: Three times a day (TID) | ORAL | 2 refills | Status: DC

## 2020-11-18 NOTE — Progress Notes (Signed)
Subjective:    [name]: Mia Perez, female    DOB: [birth year], 87 y.o.   MRN: [id number]  CC: Mia Perez is a 87 y.o. female who presents today for follow up.   HPI: She reports two instances in the past month in which she took additional dose of amlodipine 2.5mg  and then blood pressure didn't respond, so she took another dose 2.5mg  . At those time BP 180/ 81. No leg swelling, CP, sob. In the past she had hydralazine 25 mg TID PRN. No salt indiscretion.    CKD- following with Dr [name] HLD- compliant with zetia 10mg  increased to daily now.  HTN- compliant with toprol 100mg  qam, losartan 100mg  qam, amlodipine 7.5mg  late morning,  and will take additional dose 2.5mg   if BP > 145 . Added torsemide 10mg  prn for leg swelling by nephrology; she has needed every day.   H/o DVT, post phlebitic syndrome- compliant with asa 352mg . No bleeding.  Labs 10/07/20 Renal function 1.22, no anemia.    HISTORY:  Past Medical History:  Diagnosis Date   Anemia    H/O   Arthritis    Breast cancer ([place]) 02/10/1996   left, lumpectomy and radiation   Breast cancer ([place]) 2017   right   DVT (deep venous thrombosis) ([place]) 11/2014   GERD (gastroesophageal reflux disease)    Hypertension    Lymphocytic colitis    Personal history of radiation therapy 1998, 2017   BREAST CA   Ulcerative colitis ([place]) 2014   Varicose veins    Past Surgical History:  Procedure Laterality Date   BREAST EXCISIONAL BIOPSY Right 2014   NEG   BREAST LUMPECTOMY Left 1998   radiation   BREAST LUMPECTOMY Right 2017   BREAST LUMPECTOMY WITH SENTINEL LYMPH NODE BIOPSY Right 12/30/2015   8 mm, T1b, N0; RUOQ, ER 95%; PR: 80 %, Her 2 neu not overexpressed.Mammosite Radiation.   BREAST SURGERY Right 2014   Microcalcifications excised with wire localization/ Dr Bary Castilla   COLONOSCOPY  2012   Dr Earlean Shawl   DILATION AND CURETTAGE OF UTERUS     HERNIA REPAIR     HYSTEROSCOPY     NECK SURGERY     PERIPHERAL VASCULAR  CATHETERIZATION N/A 12/13/2014   Procedure: IVC Filter Insertion;  Surgeon: Algernon Huxley, MD;  Location: [facility];  Service: Cardiovascular;  Laterality: N/A;   PERIPHERAL VASCULAR CATHETERIZATION  12/13/2014   Procedure: Lower Extremity Intervention;  Surgeon: Algernon Huxley, MD;  Location: [facility];  Service: Cardiovascular;;   PERIPHERAL VASCULAR CATHETERIZATION N/A 02/20/2015   Procedure: IVC Filter Removal;  Surgeon: Algernon Huxley, MD;  Location: [facility];  Service: Cardiovascular;  Laterality: N/A;   Family History  Problem Relation Age of Onset   Heart disease Mother    Hypertension Mother    Ovarian cancer Mother 49   Hypertension Sister    Hypertension Brother    Ovarian cancer Paternal Aunt 85   Breast cancer Neg Hx     Allergies: Pravachol [pravastatin] and Penicillins Current Outpatient Medications on File Prior to Visit  Medication Sig Dispense Refill   amLODipine (NORVASC) 5 MG tablet TAKE 1 TABLET BY MOUTH EVERY DAY 90 tablet 3   aspirin 325 MG tablet Take 325 mg by mouth daily.     budesonide (ENTOCORT EC) 3 MG 24 hr capsule TAKE 3 CAPSULES (9 MG TOTAL) BY MOUTH DAILY. 270 capsule 4   calcium gluconate 500 MG  tablet Take 1 tablet by mouth daily.     cholecalciferol (VITAMIN D) 25 MCG (1000 UNIT) tablet Take 1,000 Units by mouth daily.     Cyanocobalamin (VITAMIN B-12) 5000 MCG TBDP Take 5,000 mcg by mouth every other day.      dorzolamidel-timolol (COSOPT) 22.3-6.8 MG/ML SOLN ophthalmic solution Place 1 drop into both eyes 2 (two) times daily. Take one drop in both eyes in the morning & evening before bed.     fluticasone (FLONASE) 50 MCG/ACT nasal spray Place 2 sprays into both nostrils daily. 16 g 6   letrozole (FEMARA) 2.5 MG tablet TAKE 1 TABLET BY MOUTH DAILY 90 tablet 4   losartan (COZAAR) 100 MG tablet TAKE 1 TABLET BY MOUTH EVERY EVENING. 90 tablet 1   LUMIGAN 0.01 % SOLN Place 1 drop into both eyes at bedtime.      metoprolol  succinate (TOPROL-XL) 100 MG 24 hr tablet TAKE 1 TABLET (100 MG TOTAL) BY MOUTH DAILY. TAKE WITH OR IMMEDIATELY FOLLOWING A MEAL. APPT NEEDED 90 tablet 3   mometasone (ELOCON) 0.1 % cream Apply 1 application topically daily. Appear pea sized ( or less) amount to external ear, and slightly in canal. 15 g 2   Multiple Vitamins-Minerals (PRESERVISION AREDS 2 PO) Take 1 tablet by mouth 2 (two) times daily.      simethicone (MYLICON) 564 MG chewable tablet Chew 125 mg by mouth every morning.     torsemide (DEMADEX) 10 MG tablet Take 1 tablet by mouth daily as needed.     No current facility-administered medications on file prior to visit.    Social History   Tobacco Use   Smoking status: Never   Smokeless tobacco: Never  Substance Use Topics   Alcohol use: No   Drug use: No    Review of Systems  Constitutional:  Negative for chills and fever.  Respiratory:  Negative for cough.   Cardiovascular:  Negative for chest pain and palpitations.  Gastrointestinal:  Negative for nausea and vomiting.     Objective:    BP 132/68 (BP Location: Left Arm, Patient Position: Sitting)   Pulse 60   Temp 98.1 F (36.7 C) (Oral)   Ht 5\' 7"  (1.702 m)   Wt 146 lb (66.2 kg)   SpO2 97%   BMI 22.87 kg/m  BP Readings from Last 3 Encounters:  11/18/20 132/68  09/25/20 138/77  08/06/20 124/64   Wt Readings from Last 3 Encounters:  11/18/20 146 lb (66.2 kg)  09/25/20 145 lb 6 oz (65.9 kg)  08/06/20 137 lb (62.1 kg)    Physical Exam Vitals reviewed.  Constitutional:      Appearance: She is well-developed.  Eyes:     Conjunctiva/sclera: Conjunctivae normal.  Cardiovascular:     Rate and Rhythm: Normal rate and regular rhythm.     Pulses: Normal pulses.     Heart sounds: Normal heart sounds.  Pulmonary:     Effort: Pulmonary effort is normal.     Breath sounds: Normal breath sounds. No wheezing, rhonchi or rales.  Skin:    General: Skin is warm and dry.  Neurological:     Mental Status: She  is alert.  Psychiatric:        Speech: Speech normal.        Behavior: Behavior normal.        Thought Content: Thought content normal.       Assessment & Plan:   Problem List Items Addressed This Visit  Cardiovascular and Mediastinum   Essential hypertension - Primary    Excellent control today.  Patient expressed concern over lability at home.  Consulted via secure chat Dr Murlean Iba regarding decreasing amlodipine from 7.5 mg to 5 mg and starting hydralazine 10 mg 3 times daily.  She will continue losartan 100 mg, Toprol 100 mg.  Also advised her during her visit today to take losartan 100 mg qpm  to separate her blood pressure medications for better control.  Advised as well with the reduced dose of amlodipine, she may hopefully resume using torsemide 10 mg as needed versus daily for leg swelling.       Relevant Medications   torsemide (DEMADEX) 10 MG tablet   ezetimibe (ZETIA) 10 MG tablet   Other Relevant Orders   Basic metabolic panel (Completed)   Post-phlebitic syndrome    Chronic, stable. Continue asa 81mg .       Relevant Medications   torsemide (DEMADEX) 10 MG tablet   ezetimibe (ZETIA) 10 MG tablet     Other   Hyperlipidemia, unspecified    Anticipate improved.  Continue Zetia 10 mg daily      Relevant Medications   torsemide (DEMADEX) 10 MG tablet   ezetimibe (ZETIA) 10 MG tablet   Other Visit Diagnoses     Need for immunization against influenza       Relevant Orders   Flu Vaccine QUAD High Dose(Fluad) (Completed)        I have changed Mia Perez's ezetimibe. I am also having her maintain her Lumigan, aspirin, Multiple Vitamins-Minerals (PRESERVISION AREDS 2 PO), simethicone, Vitamin B-12, letrozole, dorzolamidel-timolol, cholecalciferol, calcium gluconate, amLODipine, metoprolol succinate, fluticasone, mometasone, budesonide, losartan, and torsemide.   Meds ordered this encounter  Medications   ezetimibe (ZETIA) 10 MG tablet     Sig: Take 1 tablet (10 mg total) by mouth daily.    Dispense:  90 tablet    Refill:  3    Order Specific Question:   Supervising Provider    Answer:   Crecencio Mc [[id number]]     Return precautions given.   Risks, benefits, and alternatives of the medications and treatment plan prescribed today were discussed, and patient expressed understanding.   Education regarding symptom management and diagnosis given to patient on AVS.  Continue to follow with Burnard Hawthorne, FNP for routine health maintenance.   Mia Perez and I agreed with plan.   Mable Paris, FNP

## 2020-11-18 NOTE — Assessment & Plan Note (Addendum)
Anticipate improved.  Continue Zetia 10 mg daily

## 2020-11-18 NOTE — Patient Instructions (Signed)
Lets trial taking losartan 100mg  in evening/ bedtime. You may continue toprol 100mg  and amlodipine 7.5mg  in the morning.   I will consult Dr [name] regarding hydralazine for elevated blood pressure.

## 2020-11-18 NOTE — Assessment & Plan Note (Signed)
Chronic, stable. Continue asa 81mg .

## 2020-11-18 NOTE — Telephone Encounter (Signed)
I spoke with patient & she is aware that she is to pick up the hydralazine TID & stop the 2.5 mg Amlodipine. She will continue the 5 mg of amlodipine, metoprolol 100 mg & losartan 100 mg. She will take the losartan in the evening as well. I also advised that her ankle swelling may go down with the reduced dose of amlodipine & that she may not need daily.

## 2020-11-18 NOTE — Assessment & Plan Note (Addendum)
Excellent control today.  Patient expressed concern over lability at home.  Consulted via secure chat Dr Murlean Iba regarding decreasing amlodipine from 7.5 mg to 5 mg and starting hydralazine 10 mg 3 times daily.  She will continue losartan 100 mg, Toprol 100 mg.  Also advised her during her visit today to take losartan 100 mg qpm  to separate her blood pressure medications for better control.  Advised as well with the reduced dose of amlodipine, she may hopefully resume using torsemide 10 mg as needed versus daily for leg swelling.

## 2020-11-18 NOTE — Telephone Encounter (Signed)
Call pt   Consulted via secure chat Dr Murlean Iba regarding decreasing amlodipine from 7.5 mg to 5 mg and starting hydralazine 10 mg 3 times daily. HE AGREED  Advise pt I sent in hydralazine 10 mg TID  She will continue losartan 100 mg, Toprol 100 mg.  Also advised her during her visit today to take losartan 100 mg in the evening to separate her blood pressure medications.    Advised as well with the reduced dose of amlodipine, she may hopefully resume using torsemide 10 mg as needed versus daily for leg swelling.

## 2020-11-22 DIAGNOSIS — D2261 Melanocytic nevi of right upper limb, including shoulder: Secondary | ICD-10-CM | POA: Diagnosis not present

## 2020-11-22 DIAGNOSIS — D2271 Melanocytic nevi of right lower limb, including hip: Secondary | ICD-10-CM | POA: Diagnosis not present

## 2020-11-22 DIAGNOSIS — L448 Other specified papulosquamous disorders: Secondary | ICD-10-CM | POA: Diagnosis not present

## 2020-11-22 DIAGNOSIS — X32XXXA Exposure to sunlight, initial encounter: Secondary | ICD-10-CM | POA: Diagnosis not present

## 2020-11-22 DIAGNOSIS — D485 Neoplasm of uncertain behavior of skin: Secondary | ICD-10-CM | POA: Diagnosis not present

## 2020-11-22 DIAGNOSIS — L570 Actinic keratosis: Secondary | ICD-10-CM | POA: Diagnosis not present

## 2020-11-22 DIAGNOSIS — D0359 Melanoma in situ of other part of trunk: Secondary | ICD-10-CM | POA: Diagnosis not present

## 2020-11-22 DIAGNOSIS — Z85828 Personal history of other malignant neoplasm of skin: Secondary | ICD-10-CM | POA: Diagnosis not present

## 2020-11-22 DIAGNOSIS — D2262 Melanocytic nevi of left upper limb, including shoulder: Secondary | ICD-10-CM | POA: Diagnosis not present

## 2020-12-01 ENCOUNTER — Other Ambulatory Visit: Payer: Self-pay | Admitting: Family

## 2020-12-01 DIAGNOSIS — I10 Essential (primary) hypertension: Secondary | ICD-10-CM

## 2020-12-02 ENCOUNTER — Other Ambulatory Visit: Payer: Self-pay

## 2020-12-02 ENCOUNTER — Ambulatory Visit: Payer: Medicare PPO

## 2020-12-11 ENCOUNTER — Telehealth (INDEPENDENT_AMBULATORY_CARE_PROVIDER_SITE_OTHER): Payer: Self-pay | Admitting: Vascular Surgery

## 2020-12-11 NOTE — Telephone Encounter (Signed)
pt called - having Right from knee up - when sitting - feels inflammed. when stands, limps, not putting a lot of pressure on it. States that she had a clot in years past (can't remember year). Worried she has a clot again. Pt takes 325 mg asprin daily due to insurance not approving Eliquis. Please advise [phone]

## 2020-12-11 NOTE — Telephone Encounter (Signed)
She can come in for an ultrasound only appt today or tomorrow morning if there is availability.  If the patient has a DVT we can work her in.  If it is negative, the patient should see her PCP for further work up.  If we don't have availability she should reach out to her PCP

## 2020-12-12 ENCOUNTER — Ambulatory Visit (INDEPENDENT_AMBULATORY_CARE_PROVIDER_SITE_OTHER): Payer: Medicare PPO

## 2020-12-12 ENCOUNTER — Other Ambulatory Visit (INDEPENDENT_AMBULATORY_CARE_PROVIDER_SITE_OTHER): Payer: Self-pay | Admitting: Nurse Practitioner

## 2020-12-12 ENCOUNTER — Other Ambulatory Visit: Payer: Self-pay

## 2020-12-12 DIAGNOSIS — M79604 Pain in right leg: Secondary | ICD-10-CM

## 2020-12-18 ENCOUNTER — Other Ambulatory Visit: Payer: Self-pay | Admitting: General Surgery

## 2020-12-18 DIAGNOSIS — C50919 Malignant neoplasm of unspecified site of unspecified female breast: Secondary | ICD-10-CM

## 2020-12-27 DIAGNOSIS — D0352 Melanoma in situ of breast (skin) (soft tissue): Secondary | ICD-10-CM | POA: Diagnosis not present

## 2020-12-27 DIAGNOSIS — L905 Scar conditions and fibrosis of skin: Secondary | ICD-10-CM | POA: Diagnosis not present

## 2021-01-01 ENCOUNTER — Other Ambulatory Visit: Payer: Self-pay

## 2021-01-01 ENCOUNTER — Encounter: Payer: Self-pay | Admitting: Family

## 2021-01-01 ENCOUNTER — Ambulatory Visit: Payer: Medicare PPO | Admitting: Family

## 2021-01-01 DIAGNOSIS — Z86718 Personal history of other venous thrombosis and embolism: Secondary | ICD-10-CM

## 2021-01-01 DIAGNOSIS — R600 Localized edema: Secondary | ICD-10-CM | POA: Diagnosis not present

## 2021-01-01 DIAGNOSIS — I10 Essential (primary) hypertension: Secondary | ICD-10-CM

## 2021-01-01 NOTE — Patient Instructions (Addendum)
Take amlodipine 5mg  in the morning and resume amlodipine 2.5mg  in the evening when you take the losartan ( so easy to remember) . Maximum total daily of amlodipine is 10mg .   Continue hydralazine, toprol as prescribed.   It is imperative that you are seen AT least twice per year for labs and monitoring. Monitor blood pressure at home and me 5-6 reading on separate days. Goal is less than  140/80, based on newest guidelines, ;  if persistently higher, please make sooner follow up appointment so we can recheck you blood pressure and manage/ adjust medications.  Nice to see you!   Managing Your Hypertension Hypertension, also called high blood pressure, is when the force of the blood pressing against the walls of the arteries is too strong. Arteries are blood vessels that carry blood from your heart throughout your body. Hypertension forces the heart to work harder to pump blood and may cause the arteries to become narrow or stiff. Understanding blood pressure readings Your personal target blood pressure may vary depending on your medical conditions, your age, and other factors. A blood pressure reading includes a higher number over a lower number. Ideally, your blood pressure should be below 120/80. You should know that: The first, or top, number is called the systolic pressure. It is a measure of the pressure in your arteries as your heart beats. The second, or bottom number, is called the diastolic pressure. It is a measure of the pressure in your arteries as the heart relaxes. Blood pressure is classified into four stages. Based on your blood pressure reading, your health care provider may use the following stages to determine what type of treatment you need, if any. Systolic pressure and diastolic pressure are measured in a unit called mmHg. Normal Systolic pressure: below 944. Diastolic pressure: below 80. Elevated Systolic pressure: 967-591. Diastolic pressure: below 80. Hypertension stage  1 Systolic pressure: 638-466. Diastolic pressure: 59-93. Hypertension stage 2 Systolic pressure: 570 or above. Diastolic pressure: 90 or above. How can this condition affect me? Managing your hypertension is an important responsibility. Over time, hypertension can damage the arteries and decrease blood flow to important parts of the body, including the brain, heart, and kidneys. Having untreated or uncontrolled hypertension can lead to: A heart attack. A stroke. A weakened blood vessel (aneurysm). Heart failure. Kidney damage. Eye damage. Metabolic syndrome. Memory and concentration problems. Vascular dementia. What actions can I take to manage this condition? Hypertension can be managed by making lifestyle changes and possibly by taking medicines. Your health care provider will help you make a plan to bring your blood pressure within a normal range. Nutrition  Eat a diet that is high in fiber and potassium, and low in salt (sodium), added sugar, and fat. An example eating plan is called the Dietary Approaches to Stop Hypertension (DASH) diet. To eat this way: Eat plenty of fresh fruits and vegetables. Try to fill one-half of your plate at each meal with fruits and vegetables. Eat whole grains, such as whole-wheat pasta, brown rice, or whole-grain bread. Fill about one-fourth of your plate with whole grains. Eat low-fat dairy products. Avoid fatty cuts of meat, processed or cured meats, and poultry with skin. Fill about one-fourth of your plate with lean proteins such as fish, chicken without skin, beans, eggs, and tofu. Avoid pre-made and processed foods. These tend to be higher in sodium, added sugar, and fat. Reduce your daily sodium intake. Most people with hypertension should eat less than 1,500 mg  of sodium a day. Lifestyle  Work with your health care provider to maintain a healthy body weight or to lose weight. Ask what an ideal weight is for you. Get at least 30 minutes of  exercise that causes your heart to beat faster (aerobic exercise) most days of the week. Activities may include walking, swimming, or biking. Include exercise to strengthen your muscles (resistance exercise), such as weight lifting, as part of your weekly exercise routine. Try to do these types of exercises for 30 minutes at least 3 days a week. Do not use any products that contain nicotine or tobacco, such as cigarettes, e-cigarettes, and chewing tobacco. If you need help quitting, ask your health care provider. Control any long-term (chronic) conditions you have, such as high cholesterol or diabetes. Identify your sources of stress and find ways to manage stress. This may include meditation, deep breathing, or making time for fun activities. Alcohol use Do not drink alcohol if: Your health care provider tells you not to drink. You are pregnant, may be pregnant, or are planning to become pregnant. If you drink alcohol: Limit how much you use to: 0-1 drink a day for women. 0-2 drinks a day for men. Be aware of how much alcohol is in your drink. In the [place] one drink equals one 12 oz bottle of beer (355 mL), one 5 oz glass of wine (148 mL), or one 1 oz glass of hard liquor (44 mL). Medicines Your health care provider may prescribe medicine if lifestyle changes are not enough to get your blood pressure under control and if: Your systolic blood pressure is 130 or higher. Your diastolic blood pressure is 80 or higher. Take medicines only as told by your health care provider. Follow the directions carefully. Blood pressure medicines must be taken as told by your health care provider. The medicine does not work as well when you skip doses. Skipping doses also puts you at risk for problems. Monitoring Before you monitor your blood pressure: Do not smoke, drink caffeinated beverages, or exercise within 30 minutes before taking a measurement. Use the bathroom and empty your bladder (urinate). Sit  quietly for at least 5 minutes before taking measurements. Monitor your blood pressure at home as told by your health care provider. To do this: Sit with your back straight and supported. Place your feet flat on the floor. Do not cross your legs. Support your arm on a flat surface, such as a table. Make sure your upper arm is at heart level. Each time you measure, take two or three readings one minute apart and record the results. You may also need to have your blood pressure checked regularly by your health care provider. General information Talk with your health care provider about your diet, exercise habits, and other lifestyle factors that may be contributing to hypertension. Review all the medicines you take with your health care provider because there may be side effects or interactions. Keep all visits as told by your health care provider. Your health care provider can help you create and adjust your plan for managing your high blood pressure. Where to find more information National Heart, Lung, and Blood Institute: https://wilson-eaton.com/ American Heart Association: www.heart.org Contact a health care provider if: You think you are having a reaction to medicines you have taken. You have repeated (recurrent) headaches. You feel dizzy. You have swelling in your ankles. You have trouble with your vision. Get help right away if: You develop a severe headache or confusion. You  have unusual weakness or numbness, or you feel faint. You have severe pain in your chest or abdomen. You vomit repeatedly. You have trouble breathing. These symptoms may represent a serious problem that is an emergency. Do not wait to see if the symptoms will go away. Get medical help right away. Call your local emergency services (911 in the [place] Do not drive yourself to the hospital. Summary Hypertension is when the force of blood pumping through your arteries is too strong. If this condition is not controlled, it  may put you at risk for serious complications. Your personal target blood pressure may vary depending on your medical conditions, your age, and other factors. For most people, a normal blood pressure is less than 120/80. Hypertension is managed by lifestyle changes, medicines, or both. Lifestyle changes to help manage hypertension include losing weight, eating a healthy, low-sodium diet, exercising more, stopping smoking, and limiting alcohol. This information is not intended to replace advice given to you by your health care provider. Make sure you discuss any questions you have with your health care provider. Document Revised: 02/13/2019 Document Reviewed: 12/27/2018 Elsevier Patient Education  2022 [employer].

## 2021-01-01 NOTE — Assessment & Plan Note (Signed)
Compliant with ASA 325mg . No bleeding.

## 2021-01-01 NOTE — Assessment & Plan Note (Signed)
Chronic, stable. Continue torsemide 10mg  for now. May consider HCTZ in place of.

## 2021-01-01 NOTE — Progress Notes (Signed)
Subjective:    [name]: Mia Perez, female    DOB: 1933-07-25, 87 y.o.   MRN: [id number]  CC: Mia Perez is a 87 y.o. female who presents today for follow up.   HPI: Feels well today  No complaints  HTN- At home 130-140/ 60-70. Her log reads 129/64, 139/66, 131/64, 147/78, compliant with amlodipine 5 mg, hydralazine 10 mg 3 times daily, losartan 100 mg qpm, Toprol 100 mg. She notes 8 episodes in which BP escalated to 145/68, 155/71.  She typically checks her blood pressure has been late evening hours.   Compliant with asa 325mg . No bleeding, epigastric discomfort.   Leg swelling- Swelling in legs in unchanged and is not a bother to her. She is taking torsemide 10mg    CKD- follow up with Dr [name] 04/23/21    HISTORY:  Past Medical History:  Diagnosis Date   Anemia    H/O   Arthritis    Breast cancer ([place]) 02/10/1996   left, lumpectomy and radiation   Breast cancer ([place]) 2017   right   DVT (deep venous thrombosis) ([place]) 11/2014   GERD (gastroesophageal reflux disease)    Hypertension    Lymphocytic colitis    Personal history of radiation therapy 1998, 2017   BREAST CA   Ulcerative colitis ([place]) 2014   Varicose veins    Past Surgical History:  Procedure Laterality Date   BREAST EXCISIONAL BIOPSY Right 2014   NEG   BREAST LUMPECTOMY Left 1998   radiation   BREAST LUMPECTOMY Right 2017   BREAST LUMPECTOMY WITH SENTINEL LYMPH NODE BIOPSY Right 12/30/2015   8 mm, T1b, N0; RUOQ, ER 95%; PR: 80 %, Her 2 neu not overexpressed.Mammosite Radiation.   BREAST SURGERY Right 2014   Microcalcifications excised with wire localization/ Dr Bary Castilla   COLONOSCOPY  2012   Dr Earlean Shawl   DILATION AND CURETTAGE OF UTERUS     HERNIA REPAIR     HYSTEROSCOPY     MELANOMA EXCISION     middle of chest between breasts, Dr Kellie Moor   NECK SURGERY     PERIPHERAL VASCULAR CATHETERIZATION N/A 12/13/2014   Procedure: IVC Filter Insertion;  Surgeon: Algernon Huxley, MD;  Location:  [facility];  Service: Cardiovascular;  Laterality: N/A;   PERIPHERAL VASCULAR CATHETERIZATION  12/13/2014   Procedure: Lower Extremity Intervention;  Surgeon: Algernon Huxley, MD;  Location: [facility];  Service: Cardiovascular;;   PERIPHERAL VASCULAR CATHETERIZATION N/A 02/20/2015   Procedure: IVC Filter Removal;  Surgeon: Algernon Huxley, MD;  Location: [facility];  Service: Cardiovascular;  Laterality: N/A;   Family History  Problem Relation Age of Onset   Heart disease Mother    Hypertension Mother    Ovarian cancer Mother 12   Hypertension Sister    Hypertension Brother    Ovarian cancer Paternal Aunt 85   Breast cancer Neg Hx     Allergies: Pravachol [pravastatin] and Penicillins Current Outpatient Medications on File Prior to Visit  Medication Sig Dispense Refill   amLODipine (NORVASC) 5 MG tablet TAKE 1 TABLET BY MOUTH EVERY DAY 90 tablet 3   aspirin 325 MG tablet Take 325 mg by mouth daily.     budesonide (ENTOCORT EC) 3 MG 24 hr capsule TAKE 3 CAPSULES (9 MG TOTAL) BY MOUTH DAILY. 270 capsule 4   calcium gluconate 500 MG tablet Take 1 tablet by mouth daily.     cholecalciferol (VITAMIN D) 25 MCG (1000 UNIT)  tablet Take 1,000 Units by mouth daily.     Cyanocobalamin (VITAMIN B-12) 5000 MCG TBDP Take 5,000 mcg by mouth every other day.      dorzolamidel-timolol (COSOPT) 22.3-6.8 MG/ML SOLN ophthalmic solution Place 1 drop into both eyes 2 (two) times daily. Take one drop in both eyes in the morning & evening before bed.     ezetimibe (ZETIA) 10 MG tablet Take 1 tablet (10 mg total) by mouth daily. 90 tablet 3   fluticasone (FLONASE) 50 MCG/ACT nasal spray Place 2 sprays into both nostrils daily. 16 g 6   hydrALAZINE (APRESOLINE) 10 MG tablet Take 1 tablet (10 mg total) by mouth 3 (three) times daily. 180 tablet 2   letrozole (FEMARA) 2.5 MG tablet TAKE 1 TABLET BY MOUTH DAILY 90 tablet 4   losartan (COZAAR) 100 MG tablet TAKE 1 TABLET BY MOUTH EVERY  EVENING. 90 tablet 1   LUMIGAN 0.01 % SOLN Place 1 drop into both eyes at bedtime.      metoprolol succinate (TOPROL-XL) 100 MG 24 hr tablet TAKE 1 TABLET (100 MG TOTAL) BY MOUTH DAILY. TAKE WITH OR IMMEDIATELY FOLLOWING A MEAL. APPT NEEDED 90 tablet 3   Multiple Vitamins-Minerals (PRESERVISION AREDS 2 PO) Take 1 tablet by mouth 2 (two) times daily.      simethicone (MYLICON) 767 MG chewable tablet Chew 125 mg by mouth every morning.     torsemide (DEMADEX) 10 MG tablet Take 1 tablet by mouth daily as needed.     mometasone (ELOCON) 0.1 % cream Apply 1 application topically daily. Appear pea sized ( or less) amount to external ear, and slightly in canal. (Patient not taking: Reported on 01/01/2021) 15 g 2   No current facility-administered medications on file prior to visit.    Social History   Tobacco Use   Smoking status: Never   Smokeless tobacco: Never  Substance Use Topics   Alcohol use: No   Drug use: No    Review of Systems  Constitutional:  Negative for chills and fever.  Respiratory:  Negative for cough.   Cardiovascular:  Positive for leg swelling. Negative for chest pain and palpitations.  Gastrointestinal:  Negative for nausea and vomiting.     Objective:    BP 140/80 (BP Location: Left Arm, Patient Position: Sitting, Cuff Size: Normal)   Pulse (!) 58   Temp 98.3 F (36.8 C) (Temporal)   Wt 149 lb 6.4 oz (67.8 kg)   SpO2 98%   BMI 23.40 kg/m  BP Readings from Last 3 Encounters:  01/01/21 140/80  11/18/20 132/68  09/25/20 138/77   Wt Readings from Last 3 Encounters:  01/01/21 149 lb 6.4 oz (67.8 kg)  11/18/20 146 lb (66.2 kg)  09/25/20 145 lb 6 oz (65.9 kg)    Physical Exam Vitals reviewed.  Constitutional:      Appearance: She is well-developed.  Eyes:     Conjunctiva/sclera: Conjunctivae normal.  Cardiovascular:     Rate and Rhythm: Normal rate and regular rhythm.     Pulses: Normal pulses.     Heart sounds: Normal heart sounds.     Comments:  Trace bilateral +1 edema with very trivial pitting Pulmonary:     Effort: Pulmonary effort is normal.     Breath sounds: Normal breath sounds. No wheezing, rhonchi or rales.  Musculoskeletal:     Right lower leg: Edema present.     Left lower leg: Edema present.  Skin:    General: Skin is warm and  dry.  Neurological:     Mental Status: She is alert.  Psychiatric:        Speech: Speech normal.        Behavior: Behavior normal.        Thought Content: Thought content normal.       Assessment & Plan:   Problem List Items Addressed This Visit       Cardiovascular and Mediastinum   Essential hypertension    Labile however average of readings at home at goal < 135/80. She will increase amlodipine to 5mg  qam and new dose of 2.5mg  qpm.will trial with split dose to see if better controlled.  If leg swelling were to become bothersome or worse, will opt to either change losartan to telmisartan or torsemide 10mg  to HCTZ 12.5mg .  Continue hydralazine 10 mg 3 times daily, losartan 100 mg qpm, Toprol 100 mg. Close follow up.         Other   Edema of lower extremity    Chronic, stable. Continue torsemide 10mg  for now. May consider HCTZ in place of.       History of DVT (deep vein thrombosis)    Compliant with ASA 325mg . No bleeding.         I am having Mia Perez maintain her Lumigan, aspirin, Multiple Vitamins-Minerals (PRESERVISION AREDS 2 PO), simethicone, Vitamin B-12, letrozole, dorzolamidel-timolol, cholecalciferol, calcium gluconate, amLODipine, metoprolol succinate, fluticasone, mometasone, budesonide, losartan, torsemide, ezetimibe, and hydrALAZINE.   No orders of the defined types were placed in this encounter.   Return precautions given.   Risks, benefits, and alternatives of the medications and treatment plan prescribed today were discussed, and patient expressed understanding.   Education regarding symptom management and diagnosis given to patient on  AVS.  Continue to follow with Burnard Hawthorne, FNP for routine health maintenance.   Mia Perez and I agreed with plan.   Mable Paris, FNP

## 2021-01-01 NOTE — Assessment & Plan Note (Addendum)
Labile however average of readings at home at goal < 135/80. She will increase amlodipine to 5mg  qam and new dose of 2.5mg  qpm.will trial with split dose to see if better controlled.  If leg swelling were to become bothersome or worse, will opt to either change losartan to telmisartan or torsemide 10mg  to HCTZ 12.5mg .  Continue hydralazine 10 mg 3 times daily, losartan 100 mg qpm, Toprol 100 mg. Close follow up.

## 2021-01-09 ENCOUNTER — Ambulatory Visit: Payer: Medicare PPO

## 2021-01-22 DIAGNOSIS — H401132 Primary open-angle glaucoma, bilateral, moderate stage: Secondary | ICD-10-CM | POA: Diagnosis not present

## 2021-01-24 ENCOUNTER — Encounter: Payer: Self-pay | Admitting: Family

## 2021-01-24 ENCOUNTER — Ambulatory Visit: Payer: Medicare PPO | Admitting: Family

## 2021-01-24 ENCOUNTER — Other Ambulatory Visit: Payer: Self-pay

## 2021-01-24 VITALS — BP 120/72 | HR 65 | Ht 67.01 in | Wt 153.2 lb

## 2021-01-24 DIAGNOSIS — I10 Essential (primary) hypertension: Secondary | ICD-10-CM

## 2021-01-24 DIAGNOSIS — R600 Localized edema: Secondary | ICD-10-CM

## 2021-01-24 MED ORDER — AMLODIPINE BESYLATE 5 MG PO TABS: 5.0000 mg | ORAL_TABLET | Freq: Every day | ORAL | 3 refills | Status: DC

## 2021-01-24 MED ORDER — AMLODIPINE BESYLATE 2.5 MG PO TABS: 2.5000 mg | ORAL_TABLET | Freq: Every day | ORAL | 3 refills | Status: DC

## 2021-01-24 NOTE — Progress Notes (Signed)
Subjective:    [name]: Mia Perez, female    DOB: 01-Feb-1934, 87 y.o.   MRN: [id number]  CC: Mia Perez is a 87 y.o. female who presents today for follow up.   HPI: She feel well today. No new complaints  She notes BP has been labile still however no particularly high reading . , 159/ 70, however this is unusual. Average at home 130-140/70.      Hypertension-compliant with amlodipine 5 mg every morning, 2.5 mg every afternoon,hydralazine 10 mg 3 times daily, losartan 100 mg qpm, Toprol 100 mg. No cp, sob.   Leg swelling-controlled. She is wearing compression stockings with improvement. compliant with torsemide 10 mg prn, mostly every other to every third day.   She is established with nephrology follow-up for CKD.  Follow-up scheduled for march 2023.   HISTORY:  Past Medical History:  Diagnosis Date   Anemia    H/O   Arthritis    Breast cancer ([place]) 02/10/1996   left, lumpectomy and radiation   Breast cancer ([place]) 2017   right   DVT (deep venous thrombosis) ([place]) 11/2014   GERD (gastroesophageal reflux disease)    Hypertension    Lymphocytic colitis    Personal history of radiation therapy 1998, 2017   BREAST CA   Ulcerative colitis ([place]) 2014   Varicose veins    Past Surgical History:  Procedure Laterality Date   BREAST EXCISIONAL BIOPSY Right 2014   NEG   BREAST LUMPECTOMY Left 1998   radiation   BREAST LUMPECTOMY Right 2017   BREAST LUMPECTOMY WITH SENTINEL LYMPH NODE BIOPSY Right 12/30/2015   8 mm, T1b, N0; RUOQ, ER 95%; PR: 80 %, Her 2 neu not overexpressed.Mammosite Radiation.   BREAST SURGERY Right 2014   Microcalcifications excised with wire localization/ Dr Bary Castilla   COLONOSCOPY  2012   Dr Earlean Shawl   DILATION AND CURETTAGE OF UTERUS     HERNIA REPAIR     HYSTEROSCOPY     MELANOMA EXCISION     middle of chest between breasts, Dr Kellie Moor   NECK SURGERY     PERIPHERAL VASCULAR CATHETERIZATION N/A 12/13/2014    Procedure: IVC Filter Insertion;  Surgeon: Algernon Huxley, MD;  Location: [facility];  Service: Cardiovascular;  Laterality: N/A;   PERIPHERAL VASCULAR CATHETERIZATION  12/13/2014   Procedure: Lower Extremity Intervention;  Surgeon: Algernon Huxley, MD;  Location: [facility];  Service: Cardiovascular;;   PERIPHERAL VASCULAR CATHETERIZATION N/A 02/20/2015   Procedure: IVC Filter Removal;  Surgeon: Algernon Huxley, MD;  Location: [facility];  Service: Cardiovascular;  Laterality: N/A;   Family History  Problem Relation Age of Onset   Heart disease Mother    Hypertension Mother    Ovarian cancer Mother 86   Hypertension Sister    Hypertension Brother    Ovarian cancer Paternal Aunt 85   Breast cancer Neg Hx     Allergies: Pravachol [pravastatin] and Penicillins Current Outpatient Medications on File Prior to Visit  Medication Sig Dispense Refill   aspirin 325 MG tablet Take 325 mg by mouth daily.     budesonide (ENTOCORT EC) 3 MG 24 hr capsule TAKE 3 CAPSULES (9 MG TOTAL) BY MOUTH DAILY. 270 capsule 4   calcium gluconate 500 MG tablet Take 1 tablet by mouth daily.     cholecalciferol (VITAMIN D) 25 MCG (1000 UNIT) tablet Take 1,000 Units by mouth daily.     Cyanocobalamin (VITAMIN B-12) 5000  MCG TBDP Take 5,000 mcg by mouth every other day.      dorzolamidel-timolol (COSOPT) 22.3-6.8 MG/ML SOLN ophthalmic solution Place 1 drop into both eyes 2 (two) times daily. Take one drop in both eyes in the morning & evening before bed.     ezetimibe (ZETIA) 10 MG tablet Take 1 tablet (10 mg total) by mouth daily. 90 tablet 3   fluticasone (FLONASE) 50 MCG/ACT nasal spray Place 2 sprays into both nostrils daily. 16 g 6   hydrALAZINE (APRESOLINE) 10 MG tablet Take 1 tablet (10 mg total) by mouth 3 (three) times daily. 180 tablet 2   letrozole (FEMARA) 2.5 MG tablet TAKE 1 TABLET BY MOUTH DAILY 90 tablet 4   losartan (COZAAR) 100 MG tablet TAKE 1 TABLET BY MOUTH  EVERY EVENING. 90 tablet 1   LUMIGAN 0.01 % SOLN Place 1 drop into both eyes at bedtime.      metoprolol succinate (TOPROL-XL) 100 MG 24 hr tablet TAKE 1 TABLET (100 MG TOTAL) BY MOUTH DAILY. TAKE WITH OR IMMEDIATELY FOLLOWING A MEAL. APPT NEEDED 90 tablet 3   mometasone (ELOCON) 0.1 % cream Apply 1 application topically daily. Appear pea sized ( or less) amount to external ear, and slightly in canal. 15 g 2   Multiple Vitamins-Minerals (PRESERVISION AREDS 2 PO) Take 1 tablet by mouth 2 (two) times daily.      simethicone (MYLICON) 601 MG chewable tablet Chew 125 mg by mouth every morning.     torsemide (DEMADEX) 10 MG tablet Take 1 tablet by mouth daily as needed.     No current facility-administered medications on file prior to visit.    Social History   Tobacco Use   Smoking status: Never   Smokeless tobacco: Never  Substance Use Topics   Alcohol use: No   Drug use: No    Review of Systems  Constitutional:  Negative for chills and fever.  Respiratory:  Negative for cough and shortness of breath.   Cardiovascular:  Negative for chest pain, palpitations and leg swelling.  Gastrointestinal:  Negative for nausea and vomiting.     Objective:    BP 120/72    Pulse 65    Ht 5' 7.01" (1.702 m)    Wt 153 lb 3.2 oz (69.5 kg)    SpO2 98%    BMI 23.99 kg/m  BP Readings from Last 3 Encounters:  01/24/21 120/72  01/01/21 140/80  11/18/20 132/68   Wt Readings from Last 3 Encounters:  01/24/21 153 lb 3.2 oz (69.5 kg)  01/01/21 149 lb 6.4 oz (67.8 kg)  11/18/20 146 lb (66.2 kg)    Physical Exam Vitals reviewed.  Constitutional:      Appearance: She is well-developed.  Eyes:     Conjunctiva/sclera: Conjunctivae normal.  Cardiovascular:     Rate and Rhythm: Normal rate and regular rhythm.     Pulses: Normal pulses.     Heart sounds: Normal heart sounds.     Comments: Wearing compression stockings Pulmonary:     Effort: Pulmonary effort is normal.     Breath sounds:  Normal breath sounds. No wheezing, rhonchi or rales.  Musculoskeletal:     Right lower leg: No edema.     Left lower leg: No edema.  Skin:    General: Skin is warm and dry.  Neurological:     Mental Status: She is alert.  Psychiatric:        Speech: Speech normal.  Behavior: Behavior normal.        Thought Content: Thought content normal.       Assessment & Plan:   Problem List Items Addressed This Visit       Cardiovascular and Mediastinum   Essential hypertension - Primary    Well controlled. Pleased readings at home. Continue amlodipine 5 mg every morning, 2.5 mg every afternoon,hydralazine 10 mg 3 times daily, losartan 100 mg qpm, Toprol 100 mg      Relevant Medications   amLODipine (NORVASC) 5 MG tablet   amLODipine (NORVASC) 2.5 MG tablet   Other Relevant Orders   Basic metabolic panel     Other   Edema of lower extremity    Chronic, stable.  Continue  torsemide 10 mg prn, mostly every other to every third day.         I have changed Timiya B. Marks's amLODipine. I am also having her start on amLODipine. Additionally, I am having her maintain her Lumigan, aspirin, Multiple Vitamins-Minerals (PRESERVISION AREDS 2 PO), simethicone, Vitamin B-12, letrozole, dorzolamidel-timolol, cholecalciferol, calcium gluconate, metoprolol succinate, fluticasone, mometasone, budesonide, losartan, torsemide, ezetimibe, and hydrALAZINE.   Meds ordered this encounter  Medications   amLODipine (NORVASC) 5 MG tablet    Sig: Take 1 tablet (5 mg total) by mouth daily.    Dispense:  90 tablet    Refill:  3    Order Specific Question:   Supervising Provider    Answer:   Deborra Medina L [[id number]]   amLODipine (NORVASC) 2.5 MG tablet    Sig: Take 1 tablet (2.5 mg total) by mouth daily.    Dispense:  90 tablet    Refill:  3    Order Specific Question:   Supervising Provider    Answer:   Crecencio Mc [[id number]]    Return precautions given.   Risks, benefits, and alternatives  of the medications and treatment plan prescribed today were discussed, and patient expressed understanding.   Education regarding symptom management and diagnosis given to patient on AVS.  Continue to follow with Burnard Hawthorne, FNP for routine health maintenance.   Mia Perez and I agreed with plan.   Mable Paris, FNP

## 2021-01-24 NOTE — Patient Instructions (Signed)
Please schedule kidney blood work in the next 1 to 2 weeks.  Make sure you are very well-hydrated If you would like cholesterol, blood sugar drawn ahead of our follow-up in June 2023, please call the office and I will schedule

## 2021-01-24 NOTE — Assessment & Plan Note (Signed)
Chronic, stable.  Continue  torsemide 10 mg prn, mostly every other to every third day.

## 2021-01-24 NOTE — Assessment & Plan Note (Signed)
Well controlled. Pleased readings at home. Continue amlodipine 5 mg every morning, 2.5 mg every afternoon,hydralazine 10 mg 3 times daily, losartan 100 mg qpm, Toprol 100 mg

## 2021-01-29 DIAGNOSIS — H353132 Nonexudative age-related macular degeneration, bilateral, intermediate dry stage: Secondary | ICD-10-CM | POA: Diagnosis not present

## 2021-02-07 ENCOUNTER — Other Ambulatory Visit: Payer: Self-pay

## 2021-02-07 ENCOUNTER — Other Ambulatory Visit (INDEPENDENT_AMBULATORY_CARE_PROVIDER_SITE_OTHER): Payer: Medicare PPO

## 2021-02-07 DIAGNOSIS — I10 Essential (primary) hypertension: Secondary | ICD-10-CM | POA: Diagnosis not present

## 2021-02-07 LAB — BASIC METABOLIC PANEL
BUN: 39 mg/dL — ABNORMAL HIGH (ref 6–23)
CO2: 25 mEq/L (ref 19–32)
Calcium: 9 mg/dL (ref 8.4–10.5)
Chloride: 105 mEq/L (ref 96–112)
Creatinine, Ser: 1.31 mg/dL — ABNORMAL HIGH (ref 0.40–1.20)
GFR: 37.28 mL/min — ABNORMAL LOW (ref >60.00)
Glucose, Bld: 80 mg/dL (ref 70–99)
Potassium: 4.1 mEq/L (ref 3.5–5.1)
Sodium: 138 mEq/L (ref 135–145)

## 2021-02-18 ENCOUNTER — Ambulatory Visit
Admission: RE | Admit: 2021-02-18 | Discharge: 2021-02-18 | Disposition: A | Discharge status: Home / Self Care | Payer: Medicare PPO | Source: Ambulatory Visit | Attending: General Surgery | Admitting: General Surgery

## 2021-02-18 ENCOUNTER — Other Ambulatory Visit: Payer: Self-pay

## 2021-02-18 DIAGNOSIS — C50919 Malignant neoplasm of unspecified site of unspecified female breast: Secondary | ICD-10-CM | POA: Diagnosis not present

## 2021-02-18 DIAGNOSIS — Z1231 Encounter for screening mammogram for malignant neoplasm of breast: Secondary | ICD-10-CM | POA: Insufficient documentation

## 2021-02-25 ENCOUNTER — Other Ambulatory Visit: Payer: Self-pay | Admitting: General Surgery

## 2021-02-25 DIAGNOSIS — Z853 Personal history of malignant neoplasm of breast: Secondary | ICD-10-CM | POA: Diagnosis not present

## 2021-02-25 NOTE — Progress Notes (Signed)
v

## 2021-02-26 ENCOUNTER — Other Ambulatory Visit: Payer: Self-pay | Admitting: Family

## 2021-02-26 DIAGNOSIS — I10 Essential (primary) hypertension: Secondary | ICD-10-CM

## 2021-03-13 ENCOUNTER — Telehealth: Payer: Self-pay

## 2021-03-13 NOTE — Telephone Encounter (Signed)
Patient insurance company Approved medication till 02/08/22

## 2021-03-13 NOTE — Telephone Encounter (Signed)
Received a noticed that patient needed a PA on Budesonide 3mg . Did PA on cover my meds and waiting on response from insurance company.

## 2021-03-30 ENCOUNTER — Other Ambulatory Visit: Payer: Self-pay | Admitting: Family

## 2021-03-30 DIAGNOSIS — I10 Essential (primary) hypertension: Secondary | ICD-10-CM

## 2021-04-07 ENCOUNTER — Ambulatory Visit (INDEPENDENT_AMBULATORY_CARE_PROVIDER_SITE_OTHER): Payer: Medicare PPO | Admitting: Gastroenterology

## 2021-04-07 ENCOUNTER — Other Ambulatory Visit: Payer: Self-pay

## 2021-04-07 ENCOUNTER — Encounter: Payer: Self-pay | Admitting: Gastroenterology

## 2021-04-07 VITALS — BP 157/78 | HR 61 | Temp 98.1°F | Ht 67.0 in | Wt 153.2 lb

## 2021-04-07 DIAGNOSIS — K52832 Lymphocytic colitis: Secondary | ICD-10-CM

## 2021-04-07 NOTE — Progress Notes (Signed)
Cephas Darby, MD [address]  [place]  [place], [place] [address]  Main: [phone]  Fax: [phone]    Gastroenterology Consultation  Referring Provider:     Burnard Hawthorne, FNP Primary Care Physician:  Burnard Hawthorne, FNP Primary Gastroenterologist:  Dr. Harriette Bouillon at [facility] Reason for Consultation:     Lymphocytic colitis        HPI:   Mia Perez is a 88 y.o. female referred by Dr. Burnard Hawthorne, FNP  for consultation & management of lymphocytic colitis.  Patient reports that she has been diagnosed with lymphocytic colitis for more than 20 years, has been followed by Dr. Earlean Shawl for several years.  She has been on budesonide, last dose was about an year ago as her insurance did not approve.  She is currently taking Lomotil as needed.  She reports having good days and bad days.  She still struggles going out, eating out and has to plan a head which he is frustrating for her.  She has also lost about 10 pounds since October.  She does have abdominal bloating.  She denies any rectal bleeding.  She lives alone but has friends to socialize about once a week.  Her daughter lives about 10 miles from her, talks to her about twice a week.  She is functionally independent.  Her most recent labs were normal except for mild CKD.  She also reports that she was tested for celiac disease and it was negative  Follow-up visit 06/19/2020 Patient is here for follow-up of lymphocytic colitis.  She is currently on budesonide 9 mg daily.  She reports complete resolution of diarrhea in few days after starting the medication.  Currently having 1 formed bowel movement daily.  She totally feels like a normal person and is gaining weight.  She wants to know if she can restart cholesterol medication, ezetimibe as she is concerned about recurrence of diarrhea on this medication.  Patient has not needed Lomotil in last 1 month  Follow-up visit  09/25/2020 Patient is here for follow-up of lymphocytic colitis.  She reports feeling great.  She does not need to worry about looking for bathroom close by when she goes out.  She is currently on budesonide 1 mg daily.  She is also taking ezetimibe 10 mg daily and feels good.  Reports having 1 bowel movement daily.  She does not have any concerns today.  In fact, she gained few pounds  Follow-up visit 04/07/2021 Patient is here for follow-up of lymphocytic colitis.  She is feeling good.  She is taking budesonide 3 mg once daily.  When she misses a dose, she experiences urgency.  Her weight has been stable.  She does not have any concerns today.  NSAIDs: None  Antiplts/Anticoagulants/Anti thrombotics: Aspirin 325 mg for history of DVT  GI Procedures: Colonoscopy by Dr. Earlean Shawl at [facility] in 2012 Reports having had an upper endoscopy several years ago  Past Medical History:  Diagnosis Date   Anemia    H/O   Arthritis    Breast cancer ([place] [facility]) 02/10/1996   left, lumpectomy and radiation   Breast cancer ([place]) 2017   right   DVT (deep venous thrombosis) ([place]) 11/2014   GERD (gastroesophageal reflux disease)    Hypertension    Lymphocytic colitis    Personal history of radiation therapy 1998, 2017   BREAST CA   Ulcerative colitis ([place]) 2014   Varicose  veins     Past Surgical History:  Procedure Laterality Date   BREAST EXCISIONAL BIOPSY Right 2014   NEG   BREAST LUMPECTOMY Left 1998   radiation   BREAST LUMPECTOMY Right 2017   BREAST LUMPECTOMY WITH SENTINEL LYMPH NODE BIOPSY Right 12/30/2015   8 mm, T1b, N0; RUOQ, ER 95%; PR: 80 %, Her 2 neu not overexpressed.Mammosite Radiation.   BREAST SURGERY Right 2014   Microcalcifications excised with wire localization/ Dr Bary Castilla   COLONOSCOPY  2012   Dr Earlean Shawl   DILATION AND CURETTAGE OF UTERUS     HERNIA REPAIR     HYSTEROSCOPY     MELANOMA EXCISION     middle of chest between breasts, Dr Kellie Moor   NECK SURGERY      PERIPHERAL VASCULAR CATHETERIZATION N/A 12/13/2014   Procedure: IVC Filter Insertion;  Surgeon: Algernon Huxley, MD;  Location: [facility];  Service: Cardiovascular;  Laterality: N/A;   PERIPHERAL VASCULAR CATHETERIZATION  12/13/2014   Procedure: Lower Extremity Intervention;  Surgeon: Algernon Huxley, MD;  Location: [facility];  Service: Cardiovascular;;   PERIPHERAL VASCULAR CATHETERIZATION N/A 02/20/2015   Procedure: IVC Filter Removal;  Surgeon: Algernon Huxley, MD;  Location: [facility];  Service: Cardiovascular;  Laterality: N/A;    Current Outpatient Medications:    amLODipine (NORVASC) 2.5 MG tablet, Take 1 tablet (2.5 mg total) by mouth daily., Disp: 90 tablet, Rfl: 3   amLODipine (NORVASC) 5 MG tablet, Take 1 tablet (5 mg total) by mouth daily., Disp: 90 tablet, Rfl: 3   aspirin 325 MG tablet, Take 325 mg by mouth daily., Disp: , Rfl:    budesonide (ENTOCORT EC) 3 MG 24 hr capsule, TAKE 3 CAPSULES (9 MG TOTAL) BY MOUTH DAILY., Disp: 270 capsule, Rfl: 4   cholecalciferol (VITAMIN D) 25 MCG (1000 UNIT) tablet, Take 1,000 Units by mouth daily., Disp: , Rfl:    Cyanocobalamin (VITAMIN B-12) 5000 MCG TBDP, Take 5,000 mcg by mouth every other day. , Disp: , Rfl:    dorzolamidel-timolol (COSOPT) 22.3-6.8 MG/ML SOLN ophthalmic solution, Place 1 drop into both eyes 2 (two) times daily. Take one drop in both eyes in the morning & evening before bed., Disp: , Rfl:    ezetimibe (ZETIA) 10 MG tablet, Take 1 tablet (10 mg total) by mouth daily., Disp: 90 tablet, Rfl: 3   fluticasone (FLONASE) 50 MCG/ACT nasal spray, Place 2 sprays into both nostrils daily., Disp: 16 g, Rfl: 6   hydrALAZINE (APRESOLINE) 10 MG tablet, Take 1 tablet (10 mg total) by mouth 3 (three) times daily., Disp: 180 tablet, Rfl: 2   letrozole (FEMARA) 2.5 MG tablet, TAKE 1 TABLET BY MOUTH DAILY, Disp: 90 tablet, Rfl: 4   losartan (COZAAR) 100 MG tablet, TAKE 1 TABLET BY MOUTH EVERY DAY IN THE EVENING, Disp: 90  tablet, Rfl: 1   LUMIGAN 0.01 % SOLN, Place 1 drop into both eyes at bedtime. , Disp: , Rfl:    metoprolol succinate (TOPROL-XL) 100 MG 24 hr tablet, TAKE 1 TABLET (100 MG TOTAL) BY MOUTH DAILY. TAKE WITH OR IMMEDIATELY FOLLOWING A MEAL. APPT NEEDED, Disp: 90 tablet, Rfl: 3   mometasone (ELOCON) 0.1 % cream, Apply 1 application topically daily. Appear pea sized ( or less) amount to external ear, and slightly in canal., Disp: 15 g, Rfl: 2   Multiple Vitamins-Minerals (PRESERVISION AREDS 2 PO), Take 1 tablet by mouth 2 (two) times daily. , Disp: , Rfl:  simethicone (MYLICON) 628 MG chewable tablet, Chew 125 mg by mouth every morning., Disp: , Rfl:    torsemide (DEMADEX) 10 MG tablet, Take 1 tablet by mouth daily as needed., Disp: , Rfl:    Family History  Problem Relation Age of Onset   Heart disease Mother    Hypertension Mother    Ovarian cancer Mother 4   Hypertension Sister    Hypertension Brother    Ovarian cancer Paternal Aunt 85   Breast cancer Neg Hx      Social History   Tobacco Use   Smoking status: Never   Smokeless tobacco: Never  Substance Use Topics   Alcohol use: No   Drug use: No    Allergies as of 04/07/2021 - Review Complete 04/07/2021  Allergen Reaction Noted   Pravachol [pravastatin]  02/22/2019   Penicillins Rash 02/04/2011    Review of Systems:    All systems reviewed and negative except where noted in HPI.   Physical Exam:  BP (!) 157/78 (BP Location: Left Arm, Patient Position: Sitting, Cuff Size: Normal)    Pulse 61    Temp 98.1 F (36.7 C) (Oral)    Ht 5\' 7"  (1.702 m)    Wt 153 lb 4 oz (69.5 kg)    BMI 24.00 kg/m  No LMP recorded. Patient is postmenopausal.  General:   Alert, moderately built, moderately nourished, pleasant and cooperative in NAD Head:  Normocephalic and atraumatic. Eyes:  Sclera clear, no icterus.   Conjunctiva pink. Ears:  Normal auditory acuity. Nose:  No deformity, discharge, or lesions. Mouth:  No deformity or  lesions,oropharynx pink & moist. Neck:  Supple; no masses or thyromegaly. Lungs:  Respirations even and unlabored.  Clear throughout to auscultation.   No wheezes, crackles, or rhonchi. No acute distress. Heart:  Regular rate and rhythm; no murmurs, clicks, rubs, or gallops. Abdomen:  Normal bowel sounds. Soft, non-tender and non distended, without masses, hepatosplenomegaly or hernias noted.  No guarding or rebound tenderness.   Rectal: Not performed Msk:  Symmetrical without gross deformities. Good, equal movement & strength bilaterally. Pulses:  Normal pulses noted. Extremities:  No clubbing or edema.  No cyanosis. Neurologic:  Alert and oriented x3;  grossly normal neurologically. Skin:  Intact without significant lesions or rashes. No jaundice. Psych:  Alert and cooperative. Normal mood and affect.  Imaging Studies: No abdominal imaging  Assessment and Plan:   Mia Perez is a 88 y.o. pleasant Caucasian female with history of lymphocytic colitis diagnosed in 2012, has been maintained on budesonide intermittently with good response, currently on Lomotil as needed.  History of weight loss, which has resolved.  Patient is gaining weight  Lymphocytic colitis: Diarrhea has currently resolved, Currently maintained on budesonide 3 mg daily, continue the current dose.  Also, advised patient that she could switch to every other day Continue Lomotil as needed pancreatic fecal elastase levels normal   Follow up as needed  Cephas Darby, MD

## 2021-04-14 DIAGNOSIS — N281 Cyst of kidney, acquired: Secondary | ICD-10-CM | POA: Diagnosis not present

## 2021-04-14 DIAGNOSIS — I10 Essential (primary) hypertension: Secondary | ICD-10-CM | POA: Diagnosis not present

## 2021-04-14 DIAGNOSIS — R600 Localized edema: Secondary | ICD-10-CM | POA: Diagnosis not present

## 2021-04-14 DIAGNOSIS — N1832 Chronic kidney disease, stage 3b: Secondary | ICD-10-CM | POA: Diagnosis not present

## 2021-04-14 DIAGNOSIS — R809 Proteinuria, unspecified: Secondary | ICD-10-CM | POA: Diagnosis not present

## 2021-04-23 DIAGNOSIS — N281 Cyst of kidney, acquired: Secondary | ICD-10-CM | POA: Diagnosis not present

## 2021-04-23 DIAGNOSIS — R809 Proteinuria, unspecified: Secondary | ICD-10-CM | POA: Diagnosis not present

## 2021-04-23 DIAGNOSIS — N1832 Chronic kidney disease, stage 3b: Secondary | ICD-10-CM | POA: Diagnosis not present

## 2021-04-23 DIAGNOSIS — I10 Essential (primary) hypertension: Secondary | ICD-10-CM | POA: Diagnosis not present

## 2021-04-23 DIAGNOSIS — R600 Localized edema: Secondary | ICD-10-CM | POA: Diagnosis not present

## 2021-05-10 ENCOUNTER — Other Ambulatory Visit: Payer: Self-pay | Admitting: Family

## 2021-05-10 DIAGNOSIS — I10 Essential (primary) hypertension: Secondary | ICD-10-CM

## 2021-06-02 ENCOUNTER — Ambulatory Visit (INDEPENDENT_AMBULATORY_CARE_PROVIDER_SITE_OTHER): Payer: Medicare PPO

## 2021-06-02 VITALS — Ht 67.0 in | Wt 153.0 lb

## 2021-06-02 DIAGNOSIS — Z0000 Encounter for general adult medical examination without abnormal findings: Secondary | ICD-10-CM | POA: Diagnosis not present

## 2021-06-02 NOTE — Patient Instructions (Addendum)
?  Mia Perez , ?Thank you for taking time to come for your Medicare Wellness Visit. I appreciate your ongoing commitment to your health goals. Please review the following plan we discussed and let me know if I can assist you in the future.  ? ?These are the goals we discussed: ? Goals   ? ?  ? Patient Stated  ?   Maintain Healthy Lifestyle (pt-stated)   ?   Stay hydrated. ?Stay active. ?Healthy diet. ?  ? ?  ?  ?This is a list of the screening recommended for you and due dates:  ?Health Maintenance  ?Topic Date Due  ? COVID-19 Vaccine (3 - Pfizer risk series) 06/18/2021*  ? Zoster (Shingles) Vaccine (1 of 2) 09/01/2021*  ? Tetanus Vaccine  01/01/2022*  ? Flu Shot  09/09/2021  ? Pneumonia Vaccine  Completed  ? DEXA scan (bone density measurement)  Completed  ? HPV Vaccine  Aged Out  ?*Topic was postponed. The date shown is not the original due date.  ?  ?

## 2021-06-02 NOTE — Progress Notes (Signed)
Subjective:   Mia Perez is a 88 y.o. female who presents for Medicare Annual (Subsequent) preventive examination.  Review of Systems    No ROS.  Medicare Wellness Virtual Visit.  Visual/audio telehealth visit, UTA vital signs.   See social history for additional risk factors.         Objective:    Today's Vitals   06/02/21 1419  Weight: 153 lb (69.4 kg)  Height: 5\' 7"  (1.702 m)   Body mass index is 23.96 kg/m.     04/11/2020    1:47 PM 11/30/2019   11:48 AM 01/23/2019    9:52 AM 11/15/2018   12:10 PM 01/17/2018    9:59 AM 01/25/2017    9:45 AM 07/15/2016    9:50 AM  Advanced Directives  Does Patient Have a Medical Advance Directive? Yes Yes No No No No No  Type of Special educational needs teacher of [place];Living will       Does patient want to make changes to medical advance directive? No - Patient declined No - Patient declined       Copy of Healthcare Power of Attorney in Chart?  No - copy requested       Would patient like information on creating a medical advance directive?   No - Patient declined No - Patient declined No - Patient declined No - Patient declined No - Patient declined    Current Medications (verified) Outpatient Encounter Medications as of 06/02/2021  Medication Sig   amLODipine (NORVASC) 2.5 MG tablet Take 1 tablet (2.5 mg total) by mouth daily.   amLODipine (NORVASC) 5 MG tablet Take 1 tablet (5 mg total) by mouth daily.   aspirin 325 MG tablet Take 325 mg by mouth daily.   budesonide (ENTOCORT EC) 3 MG 24 hr capsule TAKE 3 CAPSULES (9 MG TOTAL) BY MOUTH DAILY.   cholecalciferol (VITAMIN D) 25 MCG (1000 UNIT) tablet Take 1,000 Units by mouth daily.   Cyanocobalamin (VITAMIN B-12) 5000 MCG TBDP Take 5,000 mcg by mouth every other day.    dorzolamidel-timolol (COSOPT) 22.3-6.8 MG/ML SOLN ophthalmic solution Place 1 drop into both eyes 2 (two) times daily. Take one drop in both eyes in the morning & evening before bed.   ezetimibe (ZETIA)  10 MG tablet Take 1 tablet (10 mg total) by mouth daily.   fluticasone (FLONASE) 50 MCG/ACT nasal spray Place 2 sprays into both nostrils daily.   hydrALAZINE (APRESOLINE) 10 MG tablet TAKE 1 TABLET BY MOUTH THREE TIMES A DAY   letrozole (FEMARA) 2.5 MG tablet TAKE 1 TABLET BY MOUTH DAILY   losartan (COZAAR) 100 MG tablet TAKE 1 TABLET BY MOUTH EVERY DAY IN THE EVENING   LUMIGAN 0.01 % SOLN Place 1 drop into both eyes at bedtime.    metoprolol succinate (TOPROL-XL) 100 MG 24 hr tablet TAKE 1 TABLET (100 MG TOTAL) BY MOUTH DAILY. TAKE WITH OR IMMEDIATELY FOLLOWING A MEAL. APPT NEEDED   mometasone (ELOCON) 0.1 % cream Apply 1 application topically daily. Appear pea sized ( or less) amount to external ear, and slightly in canal.   Multiple Vitamins-Minerals (PRESERVISION AREDS 2 PO) Take 1 tablet by mouth 2 (two) times daily.    simethicone (MYLICON) 125 MG chewable tablet Chew 125 mg by mouth every morning.   torsemide (DEMADEX) 10 MG tablet Take 1 tablet by mouth daily as needed.   No facility-administered encounter medications on file as of 06/02/2021.    Allergies (verified) Pravachol [pravastatin] and Penicillins  History: Past Medical History:  Diagnosis Date   Anemia    H/O   Arthritis    Breast cancer ([facility]) 02/10/1996   left, lumpectomy and radiation   Breast cancer ([facility]) 2017   right   DVT (deep venous thrombosis) ([facility]) 11/2014   GERD (gastroesophageal reflux disease)    Hypertension    Lymphocytic colitis    Personal history of radiation therapy 1998, 2017   BREAST CA   Ulcerative colitis ([facility]) 2014   Varicose veins    Past Surgical History:  Procedure Laterality Date   BREAST EXCISIONAL BIOPSY Right 2014   NEG   BREAST LUMPECTOMY Left 1998   radiation   BREAST LUMPECTOMY Right 2017   BREAST LUMPECTOMY WITH SENTINEL LYMPH NODE BIOPSY Right 12/30/2015   8 mm, T1b, N0; RUOQ, ER 95%; PR: 80 %, Her 2 neu not overexpressed.Mammosite Radiation.   BREAST SURGERY Right 2014    Microcalcifications excised with wire localization/ Dr Lemar Livings   COLONOSCOPY  2012   Dr Kinnie Scales   DILATION AND CURETTAGE OF UTERUS     HERNIA REPAIR     HYSTEROSCOPY     MELANOMA EXCISION     middle of chest between breasts, Dr Roseanne Kaufman   NECK SURGERY     PERIPHERAL VASCULAR CATHETERIZATION N/A 12/13/2014   Procedure: IVC Filter Insertion;  Surgeon: Annice Needy, MD;  Location: [facility];  Service: Cardiovascular;  Laterality: N/A;   PERIPHERAL VASCULAR CATHETERIZATION  12/13/2014   Procedure: Lower Extremity Intervention;  Surgeon: Annice Needy, MD;  Location: [facility];  Service: Cardiovascular;;   PERIPHERAL VASCULAR CATHETERIZATION N/A 02/20/2015   Procedure: IVC Filter Removal;  Surgeon: Annice Needy, MD;  Location: [facility];  Service: Cardiovascular;  Laterality: N/A;   Family History  Problem Relation Age of Onset   Heart disease Mother    Hypertension Mother    Ovarian cancer Mother 75   Hypertension Sister    Hypertension Brother    Ovarian cancer Paternal Aunt 26   Breast cancer Neg Hx    Social History   Socioeconomic History   Marital status: Widowed    Spouse name: Not on file   Number of children: 1   Years of education: Not on file   Highest education level: Not on file  Occupational History   Occupation: Retired   Tobacco Use   Smoking status: Never   Smokeless tobacco: Never  Substance and Sexual Activity   Alcohol use: No   Drug use: No   Sexual activity: Never  Other Topics Concern   Not on file  Social History Narrative   Lives in [place]. Husband passed away 15. Irving Burton, daughter.      Work - [employer] 13 years, then [employer]      Exercise - Curves      Diet - regular.      Social Determinants of Health   Financial Resource Strain: Low Risk    Difficulty of Paying Living Expenses: Not hard at all  Food Insecurity: No Food Insecurity   Worried About Programme researcher, broadcasting/film/video in the Last Year:  Never true   Ran Out of Food in the Last Year: Never true  Transportation Needs: No Transportation Needs   Lack of Transportation (Medical): No   Lack of Transportation (Non-Medical): No  Physical Activity: Not on file  Stress: No Stress Concern Present   Feeling of Stress : Not at all  Social Connections:  Unknown   Frequency of Communication with Friends and Family: More than three times a week   Frequency of Social Gatherings with Friends and Family: Not on file   Attends Religious Services: More than 4 times per year   Active Member of [employer] or Organizations: Yes   Attends Engineer, structural: More than 4 times per year   Marital Status: Not on file    Tobacco Counseling Counseling given: Not Answered   Clinical Intake:  Pre-visit preparation completed: Yes        Diabetes: No  How often do you need to have someone help you when you read instructions, pamphlets, or other written materials from your doctor or pharmacy?: 1 - Never    Interpreter Needed?: No      Activities of Daily Living     View : No data to display.          Patient Care Team: Allegra Grana, FNP as PCP - General (Family Medicine) Lemar Livings, Merrily Pew, MD (General Surgery) Lemar Livings Merrily Pew, MD (General Surgery) Tommie Sams, DO as Consulting Physician (Family Medicine)  Indicate any recent Medical Services you may have received from other than [facility] providers in the past year (date may be approximate).     Assessment:   This is a routine wellness examination for Mia Perez.  Virtual Visit via Telephone Note  I connected with  Mia Perez on 06/02/21 at  2:15 PM EDT by telephone and verified that I am speaking with the correct person using two identifiers.  Persons participating in the virtual visit: patient/Nurse Health Advisor   I discussed the limitations of performing an evaluation and management service by telehealth. The patient expressed understanding and  agreed to proceed. We continued and completed visit with audio only. Some vital signs may be absent or patient reported.   Hearing/Vision screen No results found.  Dietary issues and exercise activities discussed:   Healthy diet Good water intake   Goals Addressed               This Visit's Progress     Patient Stated     Maintain Healthy Lifestyle (pt-stated)        Stay hydrated. Stay active. Healthy diet.       Depression Screen    06/02/2021    2:30 PM 01/24/2021    9:28 AM 01/01/2021   10:26 AM 04/11/2020    1:48 PM 11/30/2019   11:36 AM 08/30/2019   11:34 AM 11/15/2018   11:46 AM  PHQ 2/9 Scores  PHQ - 2 Score 0 0 0 0 0 0 0    Fall Risk    01/24/2021    9:28 AM 01/01/2021   10:26 AM 05/23/2020    1:36 PM 04/11/2020    1:48 PM 11/30/2019   11:38 AM  Fall Risk   Falls in the past year? 0 0 0 0 0  Number falls in past yr: 0 0   0  Injury with Fall? 0 0     Follow up Falls evaluation completed    Falls evaluation completed    FALL RISK PREVENTION PERTAINING TO THE HOME: Home free of loose throw rugs in walkways, pet beds, electrical cords, etc? Yes  Adequate lighting in your home to reduce risk of falls? Yes   ASSISTIVE DEVICES UTILIZED TO PREVENT FALLS: Tracking Life alert? Yes  Use of a cane, walker or w/c? No   TIMED UP AND GO: Was the test  performed? No .   Cognitive Function: Patient is alert and oriented x3.  Live alone.  Manages her own medications and finances.       01/30/2015    1:35 PM  MMSE - Mini Mental State Exam  Orientation to time 5  Orientation to Place 5  Registration 3  Attention/ Calculation 5  Recall 3  Language- name 2 objects 2  Language- repeat 1  Language- follow 3 step command 3  Language- read & follow direction 1  Write a sentence 1  Copy design 1  Total score 30        11/30/2019   11:56 AM 11/15/2018   12:11 PM 01/30/2016    1:51 PM  6CIT Screen  What Year? 0 points 0 points 0 points  What  month? 0 points 0 points 0 points  What time? 0 points 0 points 0 points  Count back from 20  0 points 0 points  Months in reverse 0 points 0 points 0 points  Repeat phrase  0 points   Total Score  0 points     Immunizations Immunization History  Administered Date(s) Administered   Fluad Quad(high Dose 65+) 10/10/2018, 11/18/2020   Influenza Split 02/23/2012, 10/19/2015   Influenza, High Dose Seasonal PF 10/06/2017, 10/10/2018, 10/17/2019   Influenza,inj,Quad PF,6+ Mos 03/06/2013, 12/12/2013, 10/16/2014   Influenza-Unspecified 02/23/2012, 03/06/2013, 12/12/2013, 10/16/2014, 10/19/2015, 10/13/2016   PFIZER(Purple Top)SARS-COV-2 Vaccination 02/24/2019, 03/17/2019   Pneumococcal Conjugate-13 03/06/2013   Pneumococcal Polysaccharide-23 08/05/2010   Tdap 08/04/2009   Shingrix Completed?: No.    Education has been provided regarding the importance of this vaccine. Patient has been advised to call insurance company to determine out of pocket expense if they have not yet received this vaccine. Advised may also receive vaccine at local pharmacy or Health Dept. Verbalized acceptance and understanding.  Screening Tests Health Maintenance  Topic Date Due   COVID-19 Vaccine (3 - Pfizer risk series) 06/18/2021 (Originally 04/14/2019)   Zoster Vaccines- Shingrix (1 of 2) 09/01/2021 (Originally 02/06/1953)   TETANUS/TDAP  01/01/2022 (Originally 08/05/2019)   INFLUENZA VACCINE  09/09/2021   Pneumonia Vaccine 34+ Years old  Completed   DEXA SCAN  Completed   HPV VACCINES  Aged Out   Health Maintenance There are no preventive care reminders to display for this patient.  Lung Cancer Screening: (Low Dose CT Chest recommended if Age 22-80 years, 30 pack-year currently smoking OR have quit w/in 15years.) does not qualify.   Hepatitis C Screening: does not qualify.  Vision Screening: Recommended annual ophthalmology exams for early detection of glaucoma and other disorders of the eye.  Dental  Screening: Recommended annual dental exams for proper oral hygiene. Dentures.   Community Resource Referral / Chronic Care Management: CRR required this visit?  No   CCM required this visit?  No      Plan:   Keep all routine maintenance appointments.   I have personally reviewed and noted the following in the patient's chart:   Medical and social history Use of alcohol, tobacco or illicit drugs  Current medications and supplements including opioid prescriptions.  Functional ability and status Nutritional status Physical activity Advanced directives List of other physicians Hospitalizations, surgeries, and ER visits in previous 12 months Vitals Screenings to include cognitive, depression, and falls Referrals and appointments  In addition, I have reviewed and discussed with patient certain preventive protocols, quality metrics, and best practice recommendations. A written personalized care plan for preventive services as well as general preventive health recommendations  were provided to patient.     Ashok Pall, LPN   [id number]

## 2021-06-04 ENCOUNTER — Ambulatory Visit: Payer: Medicare PPO | Admitting: Family

## 2021-06-04 VITALS — BP 142/70

## 2021-06-04 DIAGNOSIS — I10 Essential (primary) hypertension: Secondary | ICD-10-CM

## 2021-06-04 DIAGNOSIS — R7303 Prediabetes: Secondary | ICD-10-CM

## 2021-06-04 DIAGNOSIS — E785 Hyperlipidemia, unspecified: Secondary | ICD-10-CM | POA: Diagnosis not present

## 2021-06-04 MED ORDER — HYDROCHLOROTHIAZIDE 12.5 MG PO CAPS: 12.5000 mg | ORAL_CAPSULE | Freq: Every day | ORAL | 1 refills | Status: DC | PRN

## 2021-06-04 NOTE — Patient Instructions (Addendum)
Blood pressure regimen ? ?amlodipine 5 mg every morning, 2.5 mg every night ? ?hydralazine 10 mg 3 times daily in the morning,lunch and dinner  ? ? losartan 100 mg every night ? ?Lets change the  Toprol 100 mg  to lunch time to spread out blood pressure medications.  ? ?I have discontinued torsemide.  ? ?If blood pressure were to elevate greater than 145/80, then take one tablet hctz 12.'5mg'$  .  ? ?DASH Eating Plan ?DASH stands for Dietary Approaches to Stop Hypertension. The DASH eating plan is a healthy eating plan that has been shown to: ?Reduce high blood pressure (hypertension). ?Reduce your risk for type 2 diabetes, heart disease, and stroke. ?Help with weight loss. ?What are tips for following this plan? ?Reading food labels ?Check food labels for the amount of salt (sodium) per serving. Choose foods with less than 5 percent of the Daily Value of sodium. Generally, foods with less than 300 milligrams (mg) of sodium per serving fit into this eating plan. ?To find whole grains, look for the word "whole" as the first word in the ingredient list. ?Shopping ?Buy products labeled as "low-sodium" or "no salt added." ?Buy fresh foods. Avoid canned foods and pre-made or frozen meals. ?Cooking ?Avoid adding salt when cooking. Use salt-free seasonings or herbs instead of table salt or sea salt. Check with your health care provider or pharmacist before using salt substitutes. ?Do not fry foods. Cook foods using healthy methods such as baking, boiling, grilling, roasting, and broiling instead. ?Cook with heart-healthy oils, such as olive, canola, avocado, soybean, or sunflower oil. ?Meal planning ? ?Eat a balanced diet that includes: ?4 or more servings of fruits and 4 or more servings of vegetables each day. Try to fill one-half of your plate with fruits and vegetables. ?6-8 servings of whole grains each day. ?Less than 6 oz (170 g) of lean meat, poultry, or fish each day. A 3-oz (85-g) serving of meat is about the same  size as a deck of cards. One egg equals 1 oz (28 g). ?2-3 servings of low-fat dairy each day. One serving is 1 cup (237 mL). ?1 serving of nuts, seeds, or beans 5 times each week. ?2-3 servings of heart-healthy fats. Healthy fats called omega-3 fatty acids are found in foods such as walnuts, flaxseeds, fortified milks, and eggs. These fats are also found in cold-water fish, such as sardines, salmon, and mackerel. ?Limit how much you eat of: ?Canned or prepackaged foods. ?Food that is high in trans fat, such as some fried foods. ?Food that is high in saturated fat, such as fatty meat. ?Desserts and other sweets, sugary drinks, and other foods with added sugar. ?Full-fat dairy products. ?Do not salt foods before eating. ?Do not eat more than 4 egg yolks a week. ?Try to eat at least 2 vegetarian meals a week. ?Eat more home-cooked food and less restaurant, buffet, and fast food. ?Lifestyle ?When eating at a restaurant, ask that your food be prepared with less salt or no salt, if possible. ?If you drink alcohol: ?Limit how much you use to: ?0-1 drink a day for women who are not pregnant. ?0-2 drinks a day for men. ?Be aware of how much alcohol is in your drink. In the [place] one drink equals one 12 oz bottle of beer (355 mL), one 5 oz glass of wine (148 mL), or one 1? oz glass of hard liquor (44 mL). ?General information ?Avoid eating more than 2,300 mg of salt a day. If  you have hypertension, you may need to reduce your sodium intake to 1,500 mg a day. ?Work with your health care provider to maintain a healthy body weight or to lose weight. Ask what an ideal weight is for you. ?Get at least 30 minutes of exercise that causes your heart to beat faster (aerobic exercise) most days of the week. Activities may include walking, swimming, or biking. ?Work with your health care provider or dietitian to adjust your eating plan to your individual calorie needs. ?What foods should I eat? ?Fruits ?All fresh, dried, or frozen  fruit. Canned fruit in natural juice (without added sugar). ?Vegetables ?Fresh or frozen vegetables (raw, steamed, roasted, or grilled). Low-sodium or reduced-sodium tomato and vegetable juice. Low-sodium or reduced-sodium tomato sauce and tomato paste. Low-sodium or reduced-sodium canned vegetables. ?Grains ?Whole-grain or whole-wheat bread. Whole-grain or whole-wheat pasta. Brown rice. Modena Morrow. Bulgur. Whole-grain and low-sodium cereals. Pita bread. Low-fat, low-sodium crackers. Whole-wheat flour tortillas. ?Meats and other proteins ?Skinless chicken or [place]. Ground chicken or [place]. Pork with fat trimmed off. Fish and seafood. Egg whites. Dried beans, peas, or lentils. Unsalted nuts, nut butters, and seeds. Unsalted canned beans. Lean cuts of beef with fat trimmed off. Low-sodium, lean precooked or cured meat, such as sausages or meat loaves. ?Dairy ?Low-fat (1%) or fat-free (skim) milk. Reduced-fat, low-fat, or fat-free cheeses. Nonfat, low-sodium ricotta or cottage cheese. Low-fat or nonfat yogurt. Low-fat, low-sodium cheese. ?Fats and oils ?Soft margarine without trans fats. Vegetable oil. Reduced-fat, low-fat, or light mayonnaise and salad dressings (reduced-sodium). Canola, safflower, olive, avocado, soybean, and sunflower oils. Avocado. ?Seasonings and condiments ?Herbs. Spices. Seasoning mixes without salt. ?Other foods ?Unsalted popcorn and pretzels. Fat-free sweets. ?The items listed above may not be a complete list of foods and beverages you can eat. Contact a dietitian for more information. ?What foods should I avoid? ?Fruits ?Canned fruit in a light or heavy syrup. Fried fruit. Fruit in cream or butter sauce. ?Vegetables ?Creamed or fried vegetables. Vegetables in a cheese sauce. Regular canned vegetables (not low-sodium or reduced-sodium). Regular canned tomato sauce and paste (not low-sodium or reduced-sodium). Regular tomato and vegetable juice (not low-sodium or reduced-sodium).  Angie Fava. Olives. ?Grains ?Baked goods made with fat, such as croissants, muffins, or some breads. Dry pasta or rice meal packs. ?Meats and other proteins ?Fatty cuts of meat. Ribs. Fried meat. Berniece Salines. Bologna, salami, and other precooked or cured meats, such as sausages or meat loaves. Fat from the back of a pig (fatback). Bratwurst. Salted nuts and seeds. Canned beans with added salt. Canned or smoked fish. Whole eggs or egg yolks. Chicken or [place] with skin. ?Dairy ?Whole or 2% milk, cream, and half-and-half. Whole or full-fat cream cheese. Whole-fat or sweetened yogurt. Full-fat cheese. Nondairy creamers. Whipped toppings. Processed cheese and cheese spreads. ?Fats and oils ?Butter. Stick margarine. Lard. Shortening. Ghee. Bacon fat. Tropical oils, such as coconut, palm kernel, or palm oil. ?Seasonings and condiments ?Onion salt, garlic salt, seasoned salt, table salt, and sea salt. Worcestershire sauce. Tartar sauce. Barbecue sauce. Teriyaki sauce. Soy sauce, including reduced-sodium. Steak sauce. Canned and packaged gravies. Fish sauce. Oyster sauce. Cocktail sauce. Store-bought horseradish. Ketchup. Mustard. Meat flavorings and tenderizers. Bouillon cubes. Hot sauces. Pre-made or packaged marinades. Pre-made or packaged taco seasonings. Relishes. Regular salad dressings. ?Other foods ?Salted popcorn and pretzels. ?The items listed above may not be a complete list of foods and beverages you should avoid. Contact a dietitian for more information. ?Where to find more information ?National Heart, Lung, and  Blood Institute: https://wilson-eaton.com/ ?American Heart Association: www.heart.org ?Academy of Nutrition and Dietetics: www.eatright.org ?[place]: www.kidney.org ?Summary ?The DASH eating plan is a healthy eating plan that has been shown to reduce high blood pressure (hypertension). It may also reduce your risk for type 2 diabetes, heart disease, and stroke. ?When on the DASH eating plan, aim to  eat more fresh fruits and vegetables, whole grains, lean proteins, low-fat dairy, and heart-healthy fats. ?With the DASH eating plan, you should limit salt (sodium) intake to 2,300 mg a day. If you have hypertension,

## 2021-06-04 NOTE — Progress Notes (Signed)
? ?Subjective:  ? ? [name]: Mia Perez, female    DOB: July 26, 1933, 88 y.o.   MRN: [id number] ? ?CC: Mia Perez is a 88 y.o. female who presents today for follow up.  ? ?HPI: Here today for concern for elevated blood pressure ?Last night 169/78 at 8pm, then rechecked 140/65 ?2 days prior at 2AM she woke up and couldn't sleep 164/76 which responded after medication.  ?3 days prior 118/80.  ? ?She is compliant with amlodipine 5 mg every morning, 2.5 mg every afternoon,hydralazine 10 mg 3 times daily in the morning,lunch and dinner , losartan 100 mg qpm, Toprol 100 mg qam ? ?She felt funny which initiated her to check blood pressure. She usually checks BP every other day.  ? ?She takes demadex '10mg'$  about every other day. Bilateral leg swelling is minimal. She hasnt noticed demadex lowers blood pressure ? ?Denies associated exertional chest pain or pressure, numbness or tingling radiating to left arm or jaw, palpitations, dizziness, frequent headaches, changes in vision, or shortness of breath.  ? ? ?No increase salt ?No dietary changes ?No Nsaids use ?No alcohol or decongestants ? ? ? ?CKD- following with nephrology Dr [name], last seen 04/23/21. GFR 35 ?HISTORY:  ?Past Medical History:  ?Diagnosis Date  ? Anemia   ? H/O  ? Arthritis   ? Breast cancer ([place]) 02/10/1996  ? left, lumpectomy and radiation  ? Breast cancer ([place]) 2017  ? right  ? DVT (deep venous thrombosis) ([place]) 11/2014  ? GERD (gastroesophageal reflux disease)   ? Hypertension   ? Lymphocytic colitis   ? Personal history of radiation therapy 1998, 2017  ? BREAST CA  ? Ulcerative colitis ([place]) 2014  ? Varicose veins   ? ?Past Surgical History:  ?Procedure Laterality Date  ? BREAST EXCISIONAL BIOPSY Right 2014  ? NEG  ? BREAST LUMPECTOMY Left 1998  ? radiation  ? BREAST LUMPECTOMY Right 2017  ? BREAST LUMPECTOMY WITH SENTINEL LYMPH NODE BIOPSY Right 12/30/2015  ? 8 mm, T1b, N0; RUOQ, ER 95%; PR: 80 %, Her 2 neu not overexpressed.Mammosite  Radiation.  ? BREAST SURGERY Right 2014  ? Microcalcifications excised with wire localization/ Dr Bary Castilla  ? COLONOSCOPY  2012  ? Dr Earlean Shawl  ? DILATION AND CURETTAGE OF UTERUS    ? HERNIA REPAIR    ? HYSTEROSCOPY    ? MELANOMA EXCISION    ? middle of chest between breasts, Dr Kellie Moor  ? NECK SURGERY    ? PERIPHERAL VASCULAR CATHETERIZATION N/A 12/13/2014  ? Procedure: IVC Filter Insertion;  Surgeon: Algernon Huxley, MD;  Location: [facility];  Service: Cardiovascular;  Laterality: N/A;  ? PERIPHERAL VASCULAR CATHETERIZATION  12/13/2014  ? Procedure: Lower Extremity Intervention;  Surgeon: Algernon Huxley, MD;  Location: [facility];  Service: Cardiovascular;;  ? PERIPHERAL VASCULAR CATHETERIZATION N/A 02/20/2015  ? Procedure: IVC Filter Removal;  Surgeon: Algernon Huxley, MD;  Location: [facility];  Service: Cardiovascular;  Laterality: N/A;  ? ?Family History  ?Problem Relation Age of Onset  ? Heart disease Mother   ? Hypertension Mother   ? Ovarian cancer Mother 86  ? Hypertension Sister   ? Hypertension Brother   ? Ovarian cancer Paternal Aunt 67  ? Breast cancer Neg Hx   ? ? ?Allergies: Pravachol [pravastatin] and Penicillins ?Current Outpatient Medications on File Prior to Visit  ?Medication Sig Dispense Refill  ? amLODipine (NORVASC) 2.5 MG tablet Take 1 tablet (2.5  mg total) by mouth daily. 90 tablet 3  ? amLODipine (NORVASC) 5 MG tablet Take 1 tablet (5 mg total) by mouth daily. 90 tablet 3  ? aspirin 325 MG tablet Take 325 mg by mouth daily.    ? budesonide (ENTOCORT EC) 3 MG 24 hr capsule TAKE 3 CAPSULES (9 MG TOTAL) BY MOUTH DAILY. 270 capsule 4  ? cholecalciferol (VITAMIN D) 25 MCG (1000 UNIT) tablet Take 1,000 Units by mouth daily.    ? Cyanocobalamin (VITAMIN B-12) 5000 MCG TBDP Take 5,000 mcg by mouth every other day.     ? dorzolamidel-timolol (COSOPT) 22.3-6.8 MG/ML SOLN ophthalmic solution Place 1 drop into both eyes 2 (two) times daily. Take one drop in both eyes in the morning  & evening before bed.    ? ezetimibe (ZETIA) 10 MG tablet Take 1 tablet (10 mg total) by mouth daily. 90 tablet 3  ? fluticasone (FLONASE) 50 MCG/ACT nasal spray Place 2 sprays into both nostrils daily. 16 g 6  ? hydrALAZINE (APRESOLINE) 10 MG tablet TAKE 1 TABLET BY MOUTH THREE TIMES A DAY 270 tablet 1  ? letrozole (FEMARA) 2.5 MG tablet TAKE 1 TABLET BY MOUTH DAILY 90 tablet 4  ? losartan (COZAAR) 100 MG tablet TAKE 1 TABLET BY MOUTH EVERY DAY IN THE EVENING 90 tablet 1  ? LUMIGAN 0.01 % SOLN Place 1 drop into both eyes at bedtime.     ? metoprolol succinate (TOPROL-XL) 100 MG 24 hr tablet TAKE 1 TABLET (100 MG TOTAL) BY MOUTH DAILY. TAKE WITH OR IMMEDIATELY FOLLOWING A MEAL. APPT NEEDED 90 tablet 3  ? mometasone (ELOCON) 0.1 % cream Apply 1 application topically daily. Appear pea sized ( or less) amount to external ear, and slightly in canal. 15 g 2  ? Multiple Vitamins-Minerals (PRESERVISION AREDS 2 PO) Take 1 tablet by mouth 2 (two) times daily.     ? simethicone (MYLICON) 811 MG chewable tablet Chew 125 mg by mouth every morning.    ? ?No current facility-administered medications on file prior to visit.  ? ? ?Social History  ? ?Tobacco Use  ? Smoking status: Never  ? Smokeless tobacco: Never  ?Substance Use Topics  ? Alcohol use: No  ? Drug use: No  ? ? ?Review of Systems  ?Constitutional:  Negative for chills and fever.  ?Respiratory:  Negative for cough and shortness of breath.   ?Cardiovascular:  Negative for chest pain, palpitations and leg swelling.  ?Gastrointestinal:  Negative for nausea and vomiting.  ?   ?Objective:  ?  ?BP (!) 142/70 (BP Location: Left Arm, Cuff Size: Normal)  ?BP Readings from Last 3 Encounters:  ?06/04/21 (!) 142/70  ?04/07/21 (!) 157/78  ?01/24/21 120/72  ? ?Wt Readings from Last 3 Encounters:  ?06/02/21 153 lb (69.4 kg)  ?04/07/21 153 lb 4 oz (69.5 kg)  ?01/24/21 153 lb 3.2 oz (69.5 kg)  ? ? ?Physical Exam ?Vitals reviewed.  ?Constitutional:   ?   Appearance: She is  well-developed.  ?Eyes:  ?   Conjunctiva/sclera: Conjunctivae normal.  ?Cardiovascular:  ?   Rate and Rhythm: Normal rate and regular rhythm.  ?   Pulses: Normal pulses.  ?   Heart sounds: Normal heart sounds.  ?   Comments: Wearing bilateral compression stockings ?Pulmonary:  ?   Effort: Pulmonary effort is normal.  ?   Breath sounds: Normal breath sounds. No wheezing, rhonchi or rales.  ?Musculoskeletal:  ?   Right lower leg: No edema.  ?  Left lower leg: No edema.  ?Skin: ?   General: Skin is warm and dry.  ?Neurological:  ?   Mental Status: She is alert.  ?Psychiatric:     ?   Speech: Speech normal.     ?   Behavior: Behavior normal.     ?   Thought Content: Thought content normal.  ? ? ?   ?Assessment & Plan:  ? ?Problem List Items Addressed This Visit   ? ?  ? Cardiovascular and Mediastinum  ? Essential hypertension - Primary  ?  Well controlled today. Labile at home. Fortunately she is asymptomatic. Patient left to get her BP cuff from home which was checked for accuracy by CMA and reported accurate. She will continue amlodipine 5 mg every morning, 2.5 mg every night ?, hydralazine 10 mg 3 times daily in the morning,lunch and dinner, losartan 100 mg every night ?We will change Toprol 100 mg  to lunch time to spread out blood pressure medications. Stop demadex as not effective for lowering blood pressure for her and we have opted to trial  hctz 12.'5mg'$  qd prn for BP > 145/80. Pending BMP in one week ?  ?  ? Relevant Medications  ? hydrochlorothiazide (MICROZIDE) 12.5 MG capsule  ? Other Relevant Orders  ? Basic metabolic panel  ? Lipid panel  ?  ? Other  ? Hyperlipidemia, unspecified  ? Relevant Medications  ? hydrochlorothiazide (MICROZIDE) 12.5 MG capsule  ? Other Relevant Orders  ? Lipid panel  ? Prediabetes  ? Relevant Orders  ? Hemoglobin A1c  ? ? ? ?I have discontinued Jakalyn B. Tena's torsemide. I am also having her start on hydrochlorothiazide. Additionally, I am having her maintain her Lumigan,  aspirin, Multiple Vitamins-Minerals (PRESERVISION AREDS 2 PO), simethicone, Vitamin B-12, letrozole, dorzolamidel-timolol, cholecalciferol, fluticasone, mometasone, budesonide, ezetimibe, amLODipine, amLODipine, losartan, m

## 2021-06-04 NOTE — Assessment & Plan Note (Addendum)
Well controlled today. Labile at home. Fortunately she is asymptomatic. Patient left to get her BP cuff from home which was checked for accuracy by CMA and reported accurate. She will continue amlodipine 5 mg every morning, 2.5 mg every night ?, hydralazine 10 mg 3 times daily in the morning,lunch and dinner, losartan 100 mg every night ?We will change Toprol 100 mg  to lunch time to spread out blood pressure medications. Stop demadex as not effective for lowering blood pressure for her and we have opted to trial  hctz 12.'5mg'$  qd prn for BP > 145/80. Pending BMP in one week ?

## 2021-06-26 ENCOUNTER — Other Ambulatory Visit: Payer: Self-pay | Admitting: Family

## 2021-06-26 DIAGNOSIS — I10 Essential (primary) hypertension: Secondary | ICD-10-CM

## 2021-06-26 NOTE — Telephone Encounter (Signed)
Refill sent in to pharmacy 

## 2021-07-24 ENCOUNTER — Encounter: Payer: Self-pay | Admitting: Family

## 2021-07-24 ENCOUNTER — Ambulatory Visit: Payer: Medicare PPO | Admitting: Family

## 2021-07-24 DIAGNOSIS — I10 Essential (primary) hypertension: Secondary | ICD-10-CM

## 2021-07-24 NOTE — Patient Instructions (Addendum)
Continue amlodipine 5 mg every morning, hydralazine 10 mg 3 times daily in the morning,lunch and dinner, losartan 100 mg every night, and  toprol 100 mg  lunch time.   STOP amlodipine 2.5 mg every night to see if leg swelling improves  You may use hctz ( diuretic) 12.'5mg'$  as needed or trial taking every other day for leg swelling or elevated blood pressure.    Follow low salt diet.    DASH Eating Plan DASH stands for Dietary Approaches to Stop Hypertension. The DASH eating plan is a healthy eating plan that has been shown to: Reduce high blood pressure (hypertension). Reduce your risk for type 2 diabetes, heart disease, and stroke. Help with weight loss. What are tips for following this plan? Reading food labels Check food labels for the amount of salt (sodium) per serving. Choose foods with less than 5 percent of the Daily Value of sodium. Generally, foods with less than 300 milligrams (mg) of sodium per serving fit into this eating plan. To find whole grains, look for the word "whole" as the first word in the ingredient list. Shopping Buy products labeled as "low-sodium" or "no salt added." Buy fresh foods. Avoid canned foods and pre-made or frozen meals. Cooking Avoid adding salt when cooking. Use salt-free seasonings or herbs instead of table salt or sea salt. Check with your health care provider or pharmacist before using salt substitutes. Do not fry foods. Cook foods using healthy methods such as baking, boiling, grilling, roasting, and broiling instead. Cook with heart-healthy oils, such as olive, canola, avocado, soybean, or sunflower oil. Meal planning  Eat a balanced diet that includes: 4 or more servings of fruits and 4 or more servings of vegetables each day. Try to fill one-half of your plate with fruits and vegetables. 6-8 servings of whole grains each day. Less than 6 oz (170 g) of lean meat, poultry, or fish each day. A 3-oz (85-g) serving of meat is about the same size  as a deck of cards. One egg equals 1 oz (28 g). 2-3 servings of low-fat dairy each day. One serving is 1 cup (237 mL). 1 serving of nuts, seeds, or beans 5 times each week. 2-3 servings of heart-healthy fats. Healthy fats called omega-3 fatty acids are found in foods such as walnuts, flaxseeds, fortified milks, and eggs. These fats are also found in cold-water fish, such as sardines, salmon, and mackerel. Limit how much you eat of: Canned or prepackaged foods. Food that is high in trans fat, such as some fried foods. Food that is high in saturated fat, such as fatty meat. Desserts and other sweets, sugary drinks, and other foods with added sugar. Full-fat dairy products. Do not salt foods before eating. Do not eat more than 4 egg yolks a week. Try to eat at least 2 vegetarian meals a week. Eat more home-cooked food and less restaurant, buffet, and fast food. Lifestyle When eating at a restaurant, ask that your food be prepared with less salt or no salt, if possible. If you drink alcohol: Limit how much you use to: 0-1 drink a day for women who are not pregnant. 0-2 drinks a day for men. Be aware of how much alcohol is in your drink. In the [place] one drink equals one 12 oz bottle of beer (355 mL), one 5 oz glass of wine (148 mL), or one 1 oz glass of hard liquor (44 mL). General information Avoid eating more than 2,300 mg of salt a day. If  you have hypertension, you may need to reduce your sodium intake to 1,500 mg a day. Work with your health care provider to maintain a healthy body weight or to lose weight. Ask what an ideal weight is for you. Get at least 30 minutes of exercise that causes your heart to beat faster (aerobic exercise) most days of the week. Activities may include walking, swimming, or biking. Work with your health care provider or dietitian to adjust your eating plan to your individual calorie needs. What foods should I eat? Fruits All fresh, dried, or frozen fruit.  Canned fruit in natural juice (without added sugar). Vegetables Fresh or frozen vegetables (raw, steamed, roasted, or grilled). Low-sodium or reduced-sodium tomato and vegetable juice. Low-sodium or reduced-sodium tomato sauce and tomato paste. Low-sodium or reduced-sodium canned vegetables. Grains Whole-grain or whole-wheat bread. Whole-grain or whole-wheat pasta. Brown rice. [place]. Bulgur. Whole-grain and low-sodium cereals. Pita bread. Low-fat, low-sodium crackers. Whole-wheat flour tortillas. Meats and other proteins Skinless chicken or [place]. Ground chicken or [place]. Pork with fat trimmed off. Fish and seafood. Egg whites. Dried beans, peas, or lentils. Unsalted nuts, nut butters, and seeds. Unsalted canned beans. Lean cuts of beef with fat trimmed off. Low-sodium, lean precooked or cured meat, such as sausages or meat loaves. Dairy Low-fat (1%) or fat-free (skim) milk. Reduced-fat, low-fat, or fat-free cheeses. Nonfat, low-sodium ricotta or cottage cheese. Low-fat or nonfat yogurt. Low-fat, low-sodium cheese. Fats and oils Soft margarine without trans fats. Vegetable oil. Reduced-fat, low-fat, or light mayonnaise and salad dressings (reduced-sodium). Canola, safflower, olive, avocado, soybean, and sunflower oils. Avocado. Seasonings and condiments Herbs. Spices. Seasoning mixes without salt. Other foods Unsalted popcorn and pretzels. Fat-free sweets. The items listed above may not be a complete list of foods and beverages you can eat. Contact a dietitian for more information. What foods should I avoid? Fruits Canned fruit in a light or heavy syrup. Fried fruit. Fruit in cream or butter sauce. Vegetables Creamed or fried vegetables. Vegetables in a cheese sauce. Regular canned vegetables (not low-sodium or reduced-sodium). Regular canned tomato sauce and paste (not low-sodium or reduced-sodium). Regular tomato and vegetable juice (not low-sodium or reduced-sodium). Angie Fava.  Olives. Grains Baked goods made with fat, such as croissants, muffins, or some breads. Dry pasta or rice meal packs. Meats and other proteins Fatty cuts of meat. Ribs. Fried meat. Berniece Salines. Bologna, salami, and other precooked or cured meats, such as sausages or meat loaves. Fat from the back of a pig (fatback). Bratwurst. Salted nuts and seeds. Canned beans with added salt. Canned or smoked fish. Whole eggs or egg yolks. Chicken or [place] with skin. Dairy Whole or 2% milk, cream, and half-and-half. Whole or full-fat cream cheese. Whole-fat or sweetened yogurt. Full-fat cheese. Nondairy creamers. Whipped toppings. Processed cheese and cheese spreads. Fats and oils Butter. Stick margarine. Lard. Shortening. Ghee. Bacon fat. Tropical oils, such as coconut, palm kernel, or palm oil. Seasonings and condiments Onion salt, garlic salt, seasoned salt, table salt, and sea salt. Worcestershire sauce. Tartar sauce. Barbecue sauce. Teriyaki sauce. Soy sauce, including reduced-sodium. Steak sauce. Canned and packaged gravies. Fish sauce. Oyster sauce. Cocktail sauce. Store-bought horseradish. Ketchup. Mustard. Meat flavorings and tenderizers. Bouillon cubes. Hot sauces. Pre-made or packaged marinades. Pre-made or packaged taco seasonings. Relishes. Regular salad dressings. Other foods Salted popcorn and pretzels. The items listed above may not be a complete list of foods and beverages you should avoid. Contact a dietitian for more information. Where to find more information National Heart, Lung, and  Blood Institute: https://wilson-eaton.com/ American Heart Association: www.heart.org Academy of Nutrition and Dietetics: www.eatright.[place]: www.kidney.org Summary The DASH eating plan is a healthy eating plan that has been shown to reduce high blood pressure (hypertension). It may also reduce your risk for type 2 diabetes, heart disease, and stroke. When on the DASH eating plan, aim to eat more  fresh fruits and vegetables, whole grains, lean proteins, low-fat dairy, and heart-healthy fats. With the DASH eating plan, you should limit salt (sodium) intake to 2,300 mg a day. If you have hypertension, you may need to reduce your sodium intake to 1,500 mg a day. Work with your health care provider or dietitian to adjust your eating plan to your individual calorie needs. This information is not intended to replace advice given to you by your health care provider. Make sure you discuss any questions you have with your health care provider. Document Revised: 12/30/2018 Document Reviewed: 12/30/2018 Elsevier Patient Education  [employer].

## 2021-07-24 NOTE — Progress Notes (Signed)
Subjective:    [name]: Mia Perez, female    DOB: Oct 18, 1933, 88 y.o.   MRN: [id number]  CC: Mia Perez is a 88 y.o. female who presents today for follow up.   HPI: Feels well today  She continues to notice bilateral ankle and feet swelling for years.   Worse at end of day. Wearing compression stockings. Some indiscretion with sodium No sob, cp, orthopnea, calf pain.     HTN- Compliant amlodipine 5 mg every morning, 2.5 mg every night , hydralazine 10 mg 3 times daily in the morning,lunch and dinner, losartan 100 mg every night, and  toprol 100 mg  lunch time. She has used hctz 12.'5mg'$  prn, reports only 5 times since our last visit    HISTORY:  Past Medical History:  Diagnosis Date   Anemia    H/O   Arthritis    Breast cancer ([place]) 02/10/1996   left, lumpectomy and radiation   Breast cancer ([place]) 2017   right   DVT (deep venous thrombosis) ([place]) 11/2014   GERD (gastroesophageal reflux disease)    Hypertension    Lymphocytic colitis    Personal history of radiation therapy 1998, 2017   BREAST CA   Ulcerative colitis ([place]) 2014   Varicose veins    Past Surgical History:  Procedure Laterality Date   BREAST EXCISIONAL BIOPSY Right 2014   NEG   BREAST LUMPECTOMY Left 1998   radiation   BREAST LUMPECTOMY Right 2017   BREAST LUMPECTOMY WITH SENTINEL LYMPH NODE BIOPSY Right 12/30/2015   8 mm, T1b, N0; RUOQ, ER 95%; PR: 80 %, Her 2 neu not overexpressed.Mammosite Radiation.   BREAST SURGERY Right 2014   Microcalcifications excised with wire localization/ Dr Bary Castilla   COLONOSCOPY  2012   Dr Earlean Shawl   DILATION AND CURETTAGE OF UTERUS     HERNIA REPAIR     HYSTEROSCOPY     MELANOMA EXCISION     middle of chest between breasts, Dr Kellie Moor   NECK SURGERY     PERIPHERAL VASCULAR CATHETERIZATION N/A 12/13/2014   Procedure: IVC Filter Insertion;  Surgeon: Algernon Huxley, MD;  Location: [facility];  Service: Cardiovascular;  Laterality: N/A;    PERIPHERAL VASCULAR CATHETERIZATION  12/13/2014   Procedure: Lower Extremity Intervention;  Surgeon: Algernon Huxley, MD;  Location: [facility];  Service: Cardiovascular;;   PERIPHERAL VASCULAR CATHETERIZATION N/A 02/20/2015   Procedure: IVC Filter Removal;  Surgeon: Algernon Huxley, MD;  Location: [facility];  Service: Cardiovascular;  Laterality: N/A;   Family History  Problem Relation Age of Onset   Heart disease Mother    Hypertension Mother    Ovarian cancer Mother 63   Hypertension Sister    Hypertension Brother    Ovarian cancer Paternal Aunt 85   Breast cancer Neg Hx     Allergies: Pravachol [pravastatin] and Penicillins Current Outpatient Medications on File Prior to Visit  Medication Sig Dispense Refill   amLODipine (NORVASC) 2.5 MG tablet Take 1 tablet (2.5 mg total) by mouth daily. 90 tablet 3   amLODipine (NORVASC) 5 MG tablet Take 1 tablet (5 mg total) by mouth daily. 90 tablet 3   aspirin 325 MG tablet Take 325 mg by mouth daily.     budesonide (ENTOCORT EC) 3 MG 24 hr capsule TAKE 3 CAPSULES (9 MG TOTAL) BY MOUTH DAILY. 270 capsule 4   cholecalciferol (VITAMIN D) 25 MCG (1000 UNIT) tablet Take 1,000 Units by  mouth daily.     Cyanocobalamin (VITAMIN B-12) 5000 MCG TBDP Take 5,000 mcg by mouth every other day.      ezetimibe (ZETIA) 10 MG tablet Take 1 tablet (10 mg total) by mouth daily. 90 tablet 3   fluticasone (FLONASE) 50 MCG/ACT nasal spray Place 2 sprays into both nostrils daily. 16 g 6   hydrALAZINE (APRESOLINE) 10 MG tablet TAKE 1 TABLET BY MOUTH THREE TIMES A DAY 270 tablet 1   hydrochlorothiazide (MICROZIDE) 12.5 MG capsule TAKE 1 CAPSULE (12.5 MG TOTAL) BY MOUTH DAILY AS NEEDED. TAKE FOR BLOOD PRESSURE > 145/80 30 capsule 1   losartan (COZAAR) 100 MG tablet TAKE 1 TABLET BY MOUTH EVERY DAY IN THE EVENING 90 tablet 1   LUMIGAN 0.01 % SOLN Place 1 drop into both eyes at bedtime.      metoprolol succinate (TOPROL-XL) 100 MG 24 hr tablet TAKE 1 TABLET  (100 MG TOTAL) BY MOUTH DAILY. TAKE WITH OR IMMEDIATELY FOLLOWING A MEAL. APPT NEEDED 90 tablet 3   Multiple Vitamins-Minerals (PRESERVISION AREDS 2 PO) Take 1 tablet by mouth 2 (two) times daily.      simethicone (MYLICON) 174 MG chewable tablet Chew 125 mg by mouth every morning.     dorzolamidel-timolol (COSOPT) 22.3-6.8 MG/ML SOLN ophthalmic solution Place 1 drop into both eyes 2 (two) times daily. Take one drop in both eyes in the morning & evening before bed. (Patient not taking: Reported on 07/24/2021)     letrozole ([place]) 2.5 MG tablet TAKE 1 TABLET BY MOUTH DAILY (Patient not taking: Reported on 07/24/2021) 90 tablet 4   mometasone (ELOCON) 0.1 % cream Apply 1 application topically daily. Appear pea sized ( or less) amount to external ear, and slightly in canal. (Patient not taking: Reported on 07/24/2021) 15 g 2   No current facility-administered medications on file prior to visit.    Social History   Tobacco Use   Smoking status: Never   Smokeless tobacco: Never  Substance Use Topics   Alcohol use: No   Drug use: No    Review of Systems  Constitutional:  Negative for chills and fever.  Respiratory:  Negative for cough and shortness of breath.   Cardiovascular:  Positive for leg swelling. Negative for chest pain and palpitations.  Gastrointestinal:  Negative for nausea and vomiting.      Objective:    BP 120/68 (BP Location: Left Arm, Patient Position: Sitting, Cuff Size: Normal)   Pulse 61   Temp 97.8 F (36.6 C) (Oral)   Ht '5\' 7"'$  (1.702 m)   Wt 154 lb 3.2 oz (69.9 kg)   SpO2 95%   BMI 24.15 kg/m  BP Readings from Last 3 Encounters:  07/24/21 120/68  06/04/21 (!) 142/70  04/07/21 (!) 157/78   Wt Readings from Last 3 Encounters:  07/24/21 154 lb 3.2 oz (69.9 kg)  06/02/21 153 lb (69.4 kg)  04/07/21 153 lb 4 oz (69.5 kg)    Physical Exam Vitals reviewed.  Constitutional:      Appearance: She is well-developed.  Eyes:     Conjunctiva/sclera: Conjunctivae  normal.  Cardiovascular:     Rate and Rhythm: Normal rate and regular rhythm.     Pulses: Normal pulses.     Heart sounds: Normal heart sounds.     Comments: Bilateral pedal edema, non pitting +1 No  palpable cords or masses. No erythema or increased warmth. No asymmetry in calf size when compared bilaterally No discoloration or varicosities noted. LE warm  and palpable pedal pulses.  Pulmonary:     Effort: Pulmonary effort is normal.     Breath sounds: Normal breath sounds. No wheezing, rhonchi or rales.  Skin:    General: Skin is warm and dry.  Neurological:     Mental Status: She is alert.  Psychiatric:        Speech: Speech normal.        Behavior: Behavior normal.        Thought Content: Thought content normal.        Assessment & Plan:   Problem List Items Addressed This Visit       Cardiovascular and Mediastinum   Essential hypertension    Blood pressure excellent control. Advised to continue amlodipine 5 mg every morning, hydralazine 10 mg 3 times daily in the morning,lunch and dinner, losartan 100 mg every night, and  toprol 100 mg  lunch time.   Due to leg swelling, advised to stop amlodipine 2.5 mg every night to see if leg swelling improves  She may use hctz 12.'5mg'$  as needed or trial taking every other day for leg swelling or elevated blood pressure if needed.         I am having Mia Perez maintain her Lum[place], aspirin, Multiple Vitamins-Minerals (PRESERVISION AREDS 2 PO), simethicone, Vitamin B-12, letrozole, dorzolamidel-timolol, cholecalciferol, fluticasone, mometasone, budesonide, ezetimibe, amLODipine, amLODipine, losartan, metoprolol succinate, hydrALAZINE, and hydrochlorothiazide.   No orders of the defined types were placed in this encounter.   Return precautions given.   Risks, benefits, and alternatives of the medications and treatment plan prescribed today were discussed, and patient expressed understanding.   Education regarding  symptom management and diagnosis given to patient on AVS.  Continue to follow with Burnard Hawthorne, FNP for routine health maintenance.   Mia Perez and I agreed with plan.   Mable Paris, FNP

## 2021-07-25 ENCOUNTER — Ambulatory Visit: Payer: Medicare PPO | Admitting: Family

## 2021-07-25 NOTE — Assessment & Plan Note (Addendum)
Blood pressure excellent control. Advised to continue amlodipine 5 mg every morning, hydralazine 10 mg 3 times daily in the morning,lunch and dinner, losartan 100 mg every night, and  toprol 100 mg  lunch time.   Due to leg swelling, advised to stop amlodipine 2.5 mg every night to see if leg swelling improves  She may use hctz 12.'5mg'$  as needed or trial taking every other day for leg swelling or elevated blood pressure if needed.

## 2021-07-28 ENCOUNTER — Other Ambulatory Visit: Payer: Self-pay | Admitting: Family

## 2021-07-28 DIAGNOSIS — I10 Essential (primary) hypertension: Secondary | ICD-10-CM

## 2021-07-30 DIAGNOSIS — Z0100 Encounter for examination of eyes and vision without abnormal findings: Secondary | ICD-10-CM | POA: Diagnosis not present

## 2021-07-30 DIAGNOSIS — H353132 Nonexudative age-related macular degeneration, bilateral, intermediate dry stage: Secondary | ICD-10-CM | POA: Diagnosis not present

## 2021-08-05 ENCOUNTER — Other Ambulatory Visit (INDEPENDENT_AMBULATORY_CARE_PROVIDER_SITE_OTHER): Payer: Medicare PPO

## 2021-08-05 DIAGNOSIS — I10 Essential (primary) hypertension: Secondary | ICD-10-CM

## 2021-08-05 DIAGNOSIS — R7303 Prediabetes: Secondary | ICD-10-CM

## 2021-08-05 DIAGNOSIS — E785 Hyperlipidemia, unspecified: Secondary | ICD-10-CM

## 2021-08-05 LAB — BASIC METABOLIC PANEL
BUN: 39 mg/dL — ABNORMAL HIGH (ref 6–23)
CO2: 24 mEq/L (ref 19–32)
Calcium: 9.4 mg/dL (ref 8.4–10.5)
Chloride: 103 mEq/L (ref 96–112)
Creatinine, Ser: 1.57 mg/dL — ABNORMAL HIGH (ref 0.40–1.20)
GFR: 29.9 mL/min — ABNORMAL LOW (ref >60.00)
Glucose, Bld: 88 mg/dL (ref 70–99)
Potassium: 4.1 mEq/L (ref 3.5–5.1)
Sodium: 138 mEq/L (ref 135–145)

## 2021-08-05 LAB — LIPID PANEL
Cholesterol: 169 mg/dL (ref 0–200)
HDL: 41.4 mg/dL (ref >39.00)
LDL Cholesterol: 90 mg/dL (ref 0–99)
NonHDL: 127.98
Total CHOL/HDL Ratio: 4
Triglycerides: 189 mg/dL — ABNORMAL HIGH (ref 0.0–149.0)
VLDL: 37.8 mg/dL (ref 0.0–40.0)

## 2021-08-05 LAB — HEMOGLOBIN A1C: Hgb A1c MFr Bld: 6.2 % (ref 4.6–6.5)

## 2021-08-14 ENCOUNTER — Telehealth: Payer: Self-pay

## 2021-08-14 NOTE — Telephone Encounter (Signed)
LVM to go over results

## 2021-08-14 NOTE — Telephone Encounter (Signed)
I returned patient's call  but was unable to leave message.

## 2021-08-14 NOTE — Telephone Encounter (Signed)
Pt returning call. Pt requesting callback.  °

## 2021-08-19 NOTE — Telephone Encounter (Signed)
Spoke to patient and went over results with her. Patient verbalized understanding.

## 2021-08-22 ENCOUNTER — Other Ambulatory Visit: Payer: Self-pay | Admitting: Family

## 2021-08-22 DIAGNOSIS — I10 Essential (primary) hypertension: Secondary | ICD-10-CM

## 2021-08-28 ENCOUNTER — Other Ambulatory Visit: Payer: Self-pay | Admitting: Family

## 2021-08-28 DIAGNOSIS — I10 Essential (primary) hypertension: Secondary | ICD-10-CM

## 2021-09-11 DIAGNOSIS — H401132 Primary open-angle glaucoma, bilateral, moderate stage: Secondary | ICD-10-CM | POA: Diagnosis not present

## 2021-09-15 DIAGNOSIS — N281 Cyst of kidney, acquired: Secondary | ICD-10-CM | POA: Diagnosis not present

## 2021-09-15 DIAGNOSIS — I10 Essential (primary) hypertension: Secondary | ICD-10-CM | POA: Diagnosis not present

## 2021-09-15 DIAGNOSIS — N1832 Chronic kidney disease, stage 3b: Secondary | ICD-10-CM | POA: Diagnosis not present

## 2021-09-15 DIAGNOSIS — R809 Proteinuria, unspecified: Secondary | ICD-10-CM | POA: Diagnosis not present

## 2021-09-15 DIAGNOSIS — R600 Localized edema: Secondary | ICD-10-CM | POA: Diagnosis not present

## 2021-09-16 ENCOUNTER — Other Ambulatory Visit: Payer: Self-pay | Admitting: Family

## 2021-09-16 DIAGNOSIS — I10 Essential (primary) hypertension: Secondary | ICD-10-CM

## 2021-09-18 DIAGNOSIS — M1990 Unspecified osteoarthritis, unspecified site: Secondary | ICD-10-CM | POA: Diagnosis not present

## 2021-09-18 DIAGNOSIS — K52832 Lymphocytic colitis: Secondary | ICD-10-CM | POA: Diagnosis not present

## 2021-09-18 DIAGNOSIS — I739 Peripheral vascular disease, unspecified: Secondary | ICD-10-CM | POA: Diagnosis not present

## 2021-09-18 DIAGNOSIS — I129 Hypertensive chronic kidney disease with stage 1 through stage 4 chronic kidney disease, or unspecified chronic kidney disease: Secondary | ICD-10-CM | POA: Diagnosis not present

## 2021-09-18 DIAGNOSIS — H409 Unspecified glaucoma: Secondary | ICD-10-CM | POA: Diagnosis not present

## 2021-09-18 DIAGNOSIS — E785 Hyperlipidemia, unspecified: Secondary | ICD-10-CM | POA: Diagnosis not present

## 2021-09-18 DIAGNOSIS — I951 Orthostatic hypotension: Secondary | ICD-10-CM | POA: Diagnosis not present

## 2021-09-18 DIAGNOSIS — G629 Polyneuropathy, unspecified: Secondary | ICD-10-CM | POA: Diagnosis not present

## 2021-09-18 DIAGNOSIS — H547 Unspecified visual loss: Secondary | ICD-10-CM | POA: Diagnosis not present

## 2021-09-20 ENCOUNTER — Other Ambulatory Visit: Payer: Self-pay | Admitting: Gastroenterology

## 2021-09-22 DIAGNOSIS — R809 Proteinuria, unspecified: Secondary | ICD-10-CM | POA: Diagnosis not present

## 2021-09-22 DIAGNOSIS — N1832 Chronic kidney disease, stage 3b: Secondary | ICD-10-CM | POA: Diagnosis not present

## 2021-09-22 DIAGNOSIS — R600 Localized edema: Secondary | ICD-10-CM | POA: Diagnosis not present

## 2021-09-22 DIAGNOSIS — I129 Hypertensive chronic kidney disease with stage 1 through stage 4 chronic kidney disease, or unspecified chronic kidney disease: Secondary | ICD-10-CM | POA: Diagnosis not present

## 2021-09-22 DIAGNOSIS — N281 Cyst of kidney, acquired: Secondary | ICD-10-CM | POA: Diagnosis not present

## 2021-10-24 ENCOUNTER — Encounter: Payer: Self-pay | Admitting: Family

## 2021-10-24 ENCOUNTER — Ambulatory Visit: Payer: Medicare PPO | Admitting: Family

## 2021-10-24 VITALS — BP 112/68 | HR 58 | Temp 97.9°F | Ht 67.0 in | Wt 150.2 lb

## 2021-10-24 DIAGNOSIS — I10 Essential (primary) hypertension: Secondary | ICD-10-CM | POA: Diagnosis not present

## 2021-10-24 DIAGNOSIS — Z23 Encounter for immunization: Secondary | ICD-10-CM | POA: Diagnosis not present

## 2021-10-24 NOTE — Progress Notes (Signed)
Subjective:    [name]: Mia Perez, female    DOB: 28-Jun-1933, 88 y.o.   MRN: [id number]  CC: Mia Perez is a 88 y.o. female who presents today for follow up.   HPI: She feels well today.  No new complaints  Blood pressures been much better controlled and less labile   HTN - compliant with amlodipine 5 mg every morning, hydralazine 10 mg 3 times daily in the morning,lunch and dinner, losartan 100 mg every night, and  toprol 100 mg  lunch time, hctz 12.'5mg'$  QOD.  No leg swelling, chest  Follow-up Dr. [name] 09/12/2021       HISTORY:  Past Medical History:  Diagnosis Date   Anemia    H/O   Arthritis    Breast cancer (Mia Perez) 02/10/1996   left, lumpectomy and radiation   Breast cancer (Mia Perez) 2017   right   DVT (deep venous thrombosis) (Mia Perez) 11/2014   GERD (gastroesophageal reflux disease)    Hypertension    Lymphocytic colitis    Personal history of radiation therapy 1998, 2017   BREAST CA   Ulcerative colitis (Mia Perez) 2014   Varicose veins    Past Surgical History:  Procedure Laterality Date   BREAST EXCISIONAL BIOPSY Right 2014   NEG   BREAST LUMPECTOMY Left 1998   radiation   BREAST LUMPECTOMY Right 2017   BREAST LUMPECTOMY WITH SENTINEL LYMPH NODE BIOPSY Right 12/30/2015   8 mm, T1b, N0; RUOQ, ER 95%; PR: 80 %, Her 2 neu not overexpressed.Mammosite Radiation.   BREAST SURGERY Right 2014   Microcalcifications excised with wire localization/ Dr Bary Castilla   COLONOSCOPY  2012   Dr Earlean Shawl   DILATION AND CURETTAGE OF UTERUS     HERNIA REPAIR     HYSTEROSCOPY     MELANOMA EXCISION     middle of chest between breasts, Dr Kellie Moor   NECK SURGERY     PERIPHERAL VASCULAR CATHETERIZATION N/A 12/13/2014   Procedure: IVC Filter Insertion;  Surgeon: Algernon Huxley, MD;  Location: [facility];  Service: Cardiovascular;  Laterality: N/A;   PERIPHERAL VASCULAR CATHETERIZATION  12/13/2014   Procedure: Lower Extremity Intervention;  Surgeon: Algernon Huxley, MD;   Location: [facility];  Service: Cardiovascular;;   PERIPHERAL VASCULAR CATHETERIZATION N/A 02/20/2015   Procedure: IVC Filter Removal;  Surgeon: Algernon Huxley, MD;  Location: [facility];  Service: Cardiovascular;  Laterality: N/A;   Family History  Problem Relation Age of Onset   Heart disease Mother    Hypertension Mother    Ovarian cancer Mother 34   Hypertension Sister    Hypertension Brother    Ovarian cancer Paternal Aunt 85   Breast cancer Neg Hx     Allergies: Pravachol [pravastatin] and Penicillins Current Outpatient Medications on File Prior to Visit  Medication Sig Dispense Refill   amLODipine (NORVASC) 5 MG tablet Take 1 tablet (5 mg total) by mouth daily. 90 tablet 3   aspirin 325 MG tablet Take 325 mg by mouth daily.     budesonide (ENTOCORT EC) 3 MG 24 hr capsule TAKE 3 CAPSULES (9 MG TOTAL) BY MOUTH DAILY. 270 capsule 11   cholecalciferol (VITAMIN D) 25 MCG (1000 UNIT) tablet Take 1,000 Units by mouth daily.     Cyanocobalamin (VITAMIN B-12) 5000 MCG TBDP Take 5,000 mcg by mouth every other day.      dorzolamide (TRUSOPT) 2 % ophthalmic solution 1 drop 2 (two) times daily.  dorzolamidel-timolol (COSOPT) 22.3-6.8 MG/ML SOLN ophthalmic solution Place 1 drop into both eyes 2 (two) times daily. Take one drop in both eyes in the morning & evening before bed.     ezetimibe (ZETIA) 10 MG tablet Take 1 tablet (10 mg total) by mouth daily. 90 tablet 3   fluticasone (FLONASE) 50 MCG/ACT nasal spray Place 2 sprays into both nostrils daily. 16 g 6   hydrALAZINE (APRESOLINE) 10 MG tablet TAKE 1 TABLET BY MOUTH THREE TIMES A DAY 270 tablet 1   hydrochlorothiazide (MICROZIDE) 12.5 MG capsule TAKE 1 CAPSULE (12.5 MG TOTAL) BY MOUTH DAILY AS NEEDED. TAKE FOR BLOOD PRESSURE > 145/80 30 capsule 1   letrozole (FEMARA) 2.5 MG tablet TAKE 1 TABLET BY MOUTH DAILY 90 tablet 4   losartan (COZAAR) 100 MG tablet TAKE 1 TABLET BY MOUTH EVERY DAY IN THE EVENING 90 tablet 1    LUMIGAN 0.01 % SOLN Place 1 drop into both eyes at bedtime.      metoprolol succinate (TOPROL-XL) 100 MG 24 hr tablet TAKE 1 TABLET (100 MG TOTAL) BY MOUTH DAILY. TAKE WITH OR IMMEDIATELY FOLLOWING A MEAL. APPT NEEDED 90 tablet 3   mometasone (ELOCON) 0.1 % cream Apply 1 application topically daily. Appear pea sized ( or less) amount to external ear, and slightly in canal. 15 g 2   Multiple Vitamins-Minerals (PRESERVISION AREDS 2 PO) Take 1 tablet by mouth 2 (two) times daily.      simethicone (MYLICON) 387 MG chewable tablet Chew 125 mg by mouth every morning.     timolol (TIMOPTIC) 0.5 % ophthalmic solution 1 drop 2 (two) times daily.     No current facility-administered medications on file prior to visit.    Social History   Tobacco Use   Smoking status: Never   Smokeless tobacco: Never  Substance Use Topics   Alcohol use: No   Drug use: No    Review of Systems  Constitutional:  Negative for chills and fever.  Respiratory:  Negative for cough.   Cardiovascular:  Negative for chest pain, palpitations and leg swelling.  Gastrointestinal:  Negative for nausea and vomiting.      Objective:    BP 112/68 (BP Location: Left Arm, Patient Position: Sitting, Cuff Size: Normal)   Pulse (!) 58   Temp 97.9 F (36.6 C) (Oral)   Ht '5\' 7"'$  (1.702 m)   Wt 150 lb 3.2 oz (68.1 kg)   SpO2 98%   BMI 23.52 kg/m  BP Readings from Last 3 Encounters:  10/24/21 112/68  07/24/21 120/68  06/04/21 (!) 142/70   Wt Readings from Last 3 Encounters:  10/24/21 150 lb 3.2 oz (68.1 kg)  07/24/21 154 lb 3.2 oz (69.9 kg)  06/02/21 153 lb (69.4 kg)    Physical Exam Vitals reviewed.  Constitutional:      Appearance: She is well-developed.  Eyes:     Conjunctiva/sclera: Conjunctivae normal.  Cardiovascular:     Rate and Rhythm: Normal rate and regular rhythm.     Pulses: Normal pulses.     Heart sounds: Normal heart sounds.  Pulmonary:     Effort: Pulmonary effort is normal.     Breath sounds:  Normal breath sounds. No wheezing, rhonchi or rales.  Skin:    General: Skin is warm and dry.  Neurological:     Mental Status: She is alert.  Psychiatric:        Speech: Speech normal.        Behavior: Behavior normal.  Thought Content: Thought content normal.        Assessment & Plan:   Problem List Items Addressed This Visit       Cardiovascular and Mediastinum   Essential hypertension    Chronic, stable. Continue amlodipine 5 mg every morning, hydralazine 10 mg 3 times daily in the morning,lunch and dinner, losartan 100 mg every night, and  toprol 100 mg  lunch time, hctz 12.'5mg'$  QOD.         I am having Mia Perez maintain her Lumigan, aspirin, Multiple Vitamins-Minerals (PRESERVISION AREDS 2 PO), simethicone, Vitamin B-12, letrozole, dorzolamidel-timolol, cholecalciferol, fluticasone, mometasone, ezetimibe, amLODipine, metoprolol succinate, hydrALAZINE, losartan, hydrochlorothiazide, budesonide, dorzolamide, and timolol.   No orders of the defined types were placed in this encounter.   Return precautions given.   Risks, benefits, and alternatives of the medications and treatment plan prescribed today were discussed, and patient expressed understanding.   Education regarding symptom management and diagnosis given to patient on AVS.  Continue to follow with Burnard Hawthorne, FNP for routine health maintenance.   Mia Perez and I agreed with plan.   Mable Paris, FNP

## 2021-10-24 NOTE — Assessment & Plan Note (Addendum)
Chronic, stable. Continue amlodipine 5 mg every morning, hydralazine 10 mg 3 times daily in the morning,lunch and dinner, losartan 100 mg every night, and  toprol 100 mg  lunch time, hctz 12.'5mg'$  QOD.

## 2021-10-29 ENCOUNTER — Other Ambulatory Visit: Payer: Self-pay

## 2021-10-29 DIAGNOSIS — I10 Essential (primary) hypertension: Secondary | ICD-10-CM

## 2021-10-29 MED ORDER — HYDROCHLOROTHIAZIDE 12.5 MG PO CAPS: 12.5000 mg | ORAL_CAPSULE | Freq: Every day | ORAL | 3 refills | Status: DC | PRN

## 2021-11-10 ENCOUNTER — Other Ambulatory Visit: Payer: Self-pay | Admitting: Family

## 2021-11-10 DIAGNOSIS — I10 Essential (primary) hypertension: Secondary | ICD-10-CM

## 2021-11-27 DIAGNOSIS — D2272 Melanocytic nevi of left lower limb, including hip: Secondary | ICD-10-CM | POA: Diagnosis not present

## 2021-11-27 DIAGNOSIS — Z85828 Personal history of other malignant neoplasm of skin: Secondary | ICD-10-CM | POA: Diagnosis not present

## 2021-11-27 DIAGNOSIS — D485 Neoplasm of uncertain behavior of skin: Secondary | ICD-10-CM | POA: Diagnosis not present

## 2021-11-27 DIAGNOSIS — Z85820 Personal history of malignant melanoma of skin: Secondary | ICD-10-CM | POA: Diagnosis not present

## 2021-11-27 DIAGNOSIS — D225 Melanocytic nevi of trunk: Secondary | ICD-10-CM | POA: Diagnosis not present

## 2021-11-27 DIAGNOSIS — Z86006 Personal history of melanoma in-situ: Secondary | ICD-10-CM | POA: Diagnosis not present

## 2021-11-27 DIAGNOSIS — D0462 Carcinoma in situ of skin of left upper limb, including shoulder: Secondary | ICD-10-CM | POA: Diagnosis not present

## 2021-11-27 DIAGNOSIS — L570 Actinic keratosis: Secondary | ICD-10-CM | POA: Diagnosis not present

## 2021-12-18 ENCOUNTER — Other Ambulatory Visit: Payer: Self-pay | Admitting: Family

## 2021-12-18 DIAGNOSIS — E785 Hyperlipidemia, unspecified: Secondary | ICD-10-CM

## 2021-12-26 DIAGNOSIS — D0462 Carcinoma in situ of skin of left upper limb, including shoulder: Secondary | ICD-10-CM | POA: Diagnosis not present

## 2022-01-09 DIAGNOSIS — H353132 Nonexudative age-related macular degeneration, bilateral, intermediate dry stage: Secondary | ICD-10-CM | POA: Diagnosis not present

## 2022-01-09 DIAGNOSIS — H401111 Primary open-angle glaucoma, right eye, mild stage: Secondary | ICD-10-CM | POA: Diagnosis not present

## 2022-01-21 ENCOUNTER — Telehealth: Payer: Self-pay | Admitting: Family

## 2022-01-21 NOTE — Telephone Encounter (Signed)
Patient called back and was scheduled at [facility] on 01/23/22.

## 2022-01-21 NOTE — Telephone Encounter (Signed)
Pt called stating her BP has been running high. Pt stated right now her BP is 165/72. Sent to access nurse

## 2022-01-22 DIAGNOSIS — H401132 Primary open-angle glaucoma, bilateral, moderate stage: Secondary | ICD-10-CM | POA: Diagnosis not present

## 2022-01-22 NOTE — Telephone Encounter (Signed)
See below

## 2022-01-23 ENCOUNTER — Encounter: Payer: Self-pay | Admitting: Family Medicine

## 2022-01-23 ENCOUNTER — Ambulatory Visit: Payer: Medicare PPO | Admitting: Family Medicine

## 2022-01-23 VITALS — BP 140/60 | HR 68 | Temp 98.2°F | Ht 67.0 in | Wt 152.1 lb

## 2022-01-23 DIAGNOSIS — I10 Essential (primary) hypertension: Secondary | ICD-10-CM | POA: Diagnosis not present

## 2022-01-23 NOTE — Progress Notes (Signed)
[name]: Mia Perez, female    DOB: [birth year], 88 y.o.   MRN: [id number]  This visit was conducted in person.  BP (!) 140/60   Pulse 68   Temp 98.2 F (36.8 C) (Oral)   Ht '5\' 7"'$  (1.702 m)   Wt 152 lb 2 oz (69 kg)   SpO2 95%   BMI 23.83 kg/m    CC:  Chief Complaint  Patient presents with   Hypertension    Subjective:   HPI: Mia Perez is a 88 y.o. female patient of Mable Paris with history of chronic kidney disease, hypertension, DVT, prediabetes, On chronic steroids for lymphocytic colitis and breast cancer presenting on 01/23/2022 for Hypertension   Hypertension:  She reports recent blood pressure elevations despite hydrochlorothiazide 12.5 mg every other day, hydralazine 10 mg 3 times daily,  and amlodipine 5 mg p.o. daily, losartan 100 mg , toprol XL 100 mg daily   In last 3 days it has been running 125-165/6073  No HA, mild lightheadedness, no new neuro  changes   No change in diet, salt, caffeine,  no alcohol.. some increase in stress. Using medication without problems or lightheadedness:  see above Chest pain with exertion:none Edema: stable , no change. Short of breath:  none Average home BPs: see above Other issues:  BP Readings from Last 3 Encounters:  01/23/22 (!) 140/60  10/24/21 112/68  07/24/21 120/68         Relevant past medical, surgical, family and social history reviewed and updated as indicated. Interim medical history since our last visit reviewed. Allergies and medications reviewed and updated. Outpatient Medications Prior to Visit  Medication Sig Dispense Refill   amLODipine (NORVASC) 5 MG tablet Take 1 tablet (5 mg total) by mouth daily. 90 tablet 3   aspirin 325 MG tablet Take 325 mg by mouth daily.     budesonide (ENTOCORT EC) 3 MG 24 hr capsule TAKE 3 CAPSULES (9 MG TOTAL) BY MOUTH DAILY. (Patient taking differently: Take 3 mg by mouth every other day.) 270 capsule 11   cholecalciferol (VITAMIN D) 25 MCG (1000  UNIT) tablet Take 1,000 Units by mouth daily.     Cyanocobalamin (VITAMIN B-12) 5000 MCG TBDP Take 5,000 mcg by mouth every other day.      dorzolamidel-timolol (COSOPT) 22.3-6.8 MG/ML SOLN ophthalmic solution Place 1 drop into both eyes 2 (two) times daily. Take one drop in both eyes in the morning & evening before bed.     ezetimibe (ZETIA) 10 MG tablet TAKE 1 TABLET BY MOUTH EVERY DAY 90 tablet 3   fluticasone (FLONASE) 50 MCG/ACT nasal spray Place 2 sprays into both nostrils daily. 16 g 6   hydrALAZINE (APRESOLINE) 10 MG tablet TAKE 1 TABLET BY MOUTH THREE TIMES A DAY 270 tablet 1   hydrochlorothiazide (MICROZIDE) 12.5 MG capsule Take 1 capsule (12.5 mg total) by mouth daily as needed. Take for blood pressure > 145/80 30 capsule 3   losartan (COZAAR) 100 MG tablet TAKE 1 TABLET BY MOUTH EVERY DAY IN THE EVENING 90 tablet 1   LUMIGAN 0.01 % SOLN Place 1 drop into both eyes at bedtime.      metoprolol succinate (TOPROL-XL) 100 MG 24 hr tablet TAKE 1 TABLET (100 MG TOTAL) BY MOUTH DAILY. TAKE WITH OR IMMEDIATELY FOLLOWING A MEAL. APPT NEEDED 90 tablet 3   Multiple Vitamins-Minerals (PRESERVISION AREDS 2 PO) Take 1 tablet by mouth 2 (two) times daily.      simethicone (  MYLICON) 397 MG chewable tablet Chew 125 mg by mouth every morning.     timolol (TIMOPTIC) 0.5 % ophthalmic solution 1 drop 2 (two) times daily.     dorzolamide (TRUSOPT) 2 % ophthalmic solution 1 drop 2 (two) times daily.     letrozole (FEMARA) 2.5 MG tablet TAKE 1 TABLET BY MOUTH DAILY 90 tablet 4   mometasone (ELOCON) 0.1 % cream Apply 1 application topically daily. Appear pea sized ( or less) amount to external ear, and slightly in canal. 15 g 2   No facility-administered medications prior to visit.     Per HPI unless specifically indicated in ROS section below Review of Systems  Constitutional:  Negative for fatigue and fever.  HENT:  Negative for congestion.   Eyes:  Negative for pain.  Respiratory:  Negative for cough  and shortness of breath.   Cardiovascular:  Negative for chest pain, palpitations and leg swelling.  Gastrointestinal:  Negative for abdominal pain.  Genitourinary:  Negative for dysuria and vaginal bleeding.  Musculoskeletal:  Negative for back pain.  Neurological:  Negative for syncope, light-headedness and headaches.  Psychiatric/Behavioral:  Negative for dysphoric mood.    Objective:  BP (!) 140/60   Pulse 68   Temp 98.2 F (36.8 C) (Oral)   Ht '5\' 7"'$  (1.702 m)   Wt 152 lb 2 oz (69 kg)   SpO2 95%   BMI 23.83 kg/m   Wt Readings from Last 3 Encounters:  01/23/22 152 lb 2 oz (69 kg)  10/24/21 150 lb 3.2 oz (68.1 kg)  07/24/21 154 lb 3.2 oz (69.9 kg)      Physical Exam Constitutional:      General: She is not in acute distress.    Appearance: Normal appearance. She is well-developed. She is not ill-appearing or toxic-appearing.  HENT:     Head: Normocephalic.     Right Ear: Hearing, tympanic membrane, ear canal and external ear normal. Tympanic membrane is not erythematous, retracted or bulging.     Left Ear: Hearing, tympanic membrane, ear canal and external ear normal. Tympanic membrane is not erythematous, retracted or bulging.     Nose: No mucosal edema or rhinorrhea.     Right Sinus: No maxillary sinus tenderness or frontal sinus tenderness.     Left Sinus: No maxillary sinus tenderness or frontal sinus tenderness.     Mouth/Throat:     Pharynx: Uvula midline.  Eyes:     General: Lids are normal. Lids are everted, no foreign bodies appreciated.     Conjunctiva/sclera: Conjunctivae normal.     Pupils: Pupils are equal, round, and reactive to light.  Neck:     Thyroid: No thyroid mass or thyromegaly.     Vascular: No carotid bruit.     Trachea: Trachea normal.  Cardiovascular:     Rate and Rhythm: Normal rate and regular rhythm.     Pulses: Normal pulses.     Heart sounds: Normal heart sounds, S1 normal and S2 normal. No murmur heard.    No friction rub. No gallop.   Pulmonary:     Effort: Pulmonary effort is normal. No tachypnea or respiratory distress.     Breath sounds: Normal breath sounds. No decreased breath sounds, wheezing, rhonchi or rales.  Abdominal:     General: Bowel sounds are normal.     Palpations: Abdomen is soft.     Tenderness: There is no abdominal tenderness.  Musculoskeletal:     Cervical back: Normal range of motion and  neck supple.  Skin:    General: Skin is warm and dry.     Findings: No rash.  Neurological:     Mental Status: She is alert.  Psychiatric:        Mood and Affect: Mood is not anxious or depressed.        Speech: Speech normal.        Behavior: Behavior normal. Behavior is cooperative.        Thought Content: Thought content normal.        Judgment: Judgment normal.       Results for orders placed or performed in visit on 08/05/21  Hemoglobin A1c  Result Value Ref Range   Hgb A1c MFr Bld 6.2 4.6 - 6.5 %  Lipid panel  Result Value Ref Range   Cholesterol 169 0 - 200 mg/dL   Triglycerides 189.0 (H) 0.0 - 149.0 mg/dL   HDL 41.40 >39.00 mg/dL   VLDL 37.8 0.0 - 40.0 mg/dL   LDL Cholesterol 90 0 - 99 mg/dL   Total CHOL/HDL Ratio 4    NonHDL 811.57   Basic metabolic panel  Result Value Ref Range   Sodium 138 135 - 145 mEq/L   Potassium 4.1 3.5 - 5.1 mEq/L   Chloride 103 96 - 112 mEq/L   CO2 24 19 - 32 mEq/L   Glucose, Bld 88 70 - 99 mg/dL   BUN 39 (H) 6 - 23 mg/dL   Creatinine, Ser 1.57 (H) 0.40 - 1.20 mg/dL   GFR 29.90 (L) >60.00 mL/min   Calcium 9.4 8.4 - 10.5 mg/dL     COVID 19 screen:  No recent travel or known exposure to COVID19 The patient denies respiratory symptoms of COVID 19 at this time. The importance of social distancing was discussed today.   Assessment and Plan Problem List Items Addressed This Visit     Essential hypertension - Primary    Recent acute increase in blood pressure at home.  Majority of her numbers are at goal less than 150/90.   She is compliant with her  medication.  She is not taking HCTZ every day she is taking it usually every other day as instructed.  No lifestyle changes, minimal salt minimal caffeine and no alcohol.  Some increase in stress. She is minimally symptomatic with her blood pressure when mildly elevated. She will start taking HCTZ 12.5 mg daily consistently and follow blood pressures at home.  She will follow-up with her PCP.  I did mention to her that if she feels her blood pressure is elevated she can take it and if above goal less than 150/90 she can take an additional tablet of HCTZ.          Eliezer Lofts, MD

## 2022-01-23 NOTE — Patient Instructions (Addendum)
Change HCTZ  12.5 mg to take EVERY day in morning.  Check BP more than once daily.  Follow up with PCP in 2 weeks for re-evaluation of blood pressure.

## 2022-01-23 NOTE — Assessment & Plan Note (Signed)
Recent acute increase in blood pressure at home.  Majority of her numbers are at goal less than 150/90.   She is compliant with her medication.  She is not taking HCTZ every day she is taking it usually every other day as instructed.  No lifestyle changes, minimal salt minimal caffeine and no alcohol.  Some increase in stress. She is minimally symptomatic with her blood pressure when mildly elevated. She will start taking HCTZ 12.5 mg daily consistently and follow blood pressures at home.  She will follow-up with her PCP.  I did mention to her that if she feels her blood pressure is elevated she can take it and if above goal less than 150/90 she can take an additional tablet of HCTZ.

## 2022-01-28 ENCOUNTER — Other Ambulatory Visit: Payer: Self-pay | Admitting: Family

## 2022-01-28 DIAGNOSIS — I10 Essential (primary) hypertension: Secondary | ICD-10-CM

## 2022-02-19 ENCOUNTER — Other Ambulatory Visit: Payer: Self-pay | Admitting: Family

## 2022-02-19 DIAGNOSIS — I10 Essential (primary) hypertension: Secondary | ICD-10-CM

## 2022-02-23 ENCOUNTER — Other Ambulatory Visit: Payer: Self-pay | Admitting: Family

## 2022-02-23 DIAGNOSIS — I10 Essential (primary) hypertension: Secondary | ICD-10-CM

## 2022-02-25 ENCOUNTER — Encounter: Payer: Self-pay | Admitting: Family

## 2022-02-25 ENCOUNTER — Ambulatory Visit: Payer: Medicare PPO | Admitting: Family

## 2022-02-25 VITALS — BP 116/74 | HR 64 | Temp 97.7°F | Ht 67.0 in | Wt 150.6 lb

## 2022-02-25 DIAGNOSIS — C50912 Malignant neoplasm of unspecified site of left female breast: Secondary | ICD-10-CM | POA: Diagnosis not present

## 2022-02-25 DIAGNOSIS — C50911 Malignant neoplasm of unspecified site of right female breast: Secondary | ICD-10-CM | POA: Diagnosis not present

## 2022-02-25 DIAGNOSIS — H9202 Otalgia, left ear: Secondary | ICD-10-CM | POA: Diagnosis not present

## 2022-02-25 DIAGNOSIS — I10 Essential (primary) hypertension: Secondary | ICD-10-CM | POA: Diagnosis not present

## 2022-02-25 DIAGNOSIS — J309 Allergic rhinitis, unspecified: Secondary | ICD-10-CM | POA: Diagnosis not present

## 2022-02-25 DIAGNOSIS — E785 Hyperlipidemia, unspecified: Secondary | ICD-10-CM | POA: Diagnosis not present

## 2022-02-25 DIAGNOSIS — R7309 Other abnormal glucose: Secondary | ICD-10-CM | POA: Diagnosis not present

## 2022-02-25 DIAGNOSIS — Z1231 Encounter for screening mammogram for malignant neoplasm of breast: Secondary | ICD-10-CM | POA: Diagnosis not present

## 2022-02-25 DIAGNOSIS — Z780 Asymptomatic menopausal state: Secondary | ICD-10-CM

## 2022-02-25 LAB — POCT GLYCOSYLATED HEMOGLOBIN (HGB A1C): Hemoglobin A1C: 5.8 % — AB (ref 4.0–5.6)

## 2022-02-25 MED ORDER — FLUTICASONE PROPIONATE 50 MCG/ACT NA SUSP: 2.0000 | Freq: Every day | NASAL | 6 refills | Status: DC

## 2022-02-25 MED ORDER — METOPROLOL SUCCINATE ER 100 MG PO TB24: ORAL_TABLET | ORAL | 3 refills | Status: DC

## 2022-02-25 MED ORDER — AMLODIPINE BESYLATE 5 MG PO TABS: 5.0000 mg | ORAL_TABLET | Freq: Every day | ORAL | 3 refills | Status: DC

## 2022-02-25 MED ORDER — HYDRALAZINE HCL 10 MG PO TABS: 10.0000 mg | ORAL_TABLET | Freq: Three times a day (TID) | ORAL | 3 refills | Status: DC

## 2022-02-25 NOTE — Progress Notes (Signed)
Assessment & Plan:  Elevated glucose -     POCT glycosylated hemoglobin (Hb A1C)  Essential hypertension Assessment & Plan: Chronic, stable. Continue amlodipine 5 mg every morning, hydralazine 10 mg 3 times daily in the morning,lunch and dinner, losartan 100 mg every night,   toprol 100 mg  lunch time, and hctz 12.'5mg'$  QD.  Advised that she could take an additional dose of hydrochlorothiazide 12.5 mg for blood pressure persistently greater than 140/80 for maximum dose of 25 mg/day.   Orders: -     amLODIPine Besylate; Take 1 tablet (5 mg total) by mouth daily.  Dispense: 90 tablet; Refill: 3 -     Metoprolol Succinate ER; Take with or immediately following a meal.  Dispense: 90 tablet; Refill: 3 -     hydrALAZINE HCl; Take 1 tablet (10 mg total) by mouth 3 (three) times daily.  Dispense: 270 tablet; Refill: 3  Hyperlipidemia, unspecified hyperlipidemia type  Bilateral malignant neoplasm of breast in female, unspecified estrogen receptor status, unspecified site of breast ([place])  Acute otalgia, left  Allergic rhinitis, unspecified seasonality, unspecified trigger -     Fluticasone Propionate; Place 2 sprays into both nostrils daily.  Dispense: 16 g; Refill: 6     Return precautions given.   Risks, benefits, and alternatives of the medications and treatment plan prescribed today were discussed, and patient expressed understanding.   Education regarding symptom management and diagnosis given to patient on AVS either electronically or printed.  Return in about 4 months (around 06/26/2022).  Mable Paris, FNP  Subjective:    [name]: Mia Perez, female    DOB: Apr 06, 1933, 89 y.o.   MRN: [id number]  CC: Mia Perez is a 89 y.o. female who presents today for follow up.   HPI: Feels well today.  No new complaints.  BP at home 120-156/72-80 . BP fluctuates. BP has been lower since starting hctz 12.'5mg'$  qd.   No cp, dizziness, sob  She follows up with nephrology  next month.        Since seeing Dr. Diona Browner 1 month ago she has been taking hydrochlorothiazide 12.5 mg daily.  She is compliant with amlodipine 5 mg daily, losartan 100 mg daily, metoprolol '100mg'$  qd, hydralazine '10mg'$  tid.   No cp, sob.      Allergies: Pravachol [pravastatin] and Penicillins Current Outpatient Medications on File Prior to Visit  Medication Sig Dispense Refill   aspirin 325 MG tablet Take 325 mg by mouth daily.     budesonide (ENTOCORT EC) 3 MG 24 hr capsule TAKE 3 CAPSULES (9 MG TOTAL) BY MOUTH DAILY. (Patient taking differently: Take 3 mg by mouth every other day.) 270 capsule 11   cholecalciferol (VITAMIN D) 25 MCG (1000 UNIT) tablet Take 1,000 Units by mouth daily.     Cyanocobalamin (VITAMIN B-12) 5000 MCG TBDP Take 5,000 mcg by mouth every other day.      dorzolamidel-timolol (COSOPT) 22.3-6.8 MG/ML SOLN ophthalmic solution Place 1 drop into both eyes 2 (two) times daily. Take one drop in both eyes in the morning & evening before bed.     ezetimibe (ZETIA) 10 MG tablet TAKE 1 TABLET BY MOUTH EVERY DAY 90 tablet 3   hydrochlorothiazide (MICROZIDE) 12.5 MG capsule TAKE 1 CAPSULE (12.5 MG TOTAL) BY MOUTH DAILY AS NEEDED. TAKE FOR BLOOD PRESSURE > 145/80 90 capsule 1   losartan (COZAAR) 100 MG tablet TAKE 1 TABLET BY MOUTH EVERY DAY IN THE EVENING 90 tablet 1   LUMIGAN 0.01 %  SOLN Place 1 drop into both eyes at bedtime.      Multiple Vitamins-Minerals (PRESERVISION AREDS 2 PO) Take 1 tablet by mouth 2 (two) times daily.      simethicone (MYLICON) 568 MG chewable tablet Chew 125 mg by mouth every morning.     timolol (TIMOPTIC) 0.5 % ophthalmic solution 1 drop 2 (two) times daily.     No current facility-administered medications on file prior to visit.    Review of Systems  Constitutional:  Negative for chills and fever.  Respiratory:  Negative for cough.   Cardiovascular:  Negative for chest pain and palpitations.  Gastrointestinal:  Negative for nausea and  vomiting.      Objective:    BP 116/74   Pulse 64   Temp 97.7 F (36.5 C)   Ht '5\' 7"'$  (1.702 m)   Wt 150 lb 9.6 oz (68.3 kg)   SpO2 98%   BMI 23.59 kg/m  BP Readings from Last 3 Encounters:  02/25/22 116/74  01/23/22 (!) 140/60  10/24/21 112/68   Wt Readings from Last 3 Encounters:  02/25/22 150 lb 9.6 oz (68.3 kg)  01/23/22 152 lb 2 oz (69 kg)  10/24/21 150 lb 3.2 oz (68.1 kg)    Physical Exam Vitals reviewed.  Constitutional:      Appearance: She is well-developed.  Eyes:     Conjunctiva/sclera: Conjunctivae normal.  Cardiovascular:     Rate and Rhythm: Normal rate and regular rhythm.     Pulses: Normal pulses.     Heart sounds: Normal heart sounds.  Pulmonary:     Effort: Pulmonary effort is normal.     Breath sounds: Normal breath sounds. No wheezing, rhonchi or rales.  Skin:    General: Skin is warm and dry.  Neurological:     Mental Status: She is alert.  Psychiatric:        Speech: Speech normal.        Behavior: Behavior normal.        Thought Content: Thought content normal.

## 2022-02-25 NOTE — Assessment & Plan Note (Signed)
Chronic, stable. Continue amlodipine 5 mg every morning, hydralazine 10 mg 3 times daily in the morning,lunch and dinner, losartan 100 mg every night,   toprol 100 mg  lunch time, and hctz 12.'5mg'$  QD.  Advised that she could take an additional dose of hydrochlorothiazide 12.5 mg for blood pressure persistently greater than 140/80 for maximum dose of 25 mg/day.

## 2022-02-25 NOTE — Patient Instructions (Signed)
Please let me know if vaginal bleeding persists   please call  and schedule your 3D mammogram and /or bone density scan as we discussed.   [facility]  ( new location in 2023)  [address], [place], [place] [address]  [place], [place]  [phone]   I have ordered transvaginal ultrasound.  Let us know if you dont hear back within a week in regards to an appointment being scheduled.   So that you are aware, if you are [facility] MyChart user , please pay attention to your MyChart messages as you may receive a MyChart message with a phone number to call and schedule this test/appointment own your own from our referral coordinator. This is a new process so I do not want you to miss this message.  If you are not a MyChart user, you will receive a phone call.   

## 2022-02-26 ENCOUNTER — Ambulatory Visit
Admission: RE | Admit: 2022-02-26 | Discharge: 2022-02-26 | Disposition: A | Discharge status: Home / Self Care | Payer: Medicare PPO | Source: Ambulatory Visit | Attending: Family | Admitting: Family

## 2022-02-26 DIAGNOSIS — Z1231 Encounter for screening mammogram for malignant neoplasm of breast: Secondary | ICD-10-CM

## 2022-02-26 DIAGNOSIS — Z780 Asymptomatic menopausal state: Secondary | ICD-10-CM | POA: Insufficient documentation

## 2022-02-26 DIAGNOSIS — M8589 Other specified disorders of bone density and structure, multiple sites: Secondary | ICD-10-CM | POA: Diagnosis not present

## 2022-03-18 DIAGNOSIS — R809 Proteinuria, unspecified: Secondary | ICD-10-CM | POA: Diagnosis not present

## 2022-03-18 DIAGNOSIS — I129 Hypertensive chronic kidney disease with stage 1 through stage 4 chronic kidney disease, or unspecified chronic kidney disease: Secondary | ICD-10-CM | POA: Diagnosis not present

## 2022-03-18 DIAGNOSIS — R600 Localized edema: Secondary | ICD-10-CM | POA: Diagnosis not present

## 2022-03-18 DIAGNOSIS — N1832 Chronic kidney disease, stage 3b: Secondary | ICD-10-CM | POA: Diagnosis not present

## 2022-03-18 DIAGNOSIS — N281 Cyst of kidney, acquired: Secondary | ICD-10-CM | POA: Diagnosis not present

## 2022-03-25 DIAGNOSIS — N1832 Chronic kidney disease, stage 3b: Secondary | ICD-10-CM | POA: Diagnosis not present

## 2022-03-25 DIAGNOSIS — R600 Localized edema: Secondary | ICD-10-CM | POA: Diagnosis not present

## 2022-03-25 DIAGNOSIS — N281 Cyst of kidney, acquired: Secondary | ICD-10-CM | POA: Diagnosis not present

## 2022-03-25 DIAGNOSIS — I129 Hypertensive chronic kidney disease with stage 1 through stage 4 chronic kidney disease, or unspecified chronic kidney disease: Secondary | ICD-10-CM | POA: Diagnosis not present

## 2022-04-24 ENCOUNTER — Ambulatory Visit: Payer: Medicare PPO | Admitting: Family

## 2022-05-04 NOTE — Telephone Encounter (Signed)
Error

## 2022-06-01 ENCOUNTER — Telehealth: Payer: Self-pay | Admitting: Family

## 2022-06-01 NOTE — Telephone Encounter (Signed)
Contacted Erin Sons to schedule their annual wellness visit. Appointment made for 06/05/2022.  Thank you,  [facility] Support [facility] Direct dial  [phone]

## 2022-06-05 ENCOUNTER — Ambulatory Visit (INDEPENDENT_AMBULATORY_CARE_PROVIDER_SITE_OTHER): Payer: Medicare PPO

## 2022-06-05 VITALS — Ht 67.0 in | Wt 150.0 lb

## 2022-06-05 DIAGNOSIS — Z0000 Encounter for general adult medical examination without abnormal findings: Secondary | ICD-10-CM | POA: Diagnosis not present

## 2022-06-05 NOTE — Patient Instructions (Addendum)
[name] , Thank you for taking time to come for your Medicare Wellness Visit. I appreciate your ongoing commitment to your health goals. Please review the following plan we discussed and let me know if I can assist you in the future.   These are the goals we discussed:  Goals       Patient Stated     Maintain Healthy Lifestyle (pt-stated)      Stay hydrated. Stay active. Healthy diet.      Other     Increase physical activity      Use strength training exercises Cardio        This is a list of the screening recommended for you and due dates:  Health Maintenance  Topic Date Due   DTaP/Tdap/Td vaccine (2 - Td or Tdap) 08/05/2019   COVID-19 Vaccine (3 - Pfizer risk series) 06/21/2022*   Zoster (Shingles) Vaccine (1 of 2) 09/04/2022*   Flu Shot  09/10/2022   Medicare Annual Wellness Visit  06/05/2023   Pneumonia Vaccine  Completed   DEXA scan (bone density measurement)  Completed   HPV Vaccine  Aged Out  *Topic was postponed. The date shown is not the original due date.    Advanced directives: End of life planning; Advance aging; Advanced directives discussed.  Copy of current HCPOA/Living Will requested.    Conditions/risks identified: none new  Next appointment: Follow up in one year for your annual wellness visit    Preventive Care 65 Years and Older, Female Preventive care refers to lifestyle choices and visits with your health care provider that can promote health and wellness. What does preventive care include? A yearly physical exam. This is also called an annual well check. Dental exams once or twice a year. Routine eye exams. Ask your health care provider how often you should have your eyes checked. Personal lifestyle choices, including: Daily care of your teeth and gums. Regular physical activity. Eating a healthy diet. Avoiding tobacco and drug use. Limiting alcohol use. Practicing safe sex. Taking low-dose aspirin every day. Taking vitamin and  mineral supplements as recommended by your health care provider. What happens during an annual well check? The services and screenings done by your health care provider during your annual well check will depend on your age, overall health, lifestyle risk factors, and family history of disease. Counseling  Your health care provider may ask you questions about your: Alcohol use. Tobacco use. Drug use. Emotional well-being. Home and relationship well-being. Sexual activity. Eating habits. History of falls. Memory and ability to understand (cognition). Work and work Astronomer. Reproductive health. Screening  You may have the following tests or measurements: Height, weight, and BMI. Blood pressure. Lipid and cholesterol levels. These may be checked every 5 years, or more frequently if you are over 37 years old. Skin check. Lung cancer screening. You may have this screening every year starting at age 28 if you have a 30-pack-year history of smoking and currently smoke or have quit within the past 15 years. Fecal occult blood test (FOBT) of the stool. You may have this test every year starting at age 38. Flexible sigmoidoscopy or colonoscopy. You may have a sigmoidoscopy every 5 years or a colonoscopy every 10 years starting at age 46. Hepatitis C blood test. Hepatitis B blood test. Sexually transmitted disease (STD) testing. Diabetes screening. This is done by checking your blood sugar (glucose) after you have not eaten for a while (fasting). You may have this done every 1-3 years. Bone  density scan. This is done to screen for osteoporosis. You may have this done starting at age 41. Mammogram. This may be done every 1-2 years. Talk to your health care provider about how often you should have regular mammograms. Talk with your health care provider about your test results, treatment options, and if necessary, the need for more tests. Vaccines  Your health care provider may recommend  certain vaccines, such as: Influenza vaccine. This is recommended every year. Tetanus, diphtheria, and acellular pertussis (Tdap, Td) vaccine. You may need a Td booster every 10 years. Zoster vaccine. You may need this after age 41. Pneumococcal 13-valent conjugate (PCV13) vaccine. One dose is recommended after age 63. Pneumococcal polysaccharide (PPSV23) vaccine. One dose is recommended after age 3. Talk to your health care provider about which screenings and vaccines you need and how often you need them. This information is not intended to replace advice given to you by your health care provider. Make sure you discuss any questions you have with your health care provider. Document Released: 02/22/2015 Document Revised: 10/16/2015 Document Reviewed: 11/27/2014 Elsevier Interactive Patient Education  2017 [place] Prevention in the Home Falls can cause injuries. They can happen to people of all ages. There are many things you can do to make your home safe and to help prevent falls. What can I do on the outside of my home? Regularly fix the edges of walkways and driveways and fix any cracks. Remove anything that might make you trip as you walk through a door, such as a raised step or threshold. Trim any bushes or trees on the path to your home. Use bright outdoor lighting. Clear any walking paths of anything that might make someone trip, such as rocks or tools. Regularly check to see if handrails are loose or broken. Make sure that both sides of any steps have handrails. Any raised decks and porches should have guardrails on the edges. Have any leaves, snow, or ice cleared regularly. Use sand or salt on walking paths during winter. Clean up any spills in your garage right away. This includes oil or grease spills. What can I do in the bathroom? Use night lights. Install grab bars by the toilet and in the tub and shower. Do not use towel bars as grab bars. Use non-skid mats or  decals in the tub or shower. If you need to sit down in the shower, use a plastic, non-slip stool. Keep the floor dry. Clean up any water that spills on the floor as soon as it happens. Remove soap buildup in the tub or shower regularly. Attach bath mats securely with double-sided non-slip rug tape. Do not have throw rugs and other things on the floor that can make you trip. What can I do in the bedroom? Use night lights. Make sure that you have a light by your bed that is easy to reach. Do not use any sheets or blankets that are too big for your bed. They should not hang down onto the floor. Have a firm chair that has side arms. You can use this for support while you get dressed. Do not have throw rugs and other things on the floor that can make you trip. What can I do in the kitchen? Clean up any spills right away. Avoid walking on wet floors. Keep items that you use a lot in easy-to-reach places. If you need to reach something above you, use a strong step stool that has a grab bar. Keep  electrical cords out of the way. Do not use floor polish or wax that makes floors slippery. If you must use wax, use non-skid floor wax. Do not have throw rugs and other things on the floor that can make you trip. What can I do with my stairs? Do not leave any items on the stairs. Make sure that there are handrails on both sides of the stairs and use them. Fix handrails that are broken or loose. Make sure that handrails are as long as the stairways. Check any carpeting to make sure that it is firmly attached to the stairs. Fix any carpet that is loose or worn. Avoid having throw rugs at the top or bottom of the stairs. If you do have throw rugs, attach them to the floor with carpet tape. Make sure that you have a light switch at the top of the stairs and the bottom of the stairs. If you do not have them, ask someone to add them for you. What else can I do to help prevent falls? Wear shoes that: Do not  have high heels. Have rubber bottoms. Are comfortable and fit you well. Are closed at the toe. Do not wear sandals. If you use a stepladder: Make sure that it is fully opened. Do not climb a closed stepladder. Make sure that both sides of the stepladder are locked into place. Ask someone to hold it for you, if possible. Clearly mark and make sure that you can see: Any grab bars or handrails. First and last steps. Where the edge of each step is. Use tools that help you move around (mobility aids) if they are needed. These include: Canes. Walkers. Scooters. Crutches. Turn on the lights when you go into a dark area. Replace any light bulbs as soon as they burn out. Set up your furniture so you have a clear path. Avoid moving your furniture around. If any of your floors are uneven, fix them. If there are any pets around you, be aware of where they are. Review your medicines with your doctor. Some medicines can make you feel dizzy. This can increase your chance of falling. Ask your doctor what other things that you can do to help prevent falls. This information is not intended to replace advice given to you by your health care provider. Make sure you discuss any questions you have with your health care provider. Document Released: 11/22/2008 Document Revised: 07/04/2015 Document Reviewed: 03/02/2014 Elsevier Interactive Patient Education  2017 [employer].

## 2022-06-05 NOTE — Progress Notes (Signed)
Subjective:   Mia Perez is a 89 y.o. female who presents for Medicare Annual (Subsequent) preventive examination.  Review of Systems    No ROS.  Medicare Wellness Virtual Visit.  Visual/audio telehealth visit, UTA vital signs.   See social history for additional risk factors.   Cardiac Risk Factors include: advanced age (>76men, >6 women)     Objective:    Today's Vitals   06/05/22 1054  Weight: 150 lb (68 kg)  Height: 5\' 7"  (1.702 m)   Body mass index is 23.49 kg/m.     06/05/2022   11:01 AM 04/11/2020    1:47 PM 11/30/2019   11:48 AM 01/23/2019    9:52 AM 11/15/2018   12:10 PM 01/17/2018    9:59 AM 01/25/2017    9:45 AM  Advanced Directives  Does Patient Have a Medical Advance Directive? Yes Yes Yes No No No No  Type of Estate agent of [place];Living will  Healthcare Power of Preemption;Living will      Does patient want to make changes to medical advance directive? No - Patient declined No - Patient declined No - Patient declined      Copy of Healthcare Power of Attorney in Chart? No - copy requested  No - copy requested      Would patient like information on creating a medical advance directive?    No - Patient declined No - Patient declined No - Patient declined No - Patient declined    Current Medications (verified) Outpatient Encounter Medications as of 06/05/2022  Medication Sig   amLODipine (NORVASC) 5 MG tablet Take 1 tablet (5 mg total) by mouth daily.   aspirin 325 MG tablet Take 325 mg by mouth daily.   budesonide (ENTOCORT EC) 3 MG 24 hr capsule TAKE 3 CAPSULES (9 MG TOTAL) BY MOUTH DAILY. (Patient taking differently: Take 3 mg by mouth every other day.)   cholecalciferol (VITAMIN D) 25 MCG (1000 UNIT) tablet Take 1,000 Units by mouth daily.   Cyanocobalamin (VITAMIN B-12) 5000 MCG TBDP Take 5,000 mcg by mouth every other day.    dorzolamidel-timolol (COSOPT) 22.3-6.8 MG/ML SOLN ophthalmic solution Place 1 drop into both eyes 2  (two) times daily. Take one drop in both eyes in the morning & evening before bed.   ezetimibe (ZETIA) 10 MG tablet TAKE 1 TABLET BY MOUTH EVERY DAY   fluticasone (FLONASE) 50 MCG/ACT nasal spray Place 2 sprays into both nostrils daily.   hydrALAZINE (APRESOLINE) 10 MG tablet Take 1 tablet (10 mg total) by mouth 3 (three) times daily.   hydrochlorothiazide (MICROZIDE) 12.5 MG capsule TAKE 1 CAPSULE (12.5 MG TOTAL) BY MOUTH DAILY AS NEEDED. TAKE FOR BLOOD PRESSURE > 145/80   losartan (COZAAR) 100 MG tablet TAKE 1 TABLET BY MOUTH EVERY DAY IN THE EVENING   LUMIGAN 0.01 % SOLN Place 1 drop into both eyes at bedtime.    metoprolol succinate (TOPROL-XL) 100 MG 24 hr tablet Take with or immediately following a meal.   Multiple Vitamins-Minerals (PRESERVISION AREDS 2 PO) Take 1 tablet by mouth 2 (two) times daily.    simethicone (MYLICON) 125 MG chewable tablet Chew 125 mg by mouth every morning.   timolol (TIMOPTIC) 0.5 % ophthalmic solution 1 drop 2 (two) times daily.   No facility-administered encounter medications on file as of 06/05/2022.    Allergies (verified) Pravachol [pravastatin] and Penicillins   History: Past Medical History:  Diagnosis Date   Anemia    H/O  Arthritis    Breast cancer (HCC) 02/10/1996   left, lumpectomy and radiation   Breast cancer (HCC) 2017   right   DVT (deep venous thrombosis) (HCC) 11/2014   GERD (gastroesophageal reflux disease)    Hypertension    Lymphocytic colitis    Personal history of radiation therapy 1998, 2017   BREAST CA   Ulcerative colitis (HCC) 2014   Varicose veins    Past Surgical History:  Procedure Laterality Date   BREAST EXCISIONAL BIOPSY Right 2014   NEG   BREAST LUMPECTOMY Left 1998   radiation   BREAST LUMPECTOMY Right 2017   BREAST LUMPECTOMY WITH SENTINEL LYMPH NODE BIOPSY Right 12/30/2015   8 mm, T1b, N0; RUOQ, ER 95%; PR: 80 %, Her 2 neu not overexpressed.Mammosite Radiation.   BREAST SURGERY Right 2014    Microcalcifications excised with wire localization/ Dr Lemar Livings   COLONOSCOPY  2012   Dr Kinnie Scales   DILATION AND CURETTAGE OF UTERUS     HERNIA REPAIR     HYSTEROSCOPY     MELANOMA EXCISION     middle of chest between breasts, Dr Roseanne Kaufman   NECK SURGERY     PERIPHERAL VASCULAR CATHETERIZATION N/A 12/13/2014   Procedure: IVC Filter Insertion;  Surgeon: Annice Needy, MD;  Location: [facility] INVASIVE CV [facility];  Service: Cardiovascular;  Laterality: N/A;   PERIPHERAL VASCULAR CATHETERIZATION  12/13/2014   Procedure: Lower Extremity Intervention;  Surgeon: Annice Needy, MD;  Location: [facility] INVAS[facility] CV [facility];  Service: Cardiovascular;;   PERIPHERAL VASCULAR CATHETERIZATION N/A 02/20/2015   Procedure: IVC Filter Removal;  Surgeon: Annice Needy, MD;  Location: [facility] INVASIVE CV [facility];  Service: Cardiovascular;  Laterality: N/A;   Family History  Problem Relation Age of Onset   Heart disease Mother    Hypertension Mother    Ovarian cancer Mother 36   Hypertension Sister    Hypertension Brother    Ovarian cancer Paternal Aunt 26   Breast cancer Neg Hx    Social History   Socioeconomic History   Marital status: Widowed    Spouse name: Not on file   Number of children: 1   Years of education: Not on file   Highest education level: Not on file  Occupational History   Occupation: Retired   Tobacco Use   Smoking status: Never   Smokeless tobacco: Never  Substance and Sexual Activity   Alcohol use: No   Drug use: No   Sexual activity: Never  Other Topics Concern   Not on file  Social History Narrative   Lives in [place]. Husband passed away 80. Irving Burton, daughter.      Work - [employer] 13 years, then [employer]      Exercise - Curves      Diet - regular.      Social Determinants of Health   Financial Resource Strain: Low Risk  (06/05/2022)   Overall Financial Resource Strain (CARDIA)    Difficulty of Paying Living Expenses: Not hard at all  Food Insecurity: No Food Insecurity  (06/05/2022)   Hunger Vital Sign    Worried About Running Out of Food in the Last Year: Never true    Ran Out of Food in the Last Year: Never true  Transportation Needs: No Transportation Needs (06/05/2022)   PRAPARE - Administrator, Civil Service (Medical): No    Lack of Transportation (Non-Medical): No  Physical Activity: Not on file  Stress: No Stress Concern Present (06/05/2022)  [employer] of Occupational Health - Occupational Stress Questionnaire    Feeling of Stress : Not at all  Social Connections: Unknown (06/05/2022)   Social Connection and Isolation Panel [NHANES]    Frequency of Communication with Friends and Family: More than three times a week    Frequency of Social Gatherings with Friends and Family: Not on file    Attends Religious Services: More than 4 times per year    Active Member of [employer] or Organizations: Yes    Attends Engineer, structural: More than 4 times per year    Marital Status: Not on file    Tobacco Counseling Counseling given: Not Answered   Clinical Intake:  Pre-visit preparation completed: Yes        Diabetes: No  How often do you need to have someone help you when you read instructions, pamphlets, or other written materials from your doctor or pharmacy?: 1 - Never    Interpreter Needed?: No      Activities of Daily Living    06/05/2022   11:02 AM  In your present state of health, do you have any difficulty performing the following activities:  Hearing? 0  Vision? 0  Difficulty concentrating or making decisions? 0  Walking or climbing stairs? 0  Dressing or bathing? 0  Doing errands, shopping? 0  Preparing Food and eating ? N  Using the Toilet? N  In the past six months, have you accidently leaked urine? N  Do you have problems with loss of bowel control? N  Managing your Medications? N  Managing your Finances? N  Housekeeping or managing your Housekeeping? N    Patient Care Team: Allegra Grana, FNP as PCP - General (Family Medicine) Lemar Livings, Merrily Pew, MD (General Surgery) Lemar Livings Merrily Pew, MD (General Surgery) Tommie Sams, DO as Consulting Physician (Family Medicine)  Indicate any recent Medical Services you may have received from other than [facility] providers in the past year (date may be approximate).     Assessment:   This is a routine wellness examination for Mia Perez.  I connected with  Mia Perez on 06/05/22 by a audio enabled telemedicine application and verified that I am speaking with the correct person using two identifiers.  Patient Location: Home  Provider Location: Office/Clinic  I discussed the limitations of evaluation and management by telemedicine. The patient expressed understanding and agreed to proceed.   Hearing/Vision screen Hearing Screening - Comments:: Patient is able to hear conversational tones without difficulty. No issues reported. Vision Screening - Comments:: Follow[facility]  Wears glasses  Visits every 6 months  Dietary issues and exercise activities discussed: Current Exercise Habits: Home exercise routine, Type of exercise: walking, Intensity: Mild Healthy diet Good water intake    Goals Addressed             This Visit's Progress    Increase physical activity       Use strength training exercises Cardio       Depression Screen    06/05/2022   10:58 AM 02/25/2022    3:06 PM 10/24/2021    1:37 PM 07/24/2021   10:29 AM 06/02/2021    2:30 PM 01/24/2021    9:28 AM 01/01/2021   10:26 AM  PHQ 2/9 Scores  PHQ - 2 Score 0 0 0 0 0 0 0  PHQ- 9 Score  1         Fall Risk    06/05/2022   11:01  AM 02/25/2022    3:06 PM 10/24/2021    1:37 PM 07/24/2021   10:29 AM 01/24/2021    9:28 AM  Fall Risk   Falls in the past year? 0 0 0 1 0  Number falls in past yr: 0 0 0 0 0  Injury with Fall? 0 0 0 0 0  Risk for fall due to :  No Fall Risks No Fall Risks No Fall Risks   Follow up Falls evaluation  completed;Falls prevention discussed Falls evaluation completed Falls evaluation completed Falls evaluation completed Falls evaluation completed    FALL RISK PREVENTION PERTAINING TO THE HOME: Home free of loose throw rugs in walkways, pet beds, electrical cords, etc? Yes  Adequate lighting in your home to reduce risk of falls? Yes   ASSISTIVE DEVICES UTILIZED TO PREVENT FALLS: Life alert? Yes  Use of a cane, walker or w/c? No  Grab bars in the bathroom? No  Shower chair or bench in shower? No  Elevated toilet seat or a handicapped toilet? No   TIMED UP AND GO: Was the test performed? No .   Cognitive Function:    01/30/2015    1:35 PM  MMSE - Mini Mental State Exam  Orientation to time 5  Orientation to Place 5  Registration 3  Attention/ Calculation 5  Recall 3  Language- name 2 objects 2  Language- repeat 1  Language- follow 3 step command 3  Language- read & follow direction 1  Write a sentence 1  Copy design 1  Total score 30        06/05/2022   11:07 AM 11/30/2019   11:56 AM 11/15/2018   12:11 PM 01/30/2016    1:51 PM  6CIT Screen  What Year? 0 points 0 points 0 points 0 points  What month? 0 points 0 points 0 points 0 points  What time? 0 points 0 points 0 points 0 points  Count back from 20 0 points  0 points 0 points  Months in reverse 0 points 0 points 0 points 0 points  Repeat phrase 0 points  0 points   Total Score 0 points  0 points     Immunizations Immunization History  Administered Date(s) Administered   Fluad Quad(high Dose 65+) 10/10/2018, 11/18/2020, 10/24/2021   Influenza Split 02/23/2012, 10/19/2015   Influenza, High Dose Seasonal PF 10/06/2017, 10/10/2018, 10/17/2019   Influenza,inj,Quad PF,6+ Mos 03/06/2013, 12/12/2013, 10/16/2014   Influenza-Unspecified 02/23/2012, 03/06/2013, 12/12/2013, 10/16/2014, 10/19/2015, 10/13/2016   PFIZER(Purple Top)SARS-COV-2 Vaccination 02/24/2019, 03/17/2019   Pneumococcal Conjugate-13 03/06/2013    Pneumococcal Polysaccharide-23 08/05/2010   Tdap 08/04/2009   TDAP status: Due, Education has been provided regarding the importance of this vaccine. Advised may receive this vaccine at local pharmacy or Health Dept. Aware to provide a copy of the vaccination record if obtained from local pharmacy or Health Dept. Verbalized acceptance and understanding.  Covid-19 vaccine status: Completed vaccines   Shingrix Completed?: No.    Education has been provided regarding the importance of this vaccine. Patient has been advised to call insurance company to determine out of pocket expense if they have not yet received this vaccine. Advised may also receive vaccine at local pharmacy or Health Dept. Verbalized acceptance and understanding.  Screening Tests Health Maintenance  Topic Date Due   DTaP/Tdap/Td (2 - Td or Tdap) 08/05/2019   COVID-19 Vaccine (3 - Pfizer risk series) 06/21/2022 (Originally 04/14/2019)   Zoster Vaccines- Shingrix (1 of 2) 09/04/2022 (Originally 02/06/1953)   INFLUENZA VACCINE  09/10/2022   Medicare Annual Wellness (AWV)  06/05/2023   Pneumonia Vaccine 51+ Years old  Completed   DEXA SCAN  Completed   HPV VACCINES  Aged Out    Health Maintenance Health Maintenance Due  Topic Date Due   DTaP/Tdap/Td (2 - Td or Tdap) 08/05/2019   Lung Cancer Screening: (Low Dose CT Chest recommended if Age 18-80 years, 30 pack-year currently smoking OR have quit w/in 15years.) does not qualify.   Hepatitis C Screening: does not qualify.  Vision Screening: Recommended annual ophthalmology exams for early detection of glaucoma and other disorders of the eye.  Dental Screening: Recommended annual dental exams for proper oral hygiene  Community Resource Referral / Chronic Care Management: CRR required this visit?  No   CCM required this visit?  No      Plan:     I have personally reviewed and noted the following in the patient's chart:   Medical and social history Use of alcohol,  tobacco or illicit drugs  Current medications and supplements including opioid prescriptions. Patient is not currently taking opioid prescriptions. Functional ability and status Nutritional status Physical activity Advanced directives List of other physicians Hospitalizations, surgeries, and ER visits in previous 12 months Vitals Screenings to include cognitive, depression, and falls Referrals and appointments  In addition, I have reviewed and discussed with patient certain preventive protocols, quality metrics, and best practice recommendations. A written personalized care plan for preventive services as well as general preventive health recommendations were provided to patient.     Cathey Endow, LPN   1/61/0960

## 2022-06-12 ENCOUNTER — Other Ambulatory Visit: Payer: Self-pay

## 2022-06-17 ENCOUNTER — Encounter: Payer: Self-pay | Admitting: Gastroenterology

## 2022-06-17 ENCOUNTER — Ambulatory Visit: Payer: Medicare PPO | Admitting: Gastroenterology

## 2022-06-17 VITALS — BP 152/81 | HR 64 | Temp 97.7°F | Ht 67.0 in | Wt 152.4 lb

## 2022-06-17 DIAGNOSIS — K52832 Lymphocytic colitis: Secondary | ICD-10-CM

## 2022-06-17 NOTE — Progress Notes (Signed)
Mia Repress, MD [address]  [address]  [place], [place] [address]  Main: [phone]  Fax: [phone]    Gastroenterology Consultation  Referring Provider:     Allegra Grana, FNP Primary Care Physician:  Allegra Grana, FNP Primary Gastroenterologist:  Dr. Deneen Harts at [facility] Reason for Consultation:  Lymphocytic colitis        HPI:   Mia Perez is a 89 y.o. female referred by Allegra Grana, FNP  for consultation & management of lymphocytic colitis.    Follow-up visit 06/17/2022 Patient is here for follow-up of lymphocytic colitis.  She is feeling good.  She is taking budesonide 3 mg once daily.  She takes 2 pills when she has two spend longer hours outdoors, her weight has been stable.  She does not have any concerns today.  NSAIDs: None  Antiplts/Anticoagulants/Anti thrombotics: Aspirin 325 mg for history of DVT  GI Procedures: Colonoscopy by Dr. Kinnie Scales at [facility] in 2012 Reports having had an upper endoscopy several years ago  Past Medical History:  Diagnosis Date   Anemia    H/O   Arthritis    Breast cancer (HCC) 02/10/1996   left, lumpectomy and radiation   Breast cancer (HCC) 2017   right   DVT (deep venous thrombosis) (HCC) 11/2014   GERD (gastroesophageal reflux disease)    Hypertension    Lymphocytic colitis    Personal history of radiation therapy 1998, 2017   BREAST CA   Ulcerative colitis (HCC) 2014   Varicose veins     Past Surgical History:  Procedure Laterality Date   BREAST EXCISIONAL BIOPSY Right 2014   NEG   BREAST LUMPECTOMY Left 1998   radiation   BREAST LUMPECTOMY Right 2017   BREAST LUMPECTOMY WITH SENTINEL LYMPH NODE BIOPSY Right 12/30/2015   8 mm, T1b, N0; RUOQ, ER 95%; PR: 80 %, Her 2 neu not overexpressed.Mammosite Radiation.   BREAST SURGERY Right 2014   Microcalcifications excised with wire localization/ Dr Lemar Livings   COLONOSCOPY  2012   Dr Kinnie Scales    DILATION AND CURETTAGE OF UTERUS     HERNIA REPAIR     HYSTEROSCOPY     MELANOMA EXCISION     middle of chest between breasts, Dr Roseanne Kaufman   NECK SURGERY     PERIPHERAL VASCULAR CATHETERIZATION N/A 12/13/2014   Procedure: IVC Filter Insertion;  Surgeon: Annice Needy, MD;  Location: [facility] INVASIVE CV LAB;  Service: Cardiovascular;  Laterality: N/A;   PERIPHERAL VASCULAR CATHETERIZATION  12/13/2014   Procedure: Lower Extremity Intervention;  Surgeon: Annice Needy, MD;  Location: [facility] INVASIVE CV LAB;  Service: Cardiovascular;;   PERIPHERAL VASCULAR CATHETERIZATION N/A 02/20/2015   Procedure: IVC Filter Removal;  Surgeon: Annice Needy, MD;  Location: [facility] INVASIVE CV LAB;  Service: Cardiovascular;  Laterality: N/A;    Current Outpatient Medications:    amLODipine (NORVASC) 5 MG tablet, Take 1 tablet (5 mg total) by mouth daily., Disp: 90 tablet, Rfl: 3   aspirin 325 MG tablet, Take 325 mg by mouth daily., Disp: , Rfl:    budesonide (ENTOCORT EC) 3 MG 24 hr capsule, TAKE 3 CAPSULES (9 MG TOTAL) BY MOUTH DAILY. (Patient taking differently: Take 3 mg by mouth every other day.), Disp: 270 capsule, Rfl: 11   cholecalciferol (VITAMIN D) 25 MCG (1000 UNIT) tablet, Take 1,000 Units by mouth daily., Disp: , Rfl:    Cyanocobalamin (VITAMIN B-12) 5000 MCG  TBDP, Take 5,000 mcg by mouth every other day. , Disp: , Rfl:    dorzolamide (TRUSOPT) 2 % ophthalmic solution, 1 drop 2 (two) times daily., Disp: , Rfl:    dorzolamidel-timolol (COSOPT) 22.3-6.8 MG/ML SOLN ophthalmic solution, Place 1 drop into both eyes 2 (two) times daily. Take one drop in both eyes in the morning & evening before bed., Disp: , Rfl:    ezetimibe (ZETIA) 10 MG tablet, TAKE 1 TABLET BY MOUTH EVERY DAY, Disp: 90 tablet, Rfl: 3   fluticasone (FLONASE) 50 MCG/ACT nasal spray, Place 2 sprays into both nostrils daily., Disp: 16 g, Rfl: 6   hydrALAZINE (APRESOLINE) 10 MG tablet, Take 1 tablet (10 mg total) by mouth 3 (three) times daily.,  Disp: 270 tablet, Rfl: 3   hydrochlorothiazide (MICROZIDE) 12.5 MG capsule, TAKE 1 CAPSULE (12.5 MG TOTAL) BY MOUTH DAILY AS NEEDED. TAKE FOR BLOOD PRESSURE > 145/80, Disp: 90 capsule, Rfl: 1   losartan (COZAAR) 100 MG tablet, TAKE 1 TABLET BY MOUTH EVERY DAY IN THE EVENING, Disp: 90 tablet, Rfl: 1   LUMIGAN 0.01 % SOLN, Place 1 drop into both eyes at bedtime. , Disp: , Rfl:    metoprolol succinate (TOPROL-XL) 100 MG 24 hr tablet, Take with or immediately following a meal., Disp: 90 tablet, Rfl: 3   Multiple Vitamins-Minerals (PRESERVISION AREDS 2 PO), Take 1 tablet by mouth 2 (two) times daily. , Disp: , Rfl:    simethicone (MYLICON) 125 MG chewable tablet, Chew 125 mg by mouth every morning., Disp: , Rfl:    timolol (TIMOPTIC) 0.5 % ophthalmic solution, 1 drop 2 (two) times daily., Disp: , Rfl:    Family History  Problem Relation Age of Onset   Heart disease Mother    Hypertension Mother    Ovarian cancer Mother 52   Hypertension Sister    Hypertension Brother    Ovarian cancer Paternal Aunt 38   Breast cancer Neg Hx      Social History   Tobacco Use   Smoking status: Never   Smokeless tobacco: Never  Substance Use Topics   Alcohol use: No   Drug use: No    Allergies as of 06/17/2022 - Review Complete 06/17/2022  Allergen Reaction Noted   Pravachol [pravastatin]  02/22/2019   Penicillins Rash 02/04/2011    Review of Systems:    All systems reviewed and negative except where noted in HPI.   Physical Exam:  BP (!) 152/81 (BP Location: Left Arm, Patient Position: Sitting, Cuff Size: Normal)   Pulse 64   Temp 97.7 F (36.5 C) (Oral)   Ht 5\' 7"  (1.702 m)   Wt 152 lb 6 oz (69.1 kg)   BMI 23.87 kg/m  No LMP recorded. Patient is postmenopausal.  General:   Alert, moderately built, moderately nourished, pleasant and cooperative in NAD Head:  Normocephalic and atraumatic. Eyes:  Sclera clear, no icterus.   Conjunctiva pink. Ears:  Normal auditory acuity. Nose:  No  deformity, discharge, or lesions. Mouth:  No deformity or lesions,oropharynx pink & moist. Neck:  Supple; no masses or thyromegaly. Lungs:  Respirations even and unlabored.  Clear throughout to auscultation.   No wheezes, crackles, or rhonchi. No acute distress. Heart:  Regular rate and rhythm; no murmurs, clicks, rubs, or gallops. Abdomen:  Normal bowel sounds. Soft, non-tender and non distended, without masses, hepatosplenomegaly or hernias noted.  No guarding or rebound tenderness.   Rectal: Not performed Msk:  Symmetrical without gross deformities. Good, equal movement & strength  bilaterally. Pulses:  Normal pulses noted. Extremities:  No clubbing or edema.  No cyanosis. Neurologic:  Alert and oriented x3;  grossly normal neurologically. Skin:  Intact without significant lesions or rashes. No jaundice. Psych:  Alert and cooperative. Normal mood and affect.  Imaging Studies: No abdominal imaging  Assessment and Plan:   Mia Perez is a 89 y.o. pleasant Caucasian female with history of lymphocytic colitis diagnosed in 2012, has been maintained on low-dose budesonide.  Lymphocytic colitis: Diarrhea has currently resolved Currently maintained on budesonide 3 mg daily, continue the current dose are increased when her diarrhea is worse.  Also, advised patient that she could switch to every other day Continue Lomotil as needed pancreatic fecal elastase levels normal   Follow up as needed  Mia Repress, MD

## 2022-06-29 ENCOUNTER — Ambulatory Visit: Payer: Medicare PPO | Admitting: Family

## 2022-06-29 ENCOUNTER — Encounter: Payer: Self-pay | Admitting: Family

## 2022-06-29 VITALS — BP 132/84 | HR 58 | Temp 97.7°F | Ht 67.0 in | Wt 152.8 lb

## 2022-06-29 DIAGNOSIS — I10 Essential (primary) hypertension: Secondary | ICD-10-CM | POA: Diagnosis not present

## 2022-06-29 NOTE — Assessment & Plan Note (Signed)
Chronic, stable.  Pleased with readings at home.  Continue amlodipine 5 mg every morning, hydralazine 10 mg 3 times daily in the morning,lunch and dinner, losartan 100 mg every night,   toprol 100 mg  lunch time, and hctz 12.5mg  QD.  She can take an additional dose of hydrochlorothiazide 12.5 mg for blood pressure persistently greater than 140/80 for maximum dose of 25 mg/day.

## 2022-06-29 NOTE — Progress Notes (Signed)
Assessment & Plan:  Essential hypertension Assessment & Plan: Chronic, stable.  Pleased with readings at home.  Continue amlodipine 5 mg every morning, hydralazine 10 mg 3 times daily in the morning,lunch and dinner, losartan 100 mg every night,   toprol 100 mg  lunch time, and hctz 12.5mg  QD.  She can take an additional dose of hydrochlorothiazide 12.5 mg for blood pressure persistently greater than 140/80 for maximum dose of 25 mg/day.       Return precautions given.   Risks, benefits, and alternatives of the medications and treatment plan prescribed today were discussed, and patient expressed understanding.   Education regarding symptom management and diagnosis given to patient on AVS either electronically or printed.  Return in about 4 months (around 10/30/2022).  Rennie Plowman, FNP  Subjective:    [name]: Erin Sons, female    DOB: [birth year], 85 y.o.   MRN: [id number]  CC: DIONNE WRITE is a 89 y.o. female who presents today for follow up.   HPI: Feels well today.  No new complaints.  She is monitoring blood pressure at home and consistently running in the 120s and 130s.  She is overall pleased with treatment regimen.     Follow-up nephrology 03/25/2022.  No medication changes made.   Allergies: Pravachol [pravastatin] and Penicillins Current Outpatient Medications on File Prior to Visit  Medication Sig Dispense Refill   amLODipine (NORVASC) 5 MG tablet Take 1 tablet (5 mg total) by mouth daily. 90 tablet 3   aspirin 325 MG tablet Take 325 mg by mouth daily.     budesonide (ENTOCORT EC) 3 MG 24 hr capsule TAKE 3 CAPSULES (9 MG TOTAL) BY MOUTH DAILY. (Patient taking differently: Take 3 mg by mouth every other day.) 270 capsule 11   cholecalciferol (VITAMIN D) 25 MCG (1000 UNIT) tablet Take 1,000 Units by mouth daily.     Cyanocobalamin (VITAMIN B-12) 5000 MCG TBDP Take 5,000 mcg by mouth every other day.      dorzolamide (TRUSOPT) 2 % ophthalmic solution 1  drop 2 (two) times daily.     dorzolamidel-timolol (COSOPT) 22.3-6.8 MG/ML SOLN ophthalmic solution Place 1 drop into both eyes 2 (two) times daily. Take one drop in both eyes in the morning & evening before bed.     ezetimibe (ZETIA) 10 MG tablet TAKE 1 TABLET BY MOUTH EVERY DAY 90 tablet 3   fluticasone (FLONASE) 50 MCG/ACT nasal spray Place 2 sprays into both nostrils daily. 16 g 6   hydrALAZINE (APRESOLINE) 10 MG tablet Take 1 tablet (10 mg total) by mouth 3 (three) times daily. 270 tablet 3   hydrochlorothiazide (MICROZIDE) 12.5 MG capsule TAKE 1 CAPSULE (12.5 MG TOTAL) BY MOUTH DAILY AS NEEDED. TAKE FOR BLOOD PRESSURE > 145/80 90 capsule 1   losartan (COZAAR) 100 MG tablet TAKE 1 TABLET BY MOUTH EVERY DAY IN THE EVENING 90 tablet 1   LUMIGAN 0.01 % SOLN Place 1 drop into both eyes at bedtime.      metoprolol succinate (TOPROL-XL) 100 MG 24 hr tablet Take with or immediately following a meal. 90 tablet 3   Multiple Vitamins-Minerals (PRESERVISION AREDS 2 PO) Take 1 tablet by mouth 2 (two) times daily.      simethicone (MYLICON) 125 MG chewable tablet Chew 125 mg by mouth every morning.     timolol (TIMOPTIC) 0.5 % ophthalmic solution 1 drop 2 (two) times daily.     No current facility-administered medications on file prior to visit.  Review of Systems  Constitutional:  Negative for chills and fever.  Respiratory:  Negative for cough.   Cardiovascular:  Negative for chest pain and palpitations.  Gastrointestinal:  Negative for nausea and vomiting.      Objective:    BP 132/84   Pulse (!) 58   Temp 97.7 F (36.5 C) (Oral)   Ht 5\' 7"  (1.702 m)   Wt 152 lb 12.8 oz (69.3 kg)   SpO2 95%   BMI 23.93 kg/m  BP Readings from Last 3 Encounters:  06/29/22 132/84  06/17/22 (!) 152/81  02/25/22 116/74   Wt Readings from Last 3 Encounters:  06/29/22 152 lb 12.8 oz (69.3 kg)  06/17/22 152 lb 6 oz (69.1 kg)  06/05/22 150 lb (68 kg)    Physical Exam Vitals reviewed.   Constitutional:      Appearance: She is well-developed.  Eyes:     Conjunctiva/sclera: Conjunctivae normal.  Cardiovascular:     Rate and Rhythm: Normal rate and regular rhythm.     Pulses: Normal pulses.     Heart sounds: Normal heart sounds.  Pulmonary:     Effort: Pulmonary effort is normal.     Breath sounds: Normal breath sounds. No wheezing, rhonchi or rales.  Skin:    General: Skin is warm and dry.  Neurological:     Mental Status: She is alert.  Psychiatric:        Speech: Speech normal.        Behavior: Behavior normal.        Thought Content: Thought content normal.

## 2022-07-27 DIAGNOSIS — E785 Hyperlipidemia, unspecified: Secondary | ICD-10-CM | POA: Diagnosis not present

## 2022-07-27 DIAGNOSIS — I25119 Atherosclerotic heart disease of native coronary artery with unspecified angina pectoris: Secondary | ICD-10-CM | POA: Diagnosis not present

## 2022-07-27 DIAGNOSIS — Z809 Family history of malignant neoplasm, unspecified: Secondary | ICD-10-CM | POA: Diagnosis not present

## 2022-07-27 DIAGNOSIS — K5090 Crohn's disease, unspecified, without complications: Secondary | ICD-10-CM | POA: Diagnosis not present

## 2022-07-27 DIAGNOSIS — I10 Essential (primary) hypertension: Secondary | ICD-10-CM | POA: Diagnosis not present

## 2022-07-27 DIAGNOSIS — K219 Gastro-esophageal reflux disease without esophagitis: Secondary | ICD-10-CM | POA: Diagnosis not present

## 2022-07-27 DIAGNOSIS — J301 Allergic rhinitis due to pollen: Secondary | ICD-10-CM | POA: Diagnosis not present

## 2022-07-27 DIAGNOSIS — H409 Unspecified glaucoma: Secondary | ICD-10-CM | POA: Diagnosis not present

## 2022-07-27 DIAGNOSIS — Z8249 Family history of ischemic heart disease and other diseases of the circulatory system: Secondary | ICD-10-CM | POA: Diagnosis not present

## 2022-08-26 ENCOUNTER — Other Ambulatory Visit: Payer: Self-pay | Admitting: Family

## 2022-08-26 DIAGNOSIS — I10 Essential (primary) hypertension: Secondary | ICD-10-CM

## 2022-09-02 DIAGNOSIS — H2511 Age-related nuclear cataract, right eye: Secondary | ICD-10-CM | POA: Diagnosis not present

## 2022-09-02 DIAGNOSIS — H26492 Other secondary cataract, left eye: Secondary | ICD-10-CM | POA: Diagnosis not present

## 2022-09-02 DIAGNOSIS — H353132 Nonexudative age-related macular degeneration, bilateral, intermediate dry stage: Secondary | ICD-10-CM | POA: Diagnosis not present

## 2022-09-02 DIAGNOSIS — H401132 Primary open-angle glaucoma, bilateral, moderate stage: Secondary | ICD-10-CM | POA: Diagnosis not present

## 2022-09-16 DIAGNOSIS — R600 Localized edema: Secondary | ICD-10-CM | POA: Diagnosis not present

## 2022-09-16 DIAGNOSIS — N1832 Chronic kidney disease, stage 3b: Secondary | ICD-10-CM | POA: Diagnosis not present

## 2022-09-16 DIAGNOSIS — I129 Hypertensive chronic kidney disease with stage 1 through stage 4 chronic kidney disease, or unspecified chronic kidney disease: Secondary | ICD-10-CM | POA: Diagnosis not present

## 2022-09-16 DIAGNOSIS — N281 Cyst of kidney, acquired: Secondary | ICD-10-CM | POA: Diagnosis not present

## 2022-09-23 DIAGNOSIS — N1832 Chronic kidney disease, stage 3b: Secondary | ICD-10-CM | POA: Diagnosis not present

## 2022-09-23 DIAGNOSIS — R600 Localized edema: Secondary | ICD-10-CM | POA: Diagnosis not present

## 2022-09-23 DIAGNOSIS — I10 Essential (primary) hypertension: Secondary | ICD-10-CM | POA: Diagnosis not present

## 2022-09-23 DIAGNOSIS — I129 Hypertensive chronic kidney disease with stage 1 through stage 4 chronic kidney disease, or unspecified chronic kidney disease: Secondary | ICD-10-CM | POA: Diagnosis not present

## 2022-09-23 DIAGNOSIS — N281 Cyst of kidney, acquired: Secondary | ICD-10-CM | POA: Diagnosis not present

## 2022-09-23 DIAGNOSIS — R809 Proteinuria, unspecified: Secondary | ICD-10-CM | POA: Diagnosis not present

## 2022-10-06 ENCOUNTER — Ambulatory Visit: Payer: Medicare PPO | Admitting: Family

## 2022-11-05 ENCOUNTER — Encounter: Payer: Self-pay | Admitting: Family

## 2022-11-05 ENCOUNTER — Ambulatory Visit: Payer: Medicare PPO | Admitting: Family

## 2022-11-05 VITALS — BP 122/66 | HR 60 | Temp 98.3°F | Ht 67.0 in | Wt 153.0 lb

## 2022-11-05 DIAGNOSIS — Z23 Encounter for immunization: Secondary | ICD-10-CM | POA: Diagnosis not present

## 2022-11-05 DIAGNOSIS — R710 Precipitous drop in hematocrit: Secondary | ICD-10-CM

## 2022-11-05 DIAGNOSIS — R7303 Prediabetes: Secondary | ICD-10-CM

## 2022-11-05 DIAGNOSIS — I10 Essential (primary) hypertension: Secondary | ICD-10-CM

## 2022-11-05 DIAGNOSIS — J309 Allergic rhinitis, unspecified: Secondary | ICD-10-CM | POA: Diagnosis not present

## 2022-11-05 DIAGNOSIS — E785 Hyperlipidemia, unspecified: Secondary | ICD-10-CM

## 2022-11-05 DIAGNOSIS — Z1231 Encounter for screening mammogram for malignant neoplasm of breast: Secondary | ICD-10-CM | POA: Diagnosis not present

## 2022-11-05 MED ORDER — EZETIMIBE 10 MG PO TABS: 10.0000 mg | ORAL_TABLET | Freq: Every day | ORAL | 3 refills | Status: DC

## 2022-11-05 MED ORDER — FLUTICASONE PROPIONATE 50 MCG/ACT NA SUSP: 2.0000 | Freq: Every day | NASAL | 6 refills | Status: AC

## 2022-11-05 NOTE — Assessment & Plan Note (Signed)
Chronic, stable.  Continue amlodipine 5 mg every morning, hydralazine 10 mg 3 times daily in the morning,lunch and dinner, losartan 100 mg every night,   toprol 100 mg  lunch time, and hctz 12.5mg  QD.  She can take an additional dose of hydrochlorothiazide 12.5 mg for blood pressure persistently greater than 140/80 for maximum dose of 25 mg/day.

## 2022-11-05 NOTE — Addendum Note (Signed)
Addended by: Kristie Cowman on: 11/05/2022 02:12 PM   Modules accepted: Orders

## 2022-11-05 NOTE — Assessment & Plan Note (Addendum)
  Pending updating lipid panel.  Continue Zetia 10mg  every day.

## 2022-11-05 NOTE — Patient Instructions (Signed)
Please call  and schedule your 3D mammogram and /or bone density scan as we discussed. ? ? [facility]  ( new location in 2023) ? ?[address], [place], [place] [address] ? ?[place], [place]  ?[phone] ? ? ?

## 2022-11-05 NOTE — Progress Notes (Signed)
Assessment & Plan:  Essential hypertension Assessment & Plan: Chronic, stable.  Continue amlodipine 5 mg every morning, hydralazine 10 mg 3 times daily in the morning,lunch and dinner, losartan 100 mg every night,   toprol 100 mg  lunch time, and hctz 12.5mg  QD.  She can take an additional dose of hydrochlorothiazide 12.5 mg for blood pressure persistently greater than 140/80 for maximum dose of 25 mg/day.   Orders: -     Comprehensive metabolic panel; Future  Hyperlipidemia, unspecified hyperlipidemia type Assessment & Plan:  Pending updating lipid panel.  Continue Zetia 10mg  every day.    Orders: -     Lipid panel; Future -     Comprehensive metabolic panel; Future -     Ezetimibe; Take 1 tablet (10 mg total) by mouth daily.  Dispense: 90 tablet; Refill: 3  Prediabetes -     Hemoglobin A1c; Future  Decreased hemoglobin  Allergic rhinitis, unspecified seasonality, unspecified trigger -     Fluticasone Propionate; Place 2 sprays into both nostrils daily.  Dispense: 16 g; Refill: 6  Encounter for screening mammogram for malignant neoplasm of breast -     3D Screening Mammogram, Left and Right; Future     Return precautions given.   Risks, benefits, and alternatives of the medications and treatment plan prescribed today were discussed, and patient expressed understanding.   Education regarding symptom management and diagnosis given to patient on AVS either electronically or printed.  Return in about 6 months (around 05/05/2023) for Fasting labs in 2-3 weeks.  Rennie Plowman, FNP  Subjective:    [name]: Mia Perez, female    DOB: 10/11/1933, 89 y.o.   MRN: [id number]  CC: Mia Perez is a 89 y.o. female who presents today for follow up.   HPI: Feels well today.  No new complaints.  She feels blood pressure medications are working well.  She has not had to take an additional dose of hydrochlorothiazide.  She continues to monitor blood pressure at  home  Follow-up Dr. Thedore Mins 09/23/2022 without changes to regimen Allergies: Pravachol [pravastatin] and Penicillins Current Outpatient Medications on File Prior to Visit  Medication Sig Dispense Refill   amLODipine (NORVASC) 5 MG tablet Take 1 tablet (5 mg total) by mouth daily. 90 tablet 3   aspirin 325 MG tablet Take 325 mg by mouth daily.     budesonide (ENTOCORT EC) 3 MG 24 hr capsule TAKE 3 CAPSULES (9 MG TOTAL) BY MOUTH DAILY. (Patient taking differently: Take 3 mg by mouth every other day.) 270 capsule 11   cholecalciferol (VITAMIN D) 25 MCG (1000 UNIT) tablet Take 1,000 Units by mouth daily.     Cyanocobalamin (VITAMIN B-12) 5000 MCG TBDP Take 5,000 mcg by mouth every other day.      dorzolamide (TRUSOPT) 2 % ophthalmic solution 1 drop 2 (two) times daily.     dorzolamidel-timolol (COSOPT) 22.3-6.8 MG/ML SOLN ophthalmic solution Place 1 drop into both eyes 2 (two) times daily. Take one drop in both eyes in the morning & evening before bed.     hydrALAZINE (APRESOLINE) 10 MG tablet Take 1 tablet (10 mg total) by mouth 3 (three) times daily. 270 tablet 3   hydrochlorothiazide (MICROZIDE) 12.5 MG capsule TAKE 1 CAPSULE (12.5 MG TOTAL) BY MOUTH DAILY AS NEEDED. TAKE FOR BLOOD PRESSURE > 145/80 90 capsule 1   losartan (COZAAR) 100 MG tablet TAKE 1 TABLET BY MOUTH EVERY DAY IN THE EVENING 90 tablet 1   LUMIGAN 0.01 %  SOLN Place 1 drop into both eyes at bedtime.      metoprolol succinate (TOPROL-XL) 100 MG 24 hr tablet Take with or immediately following a meal. 90 tablet 3   Multiple Vitamins-Minerals (PRESERVISION AREDS 2 PO) Take 1 tablet by mouth 2 (two) times daily.      simethicone (MYLICON) 125 MG chewable tablet Chew 125 mg by mouth every morning.     No current facility-administered medications on file prior to visit.    Review of Systems  Constitutional:  Negative for chills and fever.  Respiratory:  Negative for cough.   Cardiovascular:  Negative for chest pain and palpitations.   Gastrointestinal:  Negative for nausea and vomiting.      Objective:    BP 122/66   Pulse 60   Temp 98.3 F (36.8 C) (Oral)   Ht 5\' 7"  (1.702 m)   Wt 153 lb (69.4 kg)   SpO2 94%   BMI 23.96 kg/m  BP Readings from Last 3 Encounters:  11/05/22 122/66  06/29/22 132/84  06/17/22 (!) 152/81   Wt Readings from Last 3 Encounters:  11/05/22 153 lb (69.4 kg)  06/29/22 152 lb 12.8 oz (69.3 kg)  06/17/22 152 lb 6 oz (69.1 kg)    Physical Exam Vitals reviewed.  Constitutional:      Appearance: She is well-developed.  Eyes:     Conjunctiva/sclera: Conjunctivae normal.  Cardiovascular:     Rate and Rhythm: Normal rate and regular rhythm.     Pulses: Normal pulses.     Heart sounds: Normal heart sounds.  Pulmonary:     Effort: Pulmonary effort is normal.     Breath sounds: Normal breath sounds. No wheezing, rhonchi or rales.  Skin:    General: Skin is warm and dry.  Neurological:     Mental Status: She is alert.  Psychiatric:        Speech: Speech normal.        Behavior: Behavior normal.        Thought Content: Thought content normal.

## 2022-11-19 ENCOUNTER — Other Ambulatory Visit: Payer: Medicare PPO

## 2022-11-19 DIAGNOSIS — E785 Hyperlipidemia, unspecified: Secondary | ICD-10-CM

## 2022-11-19 DIAGNOSIS — R7303 Prediabetes: Secondary | ICD-10-CM

## 2022-11-19 DIAGNOSIS — I10 Essential (primary) hypertension: Secondary | ICD-10-CM | POA: Diagnosis not present

## 2022-11-19 LAB — COMPREHENSIVE METABOLIC PANEL
ALT: 10 U/L (ref 0–35)
AST: 11 U/L (ref 0–37)
Albumin: 3.7 g/dL (ref 3.5–5.2)
Alkaline Phosphatase: 69 U/L (ref 39–117)
BUN: 40 mg/dL — ABNORMAL HIGH (ref 6–23)
CO2: 25 meq/L (ref 19–32)
Calcium: 9.2 mg/dL (ref 8.4–10.5)
Chloride: 109 meq/L (ref 96–112)
Creatinine, Ser: 1.39 mg/dL — ABNORMAL HIGH (ref 0.40–1.20)
GFR: 34.29 mL/min — ABNORMAL LOW (ref >60.00)
Glucose, Bld: 89 mg/dL (ref 70–99)
Potassium: 3.8 meq/L (ref 3.5–5.1)
Sodium: 141 meq/L (ref 135–145)
Total Bilirubin: 0.4 mg/dL (ref 0.2–1.2)
Total Protein: 6.4 g/dL (ref 6.0–8.3)

## 2022-11-19 LAB — LIPID PANEL
Cholesterol: 167 mg/dL (ref 0–200)
HDL: 41.3 mg/dL (ref >39.00)
LDL Cholesterol: 94 mg/dL (ref 0–99)
NonHDL: 126.03
Total CHOL/HDL Ratio: 4
Triglycerides: 161 mg/dL — ABNORMAL HIGH (ref 0.0–149.0)
VLDL: 32.2 mg/dL (ref 0.0–40.0)

## 2022-11-19 LAB — HEMOGLOBIN A1C: Hgb A1c MFr Bld: 6.4 % (ref 4.6–6.5)

## 2022-11-23 ENCOUNTER — Other Ambulatory Visit: Payer: Self-pay | Admitting: Family

## 2022-11-23 ENCOUNTER — Telehealth: Payer: Self-pay | Admitting: Family

## 2022-11-23 DIAGNOSIS — Z87898 Personal history of other specified conditions: Secondary | ICD-10-CM

## 2022-11-23 NOTE — Telephone Encounter (Signed)
Pt called stating her BP is 171/76 and she is a little dizzy. Sent to access nurse

## 2022-11-25 ENCOUNTER — Encounter: Payer: Self-pay | Admitting: Family

## 2022-11-25 ENCOUNTER — Ambulatory Visit: Payer: Medicare PPO | Admitting: Family

## 2022-11-25 VITALS — BP 138/78 | HR 63 | Temp 97.5°F | Ht 67.0 in | Wt 150.4 lb

## 2022-11-25 DIAGNOSIS — I10 Essential (primary) hypertension: Secondary | ICD-10-CM

## 2022-11-25 MED ORDER — AMLODIPINE BESYLATE 2.5 MG PO TABS
2.5000 mg | ORAL_TABLET | Freq: Every day | ORAL | 0 refills | Status: DC
Start: 2022-11-25 — End: 2023-02-15

## 2022-11-25 NOTE — Progress Notes (Unsigned)
Assessment & Plan:  There are no diagnoses linked to this encounter.   Return precautions given.   Risks, benefits, and alternatives of the medications and treatment plan prescribed today were discussed, and patient expressed understanding.   Education regarding symptom management and diagnosis given to patient on AVS either electronically or printed.  No follow-ups on file.  Rennie Plowman, FNP  Subjective:    [name]: Mia Perez, female    DOB: [birth year], 89 y.o.   MRN: [id number]  CC: Mia Perez is a 89 y.o. female who presents today for an acute visit.    HPI: She feels well today  Here today to discuss escalations in blood pressure over the last 3 days.   This morning 141/69. Blood pressure has improved over the last 2 days with 154/66, 165/76, 155/71, 124/63    Compliant with amlodipine 5 mg every morning, hydralazine 10 mg 3 times daily in the morning,lunch and dinner, losartan 100 mg every night,   toprol 100 mg  lunch time, and hctz 12.5mg  QD.   She has yet to take hydrochlorothiazide 12.5mg  , losartan 100mg  or 3rd dose hydralazine yet today    She took two additional dose of hydrochlorothiazide 12.5 mg 3 and 2 days ago, and hasn't used since.   171/76 3 days ago at 4:30 pm. , by midnight 117/59.   Denies CP, left arm numbness, HA.      Allergies: Pravachol [pravastatin] and Penicillins Current Outpatient Medications on File Prior to Visit  Medication Sig Dispense Refill   amLODipine (NORVASC) 5 MG tablet Take 1 tablet (5 mg total) by mouth daily. 90 tablet 3   aspirin 325 MG tablet Take 325 mg by mouth daily.     budesonide (ENTOCORT EC) 3 MG 24 hr capsule TAKE 3 CAPSULES (9 MG TOTAL) BY MOUTH DAILY. (Patient taking differently: Take 3 mg by mouth every other day.) 270 capsule 11   cholecalciferol (VITAMIN D) 25 MCG (1000 UNIT) tablet Take 1,000 Units by mouth daily.     Cyanocobalamin (VITAMIN B-12) 5000 MCG TBDP Take 5,000 mcg by mouth every  other day.      dorzolamide (TRUSOPT) 2 % ophthalmic solution 1 drop 2 (two) times daily.     dorzolamidel-timolol (COSOPT) 22.3-6.8 MG/ML SOLN ophthalmic solution Place 1 drop into both eyes 2 (two) times daily. Take one drop in both eyes in the morning & evening before bed.     ezetimibe (ZETIA) 10 MG tablet Take 1 tablet (10 mg total) by mouth daily. 90 tablet 3   fluticasone (FLONASE) 50 MCG/ACT nasal spray Place 2 sprays into both nostrils daily. 16 g 6   hydrALAZINE (APRESOLINE) 10 MG tablet Take 1 tablet (10 mg total) by mouth 3 (three) times daily. 270 tablet 3   hydrochlorothiazide (MICROZIDE) 12.5 MG capsule TAKE 1 CAPSULE (12.5 MG TOTAL) BY MOUTH DAILY AS NEEDED. TAKE FOR BLOOD PRESSURE > 145/80 90 capsule 1   losartan (COZAAR) 100 MG tablet TAKE 1 TABLET BY MOUTH EVERY DAY IN THE EVENING 90 tablet 1   LUMIGAN 0.01 % SOLN Place 1 drop into both eyes at bedtime.      metoprolol succinate (TOPROL-XL) 100 MG 24 hr tablet Take with or immediately following a meal. 90 tablet 3   Multiple Vitamins-Minerals (PRESERVISION AREDS 2 PO) Take 1 tablet by mouth 2 (two) times daily.      simethicone (MYLICON) 125 MG chewable tablet Chew 125 mg by mouth every morning.  No current facility-administered medications on file prior to visit.    Review of Systems  Constitutional:  Negative for chills and fever.  Eyes:  Negative for visual disturbance.  Respiratory:  Negative for cough and shortness of breath.   Cardiovascular:  Negative for chest pain, palpitations and leg swelling.  Gastrointestinal:  Negative for nausea and vomiting.  Neurological:  Negative for headaches.      Objective:    BP 138/78   Pulse 63   Temp (!) 97.5 F (36.4 C) (Oral)   Ht 5\' 7"  (1.702 m)   Wt 150 lb 6.4 oz (68.2 kg)   SpO2 98%   BMI 23.56 kg/m   BP Readings from Last 3 Encounters:  11/25/22 138/78  11/05/22 122/66  06/29/22 132/84   Wt Readings from Last 3 Encounters:  11/25/22 150 lb 6.4 oz (68.2  kg)  11/05/22 153 lb (69.4 kg)  06/29/22 152 lb 12.8 oz (69.3 kg)    Physical Exam Vitals reviewed.  Constitutional:      Appearance: She is well-developed.  Eyes:     Conjunctiva/sclera: Conjunctivae normal.  Cardiovascular:     Rate and Rhythm: Normal rate and regular rhythm.     Pulses: Normal pulses.     Heart sounds: Normal heart sounds.  Pulmonary:     Effort: Pulmonary effort is normal.     Breath sounds: Normal breath sounds. No wheezing, rhonchi or rales.  Skin:    General: Skin is warm and dry.  Neurological:     Mental Status: She is alert.  Psychiatric:        Speech: Speech normal.        Behavior: Behavior normal.        Thought Content: Thought content normal.

## 2022-11-25 NOTE — Patient Instructions (Signed)
As discussed, you may take hydrochlorothiazide 12.5 mg additional if blood pressures greater than 140/90.  If AFTER 2 HOURS, if blood pressure has not starting to respond, then you make take additional amlodipine 2.5mg  .  If blood pressure is responding, I would give hydrochlorothiazide more time.  Certainly if you experience chest pain, left arm numbness or jaw numbness, shortness of breath which coincide with elevated blood pressure, please call 911 and report to nearest emergency room

## 2022-11-25 NOTE — Telephone Encounter (Signed)
Fyi : PT is scheduled to see you at 12:30 today.

## 2022-11-26 NOTE — Assessment & Plan Note (Addendum)
Labile blood pressure.  Improved in the office today.  Patient understandably concerned about blood pressure escalations at home.  She wanted to have a plan in regards blood pressure and if would escalate again despite taking additional dose of hydrochlorothiazide 12.5 mg daily.  I advised her that she may take an additional dose of amlodipine 2.5 mg if needed for blood pressure greater than 140/80.  I advised her she must wait at least 2 hours from taking hydrochlorothiazide 12.5mg  additional dose to give medication time to take effect.   Continue amlodipine 5 mg every morning, hydralazine 10 mg 3 times daily in the morning,lunch and dinner, losartan 100 mg every night,   toprol 100 mg  lunch time, and hctz 12.5mg  QD.    Alternatively, I considered increasing hydrochlorothiazide 25 mg daily versus using additional 12.5mg  dose as PRN however blood pressure has had low end normal readings ( such as 117/59) so I am hesitant to increase indefinitely. Close follow up.

## 2022-12-09 DIAGNOSIS — Z86006 Personal history of melanoma in-situ: Secondary | ICD-10-CM | POA: Diagnosis not present

## 2022-12-09 DIAGNOSIS — L858 Other specified epidermal thickening: Secondary | ICD-10-CM | POA: Diagnosis not present

## 2022-12-09 DIAGNOSIS — Z85820 Personal history of malignant melanoma of skin: Secondary | ICD-10-CM | POA: Diagnosis not present

## 2022-12-09 DIAGNOSIS — Z08 Encounter for follow-up examination after completed treatment for malignant neoplasm: Secondary | ICD-10-CM | POA: Diagnosis not present

## 2022-12-09 DIAGNOSIS — L821 Other seborrheic keratosis: Secondary | ICD-10-CM | POA: Diagnosis not present

## 2022-12-09 DIAGNOSIS — Z85828 Personal history of other malignant neoplasm of skin: Secondary | ICD-10-CM | POA: Diagnosis not present

## 2022-12-09 DIAGNOSIS — L570 Actinic keratosis: Secondary | ICD-10-CM | POA: Diagnosis not present

## 2022-12-30 ENCOUNTER — Ambulatory Visit: Payer: Medicare PPO | Admitting: Dietician

## 2023-01-20 ENCOUNTER — Encounter: Payer: Self-pay | Admitting: Dietician

## 2023-01-20 ENCOUNTER — Encounter: Payer: Medicare PPO | Attending: Family | Admitting: Dietician

## 2023-01-20 VITALS — Ht 67.0 in | Wt 148.6 lb

## 2023-01-20 DIAGNOSIS — E785 Hyperlipidemia, unspecified: Secondary | ICD-10-CM | POA: Insufficient documentation

## 2023-01-20 DIAGNOSIS — Z713 Dietary counseling and surveillance: Secondary | ICD-10-CM | POA: Diagnosis not present

## 2023-01-20 DIAGNOSIS — N183 Chronic kidney disease, stage 3 unspecified: Secondary | ICD-10-CM | POA: Insufficient documentation

## 2023-01-20 DIAGNOSIS — Z833 Family history of diabetes mellitus: Secondary | ICD-10-CM | POA: Insufficient documentation

## 2023-01-20 DIAGNOSIS — R7303 Prediabetes: Secondary | ICD-10-CM | POA: Insufficient documentation

## 2023-01-20 DIAGNOSIS — I129 Hypertensive chronic kidney disease with stage 1 through stage 4 chronic kidney disease, or unspecified chronic kidney disease: Secondary | ICD-10-CM | POA: Diagnosis not present

## 2023-01-20 DIAGNOSIS — I10 Essential (primary) hypertension: Secondary | ICD-10-CM

## 2023-01-20 NOTE — Patient Instructions (Addendum)
Eat a meal or snack every 3-5 hours during the day to keep blood sugar Keep milk to 1-2 glasses each day, low fat or fat free Use only small amounts of gravies and sauces on foods, especially restaurant meals. Eat mostly baked, grilled, lightly sauteed foods and limit breaded and deep fried. Breading on food is like eating a piece of bread with extra butter/ oil and high in sodium Try [employer]'s Own "100% whole wheat bread with honey"  Try 1/2-sweet tea when eating out, or even better unsweetened tea with sweetener such as stevia (green packets) or splenda (yellow packets). Also try using 1/2 sugar in other foods when cooking (like sweet potatoes, oatmeal, etc) Choose low sodium canned veggies and if needed add Mrs [name] seasoning (original or table blend) Include some light activity for 10 minutes or more a few times a day to help keep blood sugar in control, keep blood pressure healthy, and help circulation.

## 2023-01-20 NOTE — Progress Notes (Signed)
Medical Nutrition Therapy: Visit start time: 0900  end time: 1000  Assessment:   Referral Diagnosis: prediabetes Other medical history/ diagnoses: HTN, HLD, CKD stage 3 sees Dr Thedore Mins every 6 months Psychosocial issues/ stress concerns: none  Medications, supplements: reconciled list in medical record   Current weight: 148.6lbs Height: 5'7" BMI: 23.27  Progress and evaluation:  Reports she does not eat large amounts of meats, prefers vegetables. Eats some fried veg. Has been in prediabetic range for several years. Has strong family history of Type 2 diabetes. Came for MNT visits (2) in 2022, still has most written resources provided at those visits. Recent labs (11/19/22) HbA1C 6.4% total cholesterol  167, HDL 41, LDL 94, Triglycerides 161; renal labs (09/2022) GFR 34, BUN 43, creatinine 1.5, sodium 134, potassium 3.5, phosphorus 3.5, calcium 8.6  Doesn't eat much fish but does like fried shrimp or flounder; salmon patties; used to eat tuna salad but not in many years Food allergies: none Special diet practices: none Patient seeks help with reducing risk for Type 2 DM. Next PCP appt is 02/2023   Dietary Intake:  Usual eating pattern includes 2 meals and 0-1 snacks per day. Dining out frequency: 1-2 meals per week. Who plans meals/ buys groceries? self Who prepares meals? self  Breakfast: biscuit with jelly + cup of milk; corn flakes with sm amt added sugar, sm amt milk; oatmeal during cold weather, made with evaporated milk; eggs often on weekends, + milk Snack: none Lunch/ supper combo meal: beans/ sweet potatoes + cooked cabbage or coleslaw/ salad/ canned greens/ yellow squash with onion or fried/ fried okra; sometimes BBQ/ occ fried shrimp; BLT / chicken salad/ peanut butter sandwich + milk out with friends of Fridays -- spaghetti/ lasagna (eats 1/2, freezes leftovers)/ chopped steak with onion/ msuhroom gravy; [place] chicken skewer with tsaziki sauce; cheese dog (no hot dog just  cheese, chili, condiments; pizza/ [place] Snack: sometimes carnation breakfast drink about 7pm  Beverages: water throughout day (48oz+); sm cup coffee with sweet n low and creamer in am; milk skim- 2%; occ diet coke; sweet tea with lemon when dining out (1x weekly)  Physical activity: ADLs, takes stairs at home    Intervention:   Nutrition Care Education:   Basic nutrition: basic food groups; appropriate nutrient balance; appropriate meal and snack schedule; general nutrition guidelines    Weight control: identifying healthy weight range per patient question Advanced nutrition:  dining out preDiabetes:  appropriate meal and snack schedule; appropriate carb intake and balance, healthy carb choices for lower glycemic response; role of fiber, protein; effects of physical activity on BGs and suitable options Hypertension, CKD:  identifying high sodium foods, options for seasoning foods Hyperlipidemia: healthy and unhealthy fats; role of fiber   Other intervention notes: Patient seems to be consuming some higher carb meals and some high sodium foods; she voices readiness to make some changes with her goal of preventing diabetes.  Established goals for change with direction from patient.  No follow up scheduled at this time; patient will schedule if needed after next PCP appt.   Nutritional Diagnosis:  [place]-2.1 Inpaired nutrition utilization and [place]-2.2 Altered nutrition-related laboratory As related to prediabetes, CKD, hyperlipidemia, HTN.  As evidenced by BP and cholesterol controlled with medications, elevated HbA1C, low GFR.   Education Materials given:  Designer, industrial/product with food lists, sample meal pattern Visit summary with goals/ instructions   Learner/ who was taught:  Patient   Level of understanding: Verbalizes/ demonstrates competency  Demonstrated degree of  understanding via:   Teach back Learning barriers: None  Willingness to learn/ readiness for change: Acceptance,  ready for change   Monitoring and Evaluation:  Dietary intake, exercise, BG control, renal function, and body weight      follow up: prn

## 2023-02-15 ENCOUNTER — Other Ambulatory Visit: Payer: Self-pay | Admitting: Family

## 2023-02-15 DIAGNOSIS — I10 Essential (primary) hypertension: Secondary | ICD-10-CM

## 2023-02-24 ENCOUNTER — Ambulatory Visit: Payer: Medicare PPO | Admitting: Family

## 2023-02-24 ENCOUNTER — Encounter: Payer: Self-pay | Admitting: Family

## 2023-02-24 DIAGNOSIS — I10 Essential (primary) hypertension: Secondary | ICD-10-CM

## 2023-02-24 LAB — POCT GLYCOSYLATED HEMOGLOBIN (HGB A1C): Hemoglobin A1C: 5.8 % — AB (ref 4.0–5.6)

## 2023-02-24 MED ORDER — AMLODIPINE BESYLATE 2.5 MG PO TABS: ORAL_TABLET | ORAL | 0 refills | Status: DC

## 2023-02-24 MED ORDER — LOSARTAN POTASSIUM 100 MG PO TABS: ORAL_TABLET | ORAL | 1 refills | Status: DC

## 2023-02-24 MED ORDER — HYDROCHLOROTHIAZIDE 12.5 MG PO CAPS: 12.5000 mg | ORAL_CAPSULE | Freq: Every day | ORAL | 1 refills | Status: DC | PRN

## 2023-02-24 NOTE — Assessment & Plan Note (Signed)
 Blood pressure stable in the office today.  History of labile blood pressure.   Continue amlodipine  5 mg every morning, hydralazine  10 mg 3 times daily in the morning,lunch and dinner, losartan  100 mg every night,   toprol  100 mg  lunch time, and hctz 12.5mg  QD.    Continue additional dose of amlodipine  2.5 mg if needed for blood pressure greater than 140/80.

## 2023-02-24 NOTE — Progress Notes (Signed)
 Assessment & Plan:  Essential hypertension Assessment & Plan: Blood pressure stable in the office today.  History of labile blood pressure.   Continue amlodipine  5 mg every morning, hydralazine  10 mg 3 times daily in the morning,lunch and dinner, losartan  100 mg every night,   toprol  100 mg  lunch time, and hctz 12.5mg  QD.    Continue additional dose of amlodipine  2.5 mg if needed for blood pressure greater than 140/80.   Orders: -     amLODIPine  Besylate; TAKE 1 TABLET BY MOUTH DAILY IF BP IS >140/90 2.5 MG  Dispense: 90 tablet; Refill: 0 -     hydroCHLOROthiazide ; Take 1 capsule (12.5 mg total) by mouth daily as needed. Take for blood pressure > 145/80  Dispense: 90 capsule; Refill: 1 -     Losartan  Potassium; TAKE 1 TABLET BY MOUTH EVERY DAY IN THE EVENING (100 MG)  Dispense: 90 tablet; Refill: 1 -     POCT glycosylated hemoglobin (Hb A1C)     Return precautions given.   Risks, benefits, and alternatives of the medications and treatment plan prescribed today were discussed, and patient expressed understanding.   Education regarding symptom management and diagnosis given to patient on AVS either electronically or printed.  No follow-ups on file.  Mia Bossier, FNP  Subjective:    [name]: Mia Perez, female    DOB: September 10, 1933, 89 y.o.   MRN: [id number]  CC: Mia Perez is a 89 y.o. female who presents today for follow up.   HPI: Feels well today.  No new complaints. She is overall pleased with blood pressure regimen.  Denies chest pain, shortness of breath.    Allergies: Pravachol  [pravastatin ] and Penicillins Current Outpatient Medications on File Prior to Visit  Medication Sig Dispense Refill   amLODipine  (NORVASC ) 5 MG tablet Take 1 tablet (5 mg total) by mouth daily. 90 tablet 3   aspirin 325 MG tablet Take 325 mg by mouth daily.     budesonide  (ENTOCORT EC ) 3 MG 24 hr capsule TAKE 3 CAPSULES (9 MG TOTAL) BY MOUTH DAILY. (Patient taking  differently: Take 3 mg by mouth every other day.) 270 capsule 11   cholecalciferol (VITAMIN D ) 25 MCG (1000 UNIT) tablet Take 1,000 Units by mouth daily.     Cyanocobalamin (VITAMIN B-12) 5000 MCG TBDP Take 500 mcg by mouth daily.     dorzolamide (TRUSOPT) 2 % ophthalmic solution 1 drop 2 (two) times daily.     dorzolamidel-timolol (COSOPT) 22.3-6.8 MG/ML SOLN ophthalmic solution Place 1 drop into both eyes 2 (two) times daily. Take one drop in both eyes in the morning & evening before bed.     ezetimibe  (ZETIA ) 10 MG tablet Take 1 tablet (10 mg total) by mouth daily. 90 tablet 3   fluticasone  (FLONASE ) 50 MCG/ACT nasal spray Place 2 sprays into both nostrils daily. 16 g 6   hydrALAZINE  (APRESOLINE ) 10 MG tablet Take 1 tablet (10 mg total) by mouth 3 (three) times daily. 270 tablet 3   LUMIGAN 0.01 % SOLN Place 1 drop into both eyes at bedtime.      metoprolol  succinate (TOPROL -XL) 100 MG 24 hr tablet Take with or immediately following a meal. 90 tablet 3   Multiple Vitamins-Minerals (PRESERVISION AREDS 2 PO) Take 1 tablet by mouth 2 (two) times daily.      simethicone (MYLICON) 125 MG chewable tablet Chew 125 mg by mouth every morning.     No current facility-administered medications on file prior to  visit.    Review of Systems  Constitutional:  Negative for chills and fever.  Respiratory:  Negative for cough.   Cardiovascular:  Negative for chest pain and palpitations.  Gastrointestinal:  Negative for nausea and vomiting.      Objective:    BP 132/74   Pulse 65   Temp (!) 97.4 F (36.3 C) (Oral)   Ht 5\' 7"  (1.702 m)   Wt 144 lb 6.4 oz (65.5 kg)   SpO2 97%   BMI 22.62 kg/m  BP Readings from Last 3 Encounters:  02/24/23 132/74  11/25/22 138/78  11/05/22 122/66   Wt Readings from Last 3 Encounters:  02/24/23 144 lb 6.4 oz (65.5 kg)  01/20/23 148 lb 9.6 oz (67.4 kg)  11/25/22 150 lb 6.4 oz (68.2 kg)    Physical Exam Vitals reviewed.  Constitutional:      Appearance: She  is well-developed.  Eyes:     Conjunctiva/sclera: Conjunctivae normal.  Cardiovascular:     Rate and Rhythm: Normal rate and regular rhythm.     Pulses: Normal pulses.     Heart sounds: Normal heart sounds.  Pulmonary:     Effort: Pulmonary effort is normal.     Breath sounds: Normal breath sounds. No wheezing, rhonchi or rales.  Skin:    General: Skin is warm and dry.  Neurological:     Mental Status: She is alert.  Psychiatric:        Speech: Speech normal.        Behavior: Behavior normal.        Thought Content: Thought content normal.

## 2023-03-02 ENCOUNTER — Ambulatory Visit
Admission: RE | Admit: 2023-03-02 | Discharge: 2023-03-02 | Disposition: A | Discharge status: Home / Self Care | Payer: Medicare PPO | Source: Ambulatory Visit | Attending: Family | Admitting: Family

## 2023-03-02 DIAGNOSIS — Z1231 Encounter for screening mammogram for malignant neoplasm of breast: Secondary | ICD-10-CM

## 2023-03-04 DIAGNOSIS — H401132 Primary open-angle glaucoma, bilateral, moderate stage: Secondary | ICD-10-CM | POA: Diagnosis not present

## 2023-03-04 DIAGNOSIS — H26492 Other secondary cataract, left eye: Secondary | ICD-10-CM | POA: Diagnosis not present

## 2023-03-04 DIAGNOSIS — H04123 Dry eye syndrome of bilateral lacrimal glands: Secondary | ICD-10-CM | POA: Diagnosis not present

## 2023-03-04 DIAGNOSIS — Z0100 Encounter for examination of eyes and vision without abnormal findings: Secondary | ICD-10-CM | POA: Diagnosis not present

## 2023-03-04 DIAGNOSIS — H2511 Age-related nuclear cataract, right eye: Secondary | ICD-10-CM | POA: Diagnosis not present

## 2023-03-04 DIAGNOSIS — H353132 Nonexudative age-related macular degeneration, bilateral, intermediate dry stage: Secondary | ICD-10-CM | POA: Diagnosis not present

## 2023-03-26 ENCOUNTER — Other Ambulatory Visit: Payer: Self-pay | Admitting: Family

## 2023-03-26 DIAGNOSIS — I10 Essential (primary) hypertension: Secondary | ICD-10-CM

## 2023-03-30 ENCOUNTER — Ambulatory Visit: Payer: Self-pay | Admitting: Family

## 2023-03-30 DIAGNOSIS — U071 COVID-19: Secondary | ICD-10-CM

## 2023-03-30 HISTORY — DX: COVID-19: U07.1

## 2023-03-30 NOTE — Telephone Encounter (Signed)
 Routing to office.   Copied From CRM [id number]. Reason for Triage: Patient tested positive for COVID after going to the Urgent care. The provider was going to prescribe the patient the following medication: molnuprazir. The patient is wanting to know if the medication would be safe for her to take and would it be possible to have a generic brand sent to the pharmacy because the cost is $1,000

## 2023-03-31 ENCOUNTER — Other Ambulatory Visit: Payer: Self-pay

## 2023-03-31 MED ORDER — NIRMATRELVIR/RITONAVIR (PAXLOVID) TABLET (RENAL DOSING)
2.0000 | ORAL_TABLET | Freq: Two times a day (BID) | ORAL | 0 refills | Status: AC
Start: 2023-03-31 — End: 2023-04-05
  Filled 2023-03-31: qty 20, 5d supply, fill #0

## 2023-03-31 MED ORDER — MOLNUPIRAVIR EUA 200MG CAPSULE
4.0000 | ORAL_CAPSULE | Freq: Two times a day (BID) | ORAL | 0 refills | Status: DC
  Filled 2023-03-31: qty 40, 5d supply, fill #0

## 2023-03-31 NOTE — Telephone Encounter (Signed)
 Pt has been notified that per test claim her copay for Paxlovid should be $0

## 2023-03-31 NOTE — Telephone Encounter (Signed)
 Clinical team I spoke with a pharmacy and there is a coupon for Molnupiravir to reduce cost.  I have sent it to [facility] pharmacy.  There is a coupon for Medicare patients.  Advise patient she will get a phone call from them and if not to call them in the hour

## 2023-03-31 NOTE — Telephone Encounter (Signed)
 Please call pharmacy.  That is an outrageous cost for [employer].   Is this a true cost?  Call pt  Please triage symptoms.When did sxs start?    If symptoms are mild, she does not need to take molnupiravir  Due to her kidney disease, I would not recommend the alternative Paxlovid.  Patient  GFR is 34.     Paxlovid  eGFR <30 mL/minute: Use is not recommended according to the manufacturer; however, the risk of toxicity is likely to be minimal with a 5-day course of treatment.

## 2023-03-31 NOTE — Addendum Note (Signed)
 Addended by: Allegra Grana on: 03/31/2023 10:47 AM   Modules accepted: Orders

## 2023-03-31 NOTE — Telephone Encounter (Signed)
 Spoke to pt and did Advise pt that while paxlovid , it may may amlodipine more potent, stronger.    If she feels blood pressure is low or becomes dizzy, she may reduce amlodipine dose by half. Pt verbalized understanding

## 2023-03-31 NOTE — Telephone Encounter (Signed)
 Spoke to pharmacy and to pt that is cost for medication insurance does not cover it, informed pt that we sent in the Paxlovid also.pt verbalized understanding

## 2023-03-31 NOTE — Telephone Encounter (Signed)
 I sent in renally dosed paxlovid  Mia Perez team  Advise pt that while paxlovid , it may may amlodipine more potent, stronger.   If she feels blood pressure is low or becomes dizzy, she may reduce amlodipine dose by half

## 2023-03-31 NOTE — Addendum Note (Signed)
 Addended by: Allegra Grana on: 03/31/2023 11:26 AM   Modules accepted: Orders

## 2023-04-06 ENCOUNTER — Telehealth: Payer: Self-pay

## 2023-04-06 NOTE — Telephone Encounter (Signed)
 Copied from CRM [id number]. Topic: Clinical - Medical Advice >> Apr 06, 2023  2:41 PM Sim Boast F wrote: Reason for CRM: Patient blood pressure 121/55 and wants to know if her blood pressure medication should be adjusted? Also mentioned not having a big appetite

## 2023-04-07 NOTE — Telephone Encounter (Signed)
 Has a BP as low as 109/50's. And wants to know when its too low. She states that she knows what to take when its too high. We went over medications & she is taking them all and instructed at last visit.  Please advise if you would like her to come in for a visit or set her up with the pharmacist.

## 2023-04-08 NOTE — Telephone Encounter (Signed)
 Noted F/u sch 04/12/23

## 2023-04-08 NOTE — Telephone Encounter (Signed)
 Spoke to pt. She stated she was sick with covid. And that she had gotten off her regular routine.advise pt to keep check of her bp and bring all of her meds in with her to her appointment. Pt denied any chest pain or dizziness but stated she is tired from being sick. Pt checked bp over the phone 159/94. Informed pt she was okay to to take her medications for her informed pt she can take her meds to check before bed time before she takes anymore

## 2023-04-08 NOTE — Telephone Encounter (Signed)
 Call patient More importantly than a particular  blood pressure reading would be patient's symptoms.  Is she feeling lightheaded, dizzy?  I would prefer her BP to be greater than 115-120/80.    Please ensure that she is not taking additional amlodipine 2.5 mg or hydrochlorothiazide 12.5mg  ; both of these are as needed medications  Please let me know exactly what she is taking at this time as likely I will go ahead and decrease the dose  Encourage plenty of water and be careful with changing position  BP Readings from Last 3 Encounters:  02/24/23 132/74  11/25/22 138/78  11/05/22 122/66    Sch f/u with me

## 2023-04-12 ENCOUNTER — Ambulatory Visit: Payer: Medicare PPO | Admitting: Family

## 2023-04-12 DIAGNOSIS — I10 Essential (primary) hypertension: Secondary | ICD-10-CM

## 2023-04-12 NOTE — Assessment & Plan Note (Addendum)
 Blood pressure stable in the office today.  History of labile blood pressure.   Continue amlodipine 5 mg every morning, hydralazine 10 mg 3 times daily in the morning,lunch and dinner, losartan 100 mg every night,   toprol 100 mg  lunch time, and hctz 12.5mg  QD.    Continue additional dose of amlodipine 2.5 mg if needed for blood pressure greater than 140/80.   Declines updating labs today to monitor renal function, electrolytes.  She prefers to have labs done with upcoming nephrology appointment.

## 2023-04-12 NOTE — Progress Notes (Unsigned)
 Assessment & Plan:  There are no diagnoses linked to this encounter.   Return precautions given.   Risks, benefits, and alternatives of the medications and treatment plan prescribed today were discussed, and patient expressed understanding.   Education regarding symptom management and diagnosis given to patient on AVS either electronically or printed.  No follow-ups on file.  Rennie Plowman, FNP  Subjective:    [name]: Mia Perez, female    DOB: Jul 01, 1933, 89 y.o.   MRN: [id number]  CC: Mia Perez is a 89 y.o. female who presents today for an acute visit.    HPI: Here today for blood pressure follow up  At home today 130/65, HR 81.    Continue amlodipine 5 mg every morning, hydralazine 10 mg 3 times daily in the morning,lunch and dinner, losartan 100 mg every night,   toprol 100 mg  lunch time, and hctz 12.5mg  QD.    She hasn't used amlodipine 2.5 mg if needed for blood pressure greater than 140/80.   Denies CP, sob, leg swelling, dizziness.     Blood pressure reading 109/50 8 days ago.   patient had COVID 10 days ago.  Treated with Paxlovid.  Advised to reduce amlodipine by half blood pressure became too low.   Cough has improved.    Allergies: Pravachol [pravastatin] and Penicillins Current Outpatient Medications on File Prior to Visit  Medication Sig Dispense Refill   amLODipine (NORVASC) 2.5 MG tablet TAKE 1 TABLET BY MOUTH DAILY IF BP IS >140/90 2.5 MG 90 tablet 0   amLODipine (NORVASC) 5 MG tablet Take 1 tablet (5 mg total) by mouth daily. 90 tablet 3   aspirin 325 MG tablet Take 325 mg by mouth daily.     budesonide (ENTOCORT EC) 3 MG 24 hr capsule TAKE 3 CAPSULES (9 MG TOTAL) BY MOUTH DAILY. (Patient taking differently: Take 3 mg by mouth every other day.) 270 capsule 11   cholecalciferol (VITAMIN D) 25 MCG (1000 UNIT) tablet Take 1,000 Units by mouth daily.     Cyanocobalamin (VITAMIN B-12) 5000 MCG TBDP Take 500 mcg by mouth daily.      dorzolamide (TRUSOPT) 2 % ophthalmic solution 1 drop 2 (two) times daily.     dorzolamidel-timolol (COSOPT) 22.3-6.8 MG/ML SOLN ophthalmic solution Place 1 drop into both eyes 2 (two) times daily. Take one drop in both eyes in the morning & evening before bed.     ezetimibe (ZETIA) 10 MG tablet Take 1 tablet (10 mg total) by mouth daily. 90 tablet 3   fluticasone (FLONASE) 50 MCG/ACT nasal spray Place 2 sprays into both nostrils daily. 16 g 6   hydrALAZINE (APRESOLINE) 10 MG tablet Take 1 tablet (10 mg total) by mouth 3 (three) times daily. 270 tablet 3   hydrochlorothiazide (MICROZIDE) 12.5 MG capsule Take 1 capsule (12.5 mg total) by mouth daily as needed. Take for blood pressure > 145/80 90 capsule 1   losartan (COZAAR) 100 MG tablet TAKE 1 TABLET BY MOUTH EVERY DAY IN THE EVENING (100 MG) 90 tablet 1   LUMIGAN 0.01 % SOLN Place 1 drop into both eyes at bedtime.      metoprolol succinate (TOPROL-XL) 100 MG 24 hr tablet TAKE 1 TABLET BY MOUTH WITH OR IMMEDIATELY FOLLOWING A MEAL. 90 tablet 3   Multiple Vitamins-Minerals (PRESERVISION AREDS 2 PO) Take 1 tablet by mouth 2 (two) times daily.      simethicone (MYLICON) 125 MG chewable tablet Chew 125 mg by mouth every  morning.     No current facility-administered medications on file prior to visit.    Review of Systems    Objective:    BP 130/72   Pulse 78   Temp 97.9 F (36.6 C) (Oral)   Ht 5\' 7"  (1.702 m)   Wt 141 lb 6.4 oz (64.1 kg)   SpO2 98%   BMI 22.15 kg/m   BP Readings from Last 3 Encounters:  04/12/23 130/72  02/24/23 132/74  11/25/22 138/78   Wt Readings from Last 3 Encounters:  04/12/23 141 lb 6.4 oz (64.1 kg)  02/24/23 144 lb 6.4 oz (65.5 kg)  01/20/23 148 lb 9.6 oz (67.4 kg)    Physical Exam

## 2023-04-16 ENCOUNTER — Telehealth: Payer: Self-pay | Admitting: Family

## 2023-04-16 ENCOUNTER — Telehealth: Payer: Self-pay

## 2023-04-16 NOTE — Telephone Encounter (Signed)
 Patient has an appointment on 05/05/23 with Arnett. She wanted to know if she needed to come in for labs before her appointment, no lab orders

## 2023-04-16 NOTE — Telephone Encounter (Signed)
 Patient states she would like for [place] to please fax her last office visit note to Magda Paganini at [facility] (fax:  [phone]).  I faxed office visit note from 04/12/2023 per patient request.

## 2023-04-19 NOTE — Telephone Encounter (Signed)
 Spoke to pt regarding message below pt verbalized understanding

## 2023-04-29 ENCOUNTER — Other Ambulatory Visit: Payer: Self-pay | Admitting: Family

## 2023-04-29 DIAGNOSIS — I10 Essential (primary) hypertension: Secondary | ICD-10-CM

## 2023-05-05 ENCOUNTER — Other Ambulatory Visit

## 2023-05-05 ENCOUNTER — Ambulatory Visit: Payer: Medicare PPO | Admitting: Family

## 2023-05-05 ENCOUNTER — Encounter: Payer: Self-pay | Admitting: Family

## 2023-05-05 ENCOUNTER — Telehealth: Payer: Self-pay

## 2023-05-05 VITALS — BP 136/72 | HR 75 | Temp 97.5°F | Ht 67.0 in | Wt 141.2 lb

## 2023-05-05 DIAGNOSIS — L0291 Cutaneous abscess, unspecified: Secondary | ICD-10-CM | POA: Diagnosis not present

## 2023-05-05 DIAGNOSIS — Z8616 Personal history of COVID-19: Secondary | ICD-10-CM

## 2023-05-05 DIAGNOSIS — I10 Essential (primary) hypertension: Secondary | ICD-10-CM | POA: Diagnosis not present

## 2023-05-05 MED ORDER — LOSARTAN POTASSIUM 100 MG PO TABS
ORAL_TABLET | ORAL | 1 refills | Status: DC
Start: 2023-05-05 — End: 2023-10-18

## 2023-05-05 MED ORDER — HYDROCHLOROTHIAZIDE 12.5 MG PO CAPS: 12.5000 mg | ORAL_CAPSULE | Freq: Every day | ORAL | 1 refills | Status: DC | PRN

## 2023-05-05 MED ORDER — AMLODIPINE BESYLATE 5 MG PO TABS
5.0000 mg | ORAL_TABLET | Freq: Every day | ORAL | 3 refills | Status: DC
Start: 2023-05-05 — End: 2023-07-14

## 2023-05-05 MED ORDER — DOXYCYCLINE HYCLATE 100 MG PO TABS
100.0000 mg | ORAL_TABLET | Freq: Two times a day (BID) | ORAL | 0 refills | Status: DC
Start: 2023-05-05 — End: 2023-05-12

## 2023-05-05 NOTE — Telephone Encounter (Signed)
 Copied from CRM [id number]. Topic: General - Other >> May 05, 2023  1:32 PM Abigail D wrote: Reason for CRM: Pt's daughter Herbie Drape received 2 missed calls while her mother was at her appt for 1pm, PT concerned and wanted to be sure everything was okay and notified to give her a call back.

## 2023-05-05 NOTE — Telephone Encounter (Signed)
 Spoke to Mia Perez and informed her that Mom appt was just fine

## 2023-05-05 NOTE — Progress Notes (Unsigned)
 Assessment & Plan:  Essential hypertension -     amLODIPine Besylate; Take 1 tablet (5 mg total) by mouth daily.  Dispense: 90 tablet; Refill: 3 -     hydroCHLOROthiazide; Take 1 capsule (12.5 mg total) by mouth daily as needed. Take for blood pressure > 145/80  Dispense: 90 capsule; Refill: 1 -     Losartan Potassium; TAKE 1 TABLET BY MOUTH EVERY DAY IN THE EVENING (100 MG)  Dispense: 90 tablet; Refill: 1     Return precautions given.   Risks, benefits, and alternatives of the medications and treatment plan prescribed today were discussed, and patient expressed understanding.   Education regarding symptom management and diagnosis given to patient on AVS either electronically or printed.  No follow-ups on file.  Rennie Plowman, FNP  Subjective:    [name]: Mia Perez, female    DOB: [birth year], 89 y.o.   MRN: [id number]  CC: Mia Perez is a 89 y.o. female who presents today for follow up.   HPI: HPI Diagnosed with COVID 03/30/2023. Allergies: Pravachol [pravastatin] and Penicillins Current Outpatient Medications on File Prior to Visit  Medication Sig Dispense Refill   amLODipine (NORVASC) 2.5 MG tablet TAKE 1 TABLET BY MOUTH DAILY IF BP IS >140/90 2.5 MG 90 tablet 0   aspirin 325 MG tablet Take 325 mg by mouth daily.     budesonide (ENTOCORT EC) 3 MG 24 hr capsule TAKE 3 CAPSULES (9 MG TOTAL) BY MOUTH DAILY. (Patient taking differently: Take 3 mg by mouth every other day.) 270 capsule 11   cholecalciferol (VITAMIN D) 25 MCG (1000 UNIT) tablet Take 1,000 Units by mouth daily.     Cyanocobalamin (VITAMIN B-12) 5000 MCG TBDP Take 500 mcg by mouth daily.     dorzolamide (TRUSOPT) 2 % ophthalmic solution 1 drop 2 (two) times daily.     dorzolamidel-timolol (COSOPT) 22.3-6.8 MG/ML SOLN ophthalmic solution Place 1 drop into both eyes 2 (two) times daily. Take one drop in both eyes in the morning & evening before bed.     ezetimibe (ZETIA) 10 MG tablet Take 1 tablet (10  mg total) by mouth daily. 90 tablet 3   fluticasone (FLONASE) 50 MCG/ACT nasal spray Place 2 sprays into both nostrils daily. 16 g 6   hydrALAZINE (APRESOLINE) 10 MG tablet TAKE 1 TABLET BY MOUTH THREE TIMES A DAY 90 tablet 0   LUMIGAN 0.01 % SOLN Place 1 drop into both eyes at bedtime.      metoprolol succinate (TOPROL-XL) 100 MG 24 hr tablet TAKE 1 TABLET BY MOUTH WITH OR IMMEDIATELY FOLLOWING A MEAL. 90 tablet 3   Multiple Vitamins-Minerals (PRESERVISION AREDS 2 PO) Take 1 tablet by mouth 2 (two) times daily.      simethicone (MYLICON) 125 MG chewable tablet Chew 125 mg by mouth every morning.     No current facility-administered medications on file prior to visit.    Review of Systems    Objective:    BP 136/72   Pulse 75   Temp (!) 97.5 F (36.4 C) (Oral)   Ht 5\' 7"  (1.702 m)   Wt 141 lb 3.2 oz (64 kg)   SpO2 97%   BMI 22.12 kg/m  BP Readings from Last 3 Encounters:  05/05/23 136/72  04/12/23 130/72  02/24/23 132/74   Wt Readings from Last 3 Encounters:  05/05/23 141 lb 3.2 oz (64 kg)  04/12/23 141 lb 6.4 oz (64.1 kg)  02/24/23 144 lb 6.4 oz (65.5 kg)  Physical Exam Vitals reviewed.  Constitutional:      Appearance: She is well-developed.  Eyes:     Conjunctiva/sclera: Conjunctivae normal.  Cardiovascular:     Rate and Rhythm: Normal rate and regular rhythm.     Pulses: Normal pulses.     Heart sounds: Normal heart sounds.  Pulmonary:     Effort: Pulmonary effort is normal.     Breath sounds: Normal breath sounds. No wheezing, rhonchi or rales.  Skin:    General: Skin is warm and dry.  Neurological:     Mental Status: She is alert.  Psychiatric:        Speech: Speech normal.        Behavior: Behavior normal.        Thought Content: Thought content normal.

## 2023-05-05 NOTE — Patient Instructions (Signed)
 Start doxycycline, antibiotic Take with food Ensure to take probiotics while on antibiotics and also for 2 weeks after completion. This can either be by eating yogurt daily or taking a probiotic supplement over the counter such as Culturelle.It is important to re-colonize the gut with good bacteria and also to prevent any diarrheal infections associated with antibiotic use.    Labs, chest xray this week  Let me know about physical therapy

## 2023-05-07 ENCOUNTER — Encounter: Payer: Self-pay | Admitting: Family

## 2023-05-07 DIAGNOSIS — Z8616 Personal history of COVID-19: Secondary | ICD-10-CM | POA: Insufficient documentation

## 2023-05-07 NOTE — Assessment & Plan Note (Signed)
 Spontaneously draining abscess.  Wound culture collected.  Start doxycycline.  Close follow-up

## 2023-05-07 NOTE — Assessment & Plan Note (Signed)
 Recent history of COVID.  Persistent fatigue. Pending labs, chest x-ray

## 2023-05-07 NOTE — Assessment & Plan Note (Signed)
 Chronic, stable.  History of labile blood pressure.   Continue amlodipine 5 mg every morning, hydralazine 10 mg 3 times daily in the morning,lunch and dinner, losartan 100 mg every night,   toprol 100 mg  lunch time, and hctz 12.5mg  QD.    Continue additional dose of amlodipine 2.5 mg if needed for blood pressure greater than 140/80.   Labs obtained.

## 2023-05-08 LAB — WOUND CULTURE
MICRO NUMBER:: 16250658
SPECIMEN QUALITY:: ADEQUATE

## 2023-05-10 ENCOUNTER — Other Ambulatory Visit

## 2023-05-10 ENCOUNTER — Ambulatory Visit (INDEPENDENT_AMBULATORY_CARE_PROVIDER_SITE_OTHER)

## 2023-05-10 ENCOUNTER — Other Ambulatory Visit (INDEPENDENT_AMBULATORY_CARE_PROVIDER_SITE_OTHER)

## 2023-05-10 DIAGNOSIS — I10 Essential (primary) hypertension: Secondary | ICD-10-CM

## 2023-05-10 DIAGNOSIS — I700 Atherosclerosis of aorta: Secondary | ICD-10-CM | POA: Diagnosis not present

## 2023-05-10 DIAGNOSIS — U071 COVID-19: Secondary | ICD-10-CM | POA: Diagnosis not present

## 2023-05-10 DIAGNOSIS — R5383 Other fatigue: Secondary | ICD-10-CM | POA: Diagnosis not present

## 2023-05-10 DIAGNOSIS — J984 Other disorders of lung: Secondary | ICD-10-CM | POA: Diagnosis not present

## 2023-05-11 DIAGNOSIS — N281 Cyst of kidney, acquired: Secondary | ICD-10-CM | POA: Diagnosis not present

## 2023-05-11 DIAGNOSIS — I129 Hypertensive chronic kidney disease with stage 1 through stage 4 chronic kidney disease, or unspecified chronic kidney disease: Secondary | ICD-10-CM | POA: Diagnosis not present

## 2023-05-11 DIAGNOSIS — N1832 Chronic kidney disease, stage 3b: Secondary | ICD-10-CM | POA: Diagnosis not present

## 2023-05-11 DIAGNOSIS — I10 Essential (primary) hypertension: Secondary | ICD-10-CM | POA: Diagnosis not present

## 2023-05-11 DIAGNOSIS — R809 Proteinuria, unspecified: Secondary | ICD-10-CM | POA: Diagnosis not present

## 2023-05-11 DIAGNOSIS — R600 Localized edema: Secondary | ICD-10-CM | POA: Diagnosis not present

## 2023-05-11 LAB — CBC WITH DIFFERENTIAL/PLATELET
Basophils Absolute: 0.1 10*3/uL (ref 0.0–0.1)
Basophils Relative: 1 % (ref 0.0–3.0)
Eosinophils Absolute: 0.3 10*3/uL (ref 0.0–0.7)
Eosinophils Relative: 2.8 % (ref 0.0–5.0)
HCT: 34.8 % — ABNORMAL LOW (ref 36.0–46.0)
Hemoglobin: 11.6 g/dL — ABNORMAL LOW (ref 12.0–15.0)
Lymphocytes Relative: 15.9 % (ref 12.0–46.0)
Lymphs Abs: 1.7 10*3/uL (ref 0.7–4.0)
MCHC: 33.2 g/dL (ref 30.0–36.0)
MCV: 91.7 fl (ref 78.0–100.0)
Monocytes Absolute: 0.8 10*3/uL (ref 0.1–1.0)
Monocytes Relative: 7.7 % (ref 3.0–12.0)
Neutro Abs: 8 10*3/uL — ABNORMAL HIGH (ref 1.4–7.7)
Neutrophils Relative %: 72.6 % (ref 43.0–77.0)
Platelets: 403 10*3/uL — ABNORMAL HIGH (ref 150.0–400.0)
RBC: 3.8 Mil/uL — ABNORMAL LOW (ref 3.87–5.11)
RDW: 15.7 % — ABNORMAL HIGH (ref 11.5–15.5)
WBC: 11 10*3/uL — ABNORMAL HIGH (ref 4.0–10.5)

## 2023-05-11 LAB — COMPREHENSIVE METABOLIC PANEL WITH GFR
ALT: 6 U/L (ref 0–35)
AST: 10 U/L (ref 0–37)
Albumin: 3.5 g/dL (ref 3.5–5.2)
Alkaline Phosphatase: 103 U/L (ref 39–117)
BUN: 33 mg/dL — ABNORMAL HIGH (ref 6–23)
CO2: 23 meq/L (ref 19–32)
Calcium: 8.7 mg/dL (ref 8.4–10.5)
Chloride: 100 meq/L (ref 96–112)
Creatinine, Ser: 1.38 mg/dL — ABNORMAL HIGH (ref 0.40–1.20)
GFR: 34.48 mL/min — ABNORMAL LOW (ref >60.00)
Glucose, Bld: 102 mg/dL — ABNORMAL HIGH (ref 70–99)
Potassium: 3.6 meq/L (ref 3.5–5.1)
Sodium: 135 meq/L (ref 135–145)
Total Bilirubin: 0.3 mg/dL (ref 0.2–1.2)
Total Protein: 6.7 g/dL (ref 6.0–8.3)

## 2023-05-11 LAB — TSH: TSH: 2.19 u[IU]/mL (ref 0.35–5.50)

## 2023-05-11 LAB — VITAMIN D 25 HYDROXY (VIT D DEFICIENCY, FRACTURES): VITD: 35.07 ng/mL (ref 30.00–100.00)

## 2023-05-12 ENCOUNTER — Encounter: Payer: Self-pay | Admitting: Family

## 2023-05-12 ENCOUNTER — Telehealth: Payer: Self-pay | Admitting: Family

## 2023-05-12 ENCOUNTER — Ambulatory Visit: Admitting: Family Medicine

## 2023-05-12 ENCOUNTER — Ambulatory Visit: Admitting: Family

## 2023-05-12 VITALS — BP 128/76 | HR 72 | Temp 97.8°F | Ht 67.0 in | Wt 136.3 lb

## 2023-05-12 DIAGNOSIS — L0291 Cutaneous abscess, unspecified: Secondary | ICD-10-CM | POA: Diagnosis not present

## 2023-05-12 MED ORDER — DOXYCYCLINE HYCLATE 100 MG PO TABS: 100.0000 mg | ORAL_TABLET | Freq: Two times a day (BID) | ORAL | 0 refills | Status: AC

## 2023-05-12 MED ORDER — CULTURELLE ADULT ULT BALANCE PO CAPS: 1.0000 | ORAL_CAPSULE | Freq: Every day | ORAL | 1 refills | Status: DC

## 2023-05-12 NOTE — Patient Instructions (Addendum)
 Schedule non fasting lab in 2-3 weeks to recheck white blood cells since elevated.   Referral to general surgery for incision and drainage.   I have refilled doxycycline for 7 days.  Please start Culturelle, probiotic, I also sent this to your pharmacy

## 2023-05-12 NOTE — Progress Notes (Unsigned)
 Assessment & Plan:  There are no diagnoses linked to this encounter.   Return precautions given.   Risks, benefits, and alternatives of the medications and treatment plan prescribed today were discussed, and patient expressed understanding.   Education regarding symptom management and diagnosis given to patient on AVS either electronically or printed.  No follow-ups on file.  Rennie Plowman, FNP  Subjective:    [name]: Mia Perez, female    DOB: 12-Jun-1933, 89 y.o.   MRN: [id number]  CC: Mia Perez is a 89 y.o. female who presents today for follow up.   HPI: Follow-up right breast abscess. Started doxycycline    Pending chest x-ray  Labs obtained 05/10/2023.  CBC 11--> 13, Neutro Abs 8--> 9.8  Allergies: Pravachol [pravastatin] and Penicillins Current Outpatient Medications on File Prior to Visit  Medication Sig Dispense Refill   amLODipine (NORVASC) 2.5 MG tablet TAKE 1 TABLET BY MOUTH DAILY IF BP IS >140/90 2.5 MG 90 tablet 0   amLODipine (NORVASC) 5 MG tablet Take 1 tablet (5 mg total) by mouth daily. 90 tablet 3   aspirin 325 MG tablet Take 325 mg by mouth daily.     budesonide (ENTOCORT EC) 3 MG 24 hr capsule TAKE 3 CAPSULES (9 MG TOTAL) BY MOUTH DAILY. (Patient taking differently: Take 3 mg by mouth every other day.) 270 capsule 11   cholecalciferol (VITAMIN D) 25 MCG (1000 UNIT) tablet Take 1,000 Units by mouth daily.     Cyanocobalamin (VITAMIN B-12) 5000 MCG TBDP Take 500 mcg by mouth daily.     dorzolamide (TRUSOPT) 2 % ophthalmic solution 1 drop 2 (two) times daily.     dorzolamidel-timolol (COSOPT) 22.3-6.8 MG/ML SOLN ophthalmic solution Place 1 drop into both eyes 2 (two) times daily. Take one drop in both eyes in the morning & evening before bed.     doxycycline (VIBRA-TABS) 100 MG tablet Take 1 tablet (100 mg total) by mouth 2 (two) times daily for 7 days. 14 tablet 0   ezetimibe (ZETIA) 10 MG tablet Take 1 tablet (10 mg total) by mouth daily.  90 tablet 3   fluticasone (FLONASE) 50 MCG/ACT nasal spray Place 2 sprays into both nostrils daily. 16 g 6   hydrALAZINE (APRESOLINE) 10 MG tablet TAKE 1 TABLET BY MOUTH THREE TIMES A DAY 90 tablet 0   hydrochlorothiazide (MICROZIDE) 12.5 MG capsule Take 1 capsule (12.5 mg total) by mouth daily as needed. Take for blood pressure > 145/80 90 capsule 1   losartan (COZAAR) 100 MG tablet TAKE 1 TABLET BY MOUTH EVERY DAY IN THE EVENING (100 MG) 90 tablet 1   LUMIGAN 0.01 % SOLN Place 1 drop into both eyes at bedtime.      metoprolol succinate (TOPROL-XL) 100 MG 24 hr tablet TAKE 1 TABLET BY MOUTH WITH OR IMMEDIATELY FOLLOWING A MEAL. 90 tablet 3   Multiple Vitamins-Minerals (PRESERVISION AREDS 2 PO) Take 1 tablet by mouth 2 (two) times daily.      simethicone (MYLICON) 125 MG chewable tablet Chew 125 mg by mouth every morning.     No current facility-administered medications on file prior to visit.    Review of Systems    Objective:    BP 128/76   Pulse 72   Temp 97.8 F (36.6 C) (Oral)   Ht 5\' 7"  (1.702 m)   Wt 136 lb 4.8 oz (61.8 kg)   SpO2 97%   BMI 21.35 kg/m  BP Readings from Last 3 Encounters:  05/12/23 128/76  05/05/23 136/72  04/12/23 130/72   Wt Readings from Last 3 Encounters:  05/12/23 136 lb 4.8 oz (61.8 kg)  05/05/23 141 lb 3.2 oz (64 kg)  04/12/23 141 lb 6.4 oz (64.1 kg)     Physical Exam Vitals reviewed.  Constitutional:      Appearance: She is well-developed.  Eyes:     Conjunctiva/sclera: Conjunctivae normal.  Cardiovascular:     Rate and Rhythm: Normal rate and regular rhythm.     Pulses: Normal pulses.     Heart sounds: Normal heart sounds.  Pulmonary:     Effort: Pulmonary effort is normal.     Breath sounds: Normal breath sounds. No wheezing, rhonchi or rales.  Skin:    General: Skin is warm and dry.  Neurological:     Mental Status: She is alert.  Psychiatric:        Speech: Speech normal.        Behavior: Behavior normal.        Thought  Content: Thought content normal.

## 2023-05-12 NOTE — Telephone Encounter (Signed)
 Call [facility]  Cxr is still pending from last week Patient had COVID and she has leukocytosis.  Concern for pneumonia.  Can you get stat read?

## 2023-05-12 NOTE — Telephone Encounter (Signed)
 Called reading room and requested that the chest xray be read STAT due to concern for pneumonia per Rennie Plowman. The man at the reading room states the radiologist will read it soon and send it  over.

## 2023-05-13 ENCOUNTER — Other Ambulatory Visit: Payer: Self-pay | Admitting: Family

## 2023-05-13 ENCOUNTER — Encounter: Payer: Self-pay | Admitting: Family

## 2023-05-13 DIAGNOSIS — N1832 Chronic kidney disease, stage 3b: Secondary | ICD-10-CM | POA: Diagnosis not present

## 2023-05-13 DIAGNOSIS — R809 Proteinuria, unspecified: Secondary | ICD-10-CM | POA: Diagnosis not present

## 2023-05-13 DIAGNOSIS — N281 Cyst of kidney, acquired: Secondary | ICD-10-CM | POA: Diagnosis not present

## 2023-05-13 DIAGNOSIS — R600 Localized edema: Secondary | ICD-10-CM | POA: Diagnosis not present

## 2023-05-13 DIAGNOSIS — R9389 Abnormal findings on diagnostic imaging of other specified body structures: Secondary | ICD-10-CM

## 2023-05-13 DIAGNOSIS — I10 Essential (primary) hypertension: Secondary | ICD-10-CM | POA: Diagnosis not present

## 2023-05-13 DIAGNOSIS — I129 Hypertensive chronic kidney disease with stage 1 through stage 4 chronic kidney disease, or unspecified chronic kidney disease: Secondary | ICD-10-CM | POA: Diagnosis not present

## 2023-05-13 NOTE — Assessment & Plan Note (Addendum)
 Subtle improvement.  Less purulence expressed from abscess on exam today.  Fortunately no systemic features. concern patient may require surgical incision. H/o breast cancer. Mammogram up to date 02/2023 , BI RADS 1.    Referral placed to general surgery.  Appointment scheduled 05/17/23.  Refilled doxycycline.  Advised probiotics

## 2023-05-17 ENCOUNTER — Ambulatory Visit: Admitting: Surgery

## 2023-05-18 DIAGNOSIS — H2511 Age-related nuclear cataract, right eye: Secondary | ICD-10-CM | POA: Diagnosis not present

## 2023-05-18 DIAGNOSIS — H25041 Posterior subcapsular polar age-related cataract, right eye: Secondary | ICD-10-CM | POA: Diagnosis not present

## 2023-05-18 DIAGNOSIS — H353132 Nonexudative age-related macular degeneration, bilateral, intermediate dry stage: Secondary | ICD-10-CM | POA: Diagnosis not present

## 2023-05-21 ENCOUNTER — Other Ambulatory Visit: Payer: Self-pay | Admitting: Family

## 2023-05-21 DIAGNOSIS — I10 Essential (primary) hypertension: Secondary | ICD-10-CM

## 2023-05-24 ENCOUNTER — Ambulatory Visit: Admitting: Surgery

## 2023-05-24 ENCOUNTER — Encounter: Payer: Self-pay | Admitting: Surgery

## 2023-05-24 VITALS — BP 126/72 | HR 72 | Temp 98.0°F | Ht 67.0 in | Wt 131.8 lb

## 2023-05-24 DIAGNOSIS — L089 Local infection of the skin and subcutaneous tissue, unspecified: Secondary | ICD-10-CM

## 2023-05-24 DIAGNOSIS — L723 Sebaceous cyst: Secondary | ICD-10-CM | POA: Diagnosis not present

## 2023-05-24 MED ORDER — ONDANSETRON HCL 4 MG PO TABS: 4.0000 mg | ORAL_TABLET | Freq: Three times a day (TID) | ORAL | 0 refills | Status: DC | PRN

## 2023-05-24 MED ORDER — DOXYCYCLINE HYCLATE 100 MG PO CAPS: 100.0000 mg | ORAL_CAPSULE | Freq: Two times a day (BID) | ORAL | 0 refills | Status: AC

## 2023-05-24 NOTE — Progress Notes (Signed)
 05/24/2023  Reason for Visit:  Infected sebaceous cyst of the right breast  Requesting Provider:  Rennie Plowman, FNP  History of Present Illness: Mia Perez is a 89 y.o. female presenting for evaluation of an infected sebaceous cyst of the right breast.  The patient reports that she has been dealing with this since at least late February/early March.  She saw her PCP on 05/05/2023 and a wound culture was obtained and she was started on doxycycline.  She was seen again on 05/12/23 and the wound had not significantly improved so she was referred to surgery.  The patient was initially scheduled for an appointment on 05/17/2023, but the patient rescheduled for today.  The patient reports that doxycycline did give her nausea but she completed the course.  She reports that there is still some intermittent drainage from the wound but denies any worsening pain.  She denies any significant redness in the area.  Past Medical History: Past Medical History:  Diagnosis Date   Acute otalgia, left 08/06/2020   Anemia    H/O   Arthritis    Breast cancer (HCC) 02/10/1996   left, lumpectomy and radiation   Breast cancer (HCC) 2017   right   DVT (deep venous thrombosis) (HCC) 11/2014   GERD (gastroesophageal reflux disease)    Hypertension    Lymphocytic colitis    Personal history of radiation therapy 1998, 2017   BREAST CA   Ulcerative colitis (HCC) 2014   Varicose veins      Past Surgical History: Past Surgical History:  Procedure Laterality Date   BREAST EXCISIONAL BIOPSY Right 2014   NEG   BREAST LUMPECTOMY Left 1998   radiation   BREAST LUMPECTOMY Right 2017   BREAST LUMPECTOMY WITH SENTINEL LYMPH NODE BIOPSY Right 12/30/2015   8 mm, T1b, N0; RUOQ, ER 95%; PR: 80 %, Her 2 neu not overexpressed.Mammosite Radiation.   BREAST SURGERY Right 2014   Microcalcifications excised with wire localization/ Dr Lemar Livings   COLONOSCOPY  2012   Dr Kinnie Scales   DILATION AND CURETTAGE OF UTERUS      HERNIA REPAIR     HYSTEROSCOPY     MELANOMA EXCISION     middle of chest between breasts, Dr Roseanne Kaufman   NECK SURGERY     PERIPHERAL VASCULAR CATHETERIZATION N/A 12/13/2014   Procedure: IVC Filter Insertion;  Surgeon: Annice Needy, MD;  Location: [facility];  Service: Cardiovascular;  Laterality: N/A;   PERIPHERAL VASCULAR CATHETERIZATION  12/13/2014   Procedure: Lower Extremity Intervention;  Surgeon: Annice Needy, MD;  Location: [facility];  Service: Cardiovascular;;   PERIPHERAL VASCULAR CATHETERIZATION N/A 02/20/2015   Procedure: IVC Filter Removal;  Surgeon: Annice Needy, MD;  Location: [facility];  Service: Cardiovascular;  Laterality: N/A;    Home Medications: Prior to Admission medications   Medication Sig Start Date End Date Taking? Authorizing Provider  doxycycline (VIBRAMYCIN) 100 MG capsule Take 1 capsule (100 mg total) by mouth 2 (two) times daily for 7 days. 05/24/23 05/31/23 Yes Dannica Bickham, Elita Quick, MD  ondansetron (ZOFRAN) 4 MG tablet Take 1 tablet (4 mg total) by mouth every 8 (eight) hours as needed for nausea or vomiting. 05/24/23  Yes Yovanni Frenette, MD  amLODipine (NORVASC) 2.5 MG tablet TAKE 1 TABLET BY MOUTH DAILY IF BP IS >140/90 2.5 MG 02/24/23   Allegra Grana, FNP  amLODipine (NORVASC) 5 MG tablet Take 1 tablet (5 mg total) by mouth daily. 05/05/23  Allegra Grana, FNP  aspirin 325 MG tablet Take 325 mg by mouth daily.    [provider]  budesonide (ENTOCORT EC) 3 MG 24 hr capsule TAKE 3 CAPSULES (9 MG TOTAL) BY MOUTH DAILY. Patient taking differently: Take 3 mg by mouth every other day. 09/22/21   Toney Reil, MD  cholecalciferol (VITAMIN D) 25 MCG (1000 UNIT) tablet Take 1,000 Units by mouth daily.    [provider]  Cyanocobalamin (VITAMIN B-12) 5000 MCG TBDP Take 500 mcg by mouth daily.    [provider]  dorzolamide (TRUSOPT) 2 % ophthalmic solution 1 drop 2 (two) times daily. 05/22/22   [provider]  dorzolamidel-timolol (COSOPT) 22.3-6.8 MG/ML SOLN ophthalmic solution Place 1 drop into both eyes 2 (two) times daily. Take one drop in both eyes in the morning & evening before bed.    [provider]  ezetimibe (ZETIA) 10 MG tablet Take 1 tablet (10 mg total) by mouth daily. 11/05/22   Allegra Grana, FNP  fluticasone (FLONASE) 50 MCG/ACT nasal spray Place 2 sprays into both nostrils daily. 11/05/22   Allegra Grana, FNP  hydrALAZINE (APRESOLINE) 10 MG tablet TAKE 1 TABLET BY MOUTH THREE TIMES A DAY 05/22/23   Worthy Rancher B, FNP  hydrochlorothiazide (MICROZIDE) 12.5 MG capsule Take 1 capsule (12.5 mg total) by mouth daily as needed. Take for blood pressure > 145/80 05/05/23   Allegra Grana, FNP  losartan (COZAAR) 100 MG tablet TAKE 1 TABLET BY MOUTH EVERY DAY IN THE EVENING (100 MG) 05/05/23   Allegra Grana, FNP  LUMIGAN 0.01 % SOLN Place 1 drop into both eyes at bedtime.  12/09/15   [provider]  metoprolol succinate (TOPROL-XL) 100 MG 24 hr tablet TAKE 1 TABLET BY MOUTH WITH OR IMMEDIATELY FOLLOWING A MEAL. 03/26/23   Allegra Grana, FNP  Multiple Vitamins-Minerals (PRESERVISION AREDS 2 PO) Take 1 tablet by mouth 2 (two) times daily.     [provider]  simethicone (MYLICON) 125 MG chewable tablet Chew 125 mg by mouth every morning.    [provider]    Allergies: Allergies  Allergen Reactions   Pravachol [Pravastatin]     diarrhea   Penicillins Rash    Has patient had a PCN reaction causing immediate rash, facial/tongue/throat swelling, SOB or lightheadedness with hypotension: Yes Has patient had a PCN reaction causing severe rash involving mucus membranes or skin necrosis: Unknown Has patient had a PCN reaction that required hospitalization No Has patient had a PCN reaction occurring within the last 10 years: No If all of the above answers are "NO", then may proceed with Cephalosporin use.     Social  History:  reports that she has never smoked. She has never used smokeless tobacco. She reports that she does not drink alcohol and does not use drugs.   Family History: Family History  Problem Relation Age of Onset   Heart disease Mother    Hypertension Mother    Ovarian cancer Mother 31   Hypertension Sister    Hypertension Brother    Ovarian cancer Paternal Aunt 75   Breast cancer Neg Hx     Review of Systems: Review of Systems  Constitutional:  Negative for chills and fever.  Respiratory:  Negative for shortness of breath.   Cardiovascular:  Negative for chest pain.  Gastrointestinal:  Positive for nausea.  Skin:        Infected cyst right breast    Physical  Exam BP 126/72   Pulse 72   Temp 98 F (36.7 C) (Oral)   Ht 5\' 7"  (1.702 m)   Wt 131 lb 12.8 oz (59.8 kg)   SpO2 98%   BMI 20.64 kg/m  CONSTITUTIONAL: No acute distress, well-nourished HEENT:  Normocephalic, atraumatic, extraocular motion intact. RESPIRATORY:  Normal respiratory effort without pathologic use of accessory muscles. CARDIOVASCULAR: Regular rhythm and rate BREAST: Right breast has a wound that measures about 5 x 10 mm in the upper outer quadrant about 3 cm from the nipple.  There are some fibrinous material at the skin edges which was debrided sharply using scissors.  On manual pressure of the area, there was some purulent fluid that could be expressed.  The wound was further probed with a Q-tip and was able to enter the cyst cavity and express more fluid out.  This was then packed with a 2 x 2 gauze and dressed with dry gauze dressing. MUSCULOSKELETAL:  Normal muscle strength and tone in all four extremities.  No peripheral edema or cyanosis. NEUROLOGIC:  Motor and sensation is grossly normal.  Cranial nerves are grossly intact. PSYCH:  Alert and oriented to person, place and time. Affect is normal.  Laboratory Analysis: No results found for this or any previous visit (from the past 24  hours).  Imaging: No results found.  Assessment and Plan: This is a 89 y.o. female with a infected sebaceous cyst of the right breast.  - Discussed with the patient that likely what has been happening is that the wound was trying to partially close but the deeper layers had not healed well yet.  This allows for undrained component to continue to spread.  After probing the wound better I think we have been able to allow this to drain more easily.  Discussed with her that the gauze/packing part of the wound she can pull on/16/25 and then continue with dry gauze dressings that she had been doing before.  As a precaution we will order 1 more week of doxycycline to continue fighting this infection.  Cultures obtained by her PCP did show MRSA. - Patient will follow-up with me in a week to reevaluate her wound.  Discussed with patient that in the future, we will schedule her for an office procedure to excise the cyst so this does not come back again.  The patient has had a procedure done in the past with dermatology for this as well in a different location.  I spent 30 minutes dedicated to the care of this patient on the date of this encounter to include pre-visit review of records, face-to-face time with the patient discussing diagnosis and management, and any post-visit coordination of care.   Marene Shape, MD [facility]

## 2023-05-24 NOTE — Patient Instructions (Addendum)
 You can remove the dressing on Wednesday night and you may shower. You can replace it with a daily gauge dressing. If you have any questions please call the office.   I sent in a prescription for doxycycline and Zofran to your pharmacy

## 2023-05-25 ENCOUNTER — Other Ambulatory Visit (INDEPENDENT_AMBULATORY_CARE_PROVIDER_SITE_OTHER)

## 2023-05-25 DIAGNOSIS — L0291 Cutaneous abscess, unspecified: Secondary | ICD-10-CM | POA: Diagnosis not present

## 2023-05-25 LAB — CBC WITH DIFFERENTIAL/PLATELET
Basophils Absolute: 0 10*3/uL (ref 0.0–0.1)
Basophils Relative: 0.3 % (ref 0.0–3.0)
Eosinophils Absolute: 0.2 10*3/uL (ref 0.0–0.7)
Eosinophils Relative: 1.6 % (ref 0.0–5.0)
HCT: 34.6 % — ABNORMAL LOW (ref 36.0–46.0)
Hemoglobin: 11.3 g/dL — ABNORMAL LOW (ref 12.0–15.0)
Lymphocytes Relative: 16.7 % (ref 12.0–46.0)
Lymphs Abs: 1.8 10*3/uL (ref 0.7–4.0)
MCHC: 32.7 g/dL (ref 30.0–36.0)
MCV: 92.6 fl (ref 78.0–100.0)
Monocytes Absolute: 0.8 10*3/uL (ref 0.1–1.0)
Monocytes Relative: 7.6 % (ref 3.0–12.0)
Neutro Abs: 7.8 10*3/uL — ABNORMAL HIGH (ref 1.4–7.7)
Neutrophils Relative %: 73.8 % (ref 43.0–77.0)
Platelets: 304 10*3/uL (ref 150.0–400.0)
RBC: 3.73 Mil/uL — ABNORMAL LOW (ref 3.87–5.11)
RDW: 16.1 % — ABNORMAL HIGH (ref 11.5–15.5)
WBC: 10.6 10*3/uL — ABNORMAL HIGH (ref 4.0–10.5)

## 2023-05-31 ENCOUNTER — Encounter: Payer: Self-pay | Admitting: Surgery

## 2023-05-31 ENCOUNTER — Ambulatory Visit: Admitting: Surgery

## 2023-05-31 VITALS — BP 140/72 | HR 63 | Temp 98.1°F | Ht 67.0 in

## 2023-05-31 DIAGNOSIS — L089 Local infection of the skin and subcutaneous tissue, unspecified: Secondary | ICD-10-CM

## 2023-05-31 DIAGNOSIS — L723 Sebaceous cyst: Secondary | ICD-10-CM | POA: Diagnosis not present

## 2023-05-31 NOTE — Progress Notes (Signed)
 05/31/2023  History of Present Illness: Mia Perez is a 89 y.o. female presenting for follow up of her right breast infected sebaceous cyst.  Her antibiotic course was extended on her last visit.  She reports the wound is doing well, with some drainage still, but denies any pain or foul odor.  Past Medical History: Past Medical History:  Diagnosis Date   Acute otalgia, left 08/06/2020   Anemia    H/O   Arthritis    Breast cancer (HCC) 02/10/1996   left, lumpectomy and radiation   Breast cancer (HCC) 2017   right   DVT (deep venous thrombosis) (HCC) 11/2014   GERD (gastroesophageal reflux disease)    Hypertension    Lymphocytic colitis    Personal history of radiation therapy 1998, 2017   BREAST CA   Ulcerative colitis (HCC) 2014   Varicose veins      Past Surgical History: Past Surgical History:  Procedure Laterality Date   BREAST EXCISIONAL BIOPSY Right 2014   NEG   BREAST LUMPECTOMY Left 1998   radiation   BREAST LUMPECTOMY Right 2017   BREAST LUMPECTOMY WITH SENTINEL LYMPH NODE BIOPSY Right 12/30/2015   8 mm, T1b, N0; RUOQ, ER 95%; PR: 80 %, Her 2 neu not overexpressed.Mammosite Radiation.   BREAST SURGERY Right 2014   Microcalcifications excised with wire localization/ Dr Marquita Situ   COLONOSCOPY  2012   Dr Andriette Keeling   DILATION AND CURETTAGE OF UTERUS     HERNIA REPAIR     HYSTEROSCOPY     MELANOMA EXCISION     middle of chest between breasts, Dr Lehman Pummel   NECK SURGERY     PERIPHERAL VASCULAR CATHETERIZATION N/A 12/13/2014   Procedure: IVC Filter Insertion;  Surgeon: Celso College, MD;  Location: [facility];  Service: Cardiovascular;  Laterality: N/A;   PERIPHERAL VASCULAR CATHETERIZATION  12/13/2014   Procedure: Lower Extremity Intervention;  Surgeon: Celso College, MD;  Location: [facility];  Service: Cardiovascular;;   PERIPHERAL VASCULAR CATHETERIZATION N/A 02/20/2015   Procedure: IVC Filter Removal;  Surgeon: Celso College, MD;  Location:  [facility];  Service: Cardiovascular;  Laterality: N/A;    Home Medications: Prior to Admission medications   Medication Sig Start Date End Date Taking? Authorizing Provider  amLODipine  (NORVASC ) 2.5 MG tablet TAKE 1 TABLET BY MOUTH DAILY IF BP IS >140/90 2.5 MG 02/24/23  Yes Arnett, Hanley Lew, FNP  amLODipine  (NORVASC ) 5 MG tablet Take 1 tablet (5 mg total) by mouth daily. 05/05/23  Yes Calista Catching, FNP  aspirin 325 MG tablet Take 325 mg by mouth daily.   Yes [provider]  budesonide  (ENTOCORT EC ) 3 MG 24 hr capsule TAKE 3 CAPSULES (9 MG TOTAL) BY MOUTH DAILY. Patient taking differently: Take 3 mg by mouth every other day. 09/22/21  Yes Vanga, Elson Halon, MD  cholecalciferol (VITAMIN D ) 25 MCG (1000 UNIT) tablet Take 1,000 Units by mouth daily.   Yes [provider]  Cyanocobalamin (VITAMIN B-12) 5000 MCG TBDP Take 500 mcg by mouth daily.   Yes [provider]  dorzolamide (TRUSOPT) 2 % ophthalmic solution 1 drop 2 (two) times daily. 05/22/22  Yes [provider]  dorzolamidel-timolol (COSOPT) 22.3-6.8 MG/ML SOLN ophthalmic solution Place 1 drop into both eyes 2 (two) times daily. Take one drop in both eyes in the morning & evening before bed.   Yes [provider]  doxycycline  (VIBRAMYCIN ) 100 MG capsule Take 1 capsule (100  mg total) by mouth 2 (two) times daily for 7 days. 05/24/23 05/31/23 Yes Hyder Deman, Volanda Gruber, MD  ezetimibe  (ZETIA ) 10 MG tablet Take 1 tablet (10 mg total) by mouth daily. 11/05/22  Yes Arnett, Hanley Lew, FNP  fluticasone  (FLONASE ) 50 MCG/ACT nasal spray Place 2 sprays into both nostrils daily. 11/05/22  Yes Calista Catching, FNP  hydrALAZINE  (APRESOLINE ) 10 MG tablet TAKE 1 TABLET BY MOUTH THREE TIMES A DAY 05/22/23  Yes Webb, Padonda B, FNP  hydrochlorothiazide  (MICROZIDE ) 12.5 MG capsule Take 1 capsule (12.5 mg total) by mouth daily as needed. Take for blood pressure > 145/80 05/05/23  Yes Arnett, Hanley Lew, FNP   losartan  (COZAAR ) 100 MG tablet TAKE 1 TABLET BY MOUTH EVERY DAY IN THE EVENING (100 MG) 05/05/23  Yes Arnett, Hanley Lew, FNP  LUMIGAN 0.01 % SOLN Place 1 drop into both eyes at bedtime.  12/09/15  Yes [provider]  metoprolol  succinate (TOPROL -XL) 100 MG 24 hr tablet TAKE 1 TABLET BY MOUTH WITH OR IMMEDIATELY FOLLOWING A MEAL. 03/26/23  Yes Arnett, Hanley Lew, FNP  Multiple Vitamins-Minerals (PRESERVISION AREDS 2 PO) Take 1 tablet by mouth 2 (two) times daily.    Yes [provider]  ondansetron  (ZOFRAN ) 4 MG tablet Take 1 tablet (4 mg total) by mouth every 8 (eight) hours as needed for nausea or vomiting. 05/24/23  Yes Dragan Tamburrino, MD  simethicone (MYLICON) 125 MG chewable tablet Chew 125 mg by mouth every morning.   Yes [provider]    Allergies: Allergies  Allergen Reactions   Pravachol  [Pravastatin ]     diarrhea   Penicillins Rash    Has patient had a PCN reaction causing immediate rash, facial/tongue/throat swelling, SOB or lightheadedness with hypotension: Yes Has patient had a PCN reaction causing severe rash involving mucus membranes or skin necrosis: Unknown Has patient had a PCN reaction that required hospitalization No Has patient had a PCN reaction occurring within the last 10 years: No If all of the above answers are "NO", then may proceed with Cephalosporin use.     Review of Systems: Review of Systems  Constitutional:  Negative for chills and fever.  Skin:  Negative for itching and rash.    Physical Exam BP (!) 140/72   Pulse 63   Temp 98.1 F (36.7 C)   Ht 5\' 7"  (1.702 m)   SpO2 97%   BMI 20.64 kg/m  CONSTITUTIONAL: No acute distress BREAST:  Right breast upper outer quadrant wound is about 1 cm in size, with healthy granulation tissue and edges.  No purulence.  The cavity of the cyst itself can be seen deeply, but no purulence coming from it.  Dry gauze dressing applied. NEUROLOGIC:  Motor and sensation is grossly normal.   Cranial nerves are grossly intact. PSYCH:  Alert and oriented to person, place and time. Affect is normal.   Assessment and Plan: This is a 89 y.o. female with infected cyst of the right breast.  --Discussed with the patient that the wound edges are healthy and healing appropriately.  No other procedures are needed at this point.  Continue dry gauze dressing change daily.  Discussed with her that as the wound heals, the cyst will seal up again.  Discussed in the future we would bring her back for excision of this cyst to prevent further infection episodes. --Follow up in 2 weeks to recheck her wound.  I spent 10 minutes dedicated to the care of this patient on the date of this  encounter to include pre-visit review of records, face-to-face time with the patient discussing diagnosis and management, and any post-visit coordination of care.   Marene Shape, MD [facility]

## 2023-05-31 NOTE — Patient Instructions (Signed)
 Continue once daily dressing changes. When you shower remove the dressing, wash as normal, rinse well, and pat dry and redress the area.  Follow up here in 2 weeks.     Please call and ask to speak with a nurse if you develop questions or concerns.

## 2023-06-02 ENCOUNTER — Ambulatory Visit
Admission: RE | Admit: 2023-06-02 | Discharge: 2023-06-02 | Disposition: A | Discharge status: Home / Self Care | Source: Ambulatory Visit | Attending: Family | Admitting: Family

## 2023-06-02 DIAGNOSIS — R9389 Abnormal findings on diagnostic imaging of other specified body structures: Secondary | ICD-10-CM | POA: Insufficient documentation

## 2023-06-02 DIAGNOSIS — R918 Other nonspecific abnormal finding of lung field: Secondary | ICD-10-CM | POA: Diagnosis not present

## 2023-06-08 ENCOUNTER — Ambulatory Visit (INDEPENDENT_AMBULATORY_CARE_PROVIDER_SITE_OTHER): Payer: Medicare PPO | Admitting: *Deleted

## 2023-06-08 ENCOUNTER — Encounter: Payer: Self-pay | Admitting: Ophthalmology

## 2023-06-08 VITALS — BP 125/67 | Ht 67.0 in | Wt 133.0 lb

## 2023-06-08 DIAGNOSIS — Z0000 Encounter for general adult medical examination without abnormal findings: Secondary | ICD-10-CM | POA: Diagnosis not present

## 2023-06-08 NOTE — Anesthesia Preprocedure Evaluation (Addendum)
 Anesthesia Evaluation  Patient identified by MRN, date of birth, ID band Patient awake    Reviewed: Allergy & Precautions, H&P , NPO status , Patient's Chart, lab work & pertinent test results  Airway Mallampati: II  TM Distance: >3 FB Neck ROM: full    Dental  (+) Upper Dentures   Pulmonary  CT chest 4/25: IMPRESSION: 1. Opacity on chest radiograph of 05/10/2023 corresponds to an area of linear scarring at the left lung base. 2. 4 mm noncalcified pulmonary nodule within the left upper lobe. No follow-up needed if patient is low-risk.This recommendation follows the consensus statement: Guidelines for Management of Incidental Pulmonary Nodules Detected on CT Images: From the Fleischner Society 2017; Radiology 2017; 284:228-243. 3. Enlarged central pulmonary arteries in keeping with changes of pulmonary arterial hypertension, new since prior examination. 4. Mild coronary artery calcification. 5. Progressive renal atrophy.      Pulmonary exam normal        Cardiovascular hypertension, pulmonary hypertensionNormal cardiovascular exam     Neuro/Psych negative neurological ROS  negative psych ROS   GI/Hepatic Neg liver ROS,GERD  ,,  Endo/Other  negative endocrine ROS    Renal/GU Renal InsufficiencyRenal disease     Musculoskeletal  (+) Arthritis ,    Abdominal Normal abdominal exam  (+)   Peds  Hematology  (+) Blood dyscrasia, anemia   Anesthesia Other Findings Lymphocytic colitis  Hypertension Ulcerative colitis (HCC)  Varicose veins DVT (deep venous thrombosis) (HCC) GERD (gastroesophageal reflux disease) Arthritis  Anemia Breast cancer (HCC)  Breast cancer (HCC) Personal history of radiation therapy  Acute otalgia, left COVID-19  CKD (chronic kidney disease), stage III (HCC) Wears dentures      Reproductive/Obstetrics negative OB ROS                               Anesthesia Physical Anesthesia Plan  ASA: 3  Anesthesia Plan: MAC   Post-op Pain Management: Minimal or no pain anticipated   Induction: Intravenous  PONV Risk Score and Plan:   Airway Management Planned: Natural Airway  Additional Equipment:   Intra-op Plan:   Post-operative Plan:   Informed Consent: I have reviewed the patients History and Physical, chart, labs and discussed the procedure including the risks, benefits and alternatives for the proposed anesthesia with the patient or authorized representative who has indicated his/her understanding and acceptance.       Plan Discussed with: Anesthesiologist, CRNA and Surgeon  Anesthesia Plan Comments:          Anesthesia Quick Evaluation

## 2023-06-08 NOTE — Progress Notes (Signed)
 Subjective:   Mia Perez is a 89 y.o. who presents for a Medicare Wellness preventive visit.  Visit Complete: Virtual I connected with  Mia Perez on 06/08/23 by a audio enabled telemedicine application and verified that I am speaking with the correct person using two identifiers.  Patient Location: Home  Provider Location: Office/Clinic  I discussed the limitations of evaluation and management by telemedicine. The patient expressed understanding and agreed to proceed.  Vital Signs: Because this visit was a virtual/telehealth visit, some criteria may be missing or patient reported. Any vitals not documented were not able to be obtained and vitals that have been documented are patient reported.  VideoDeclined- This patient declined Librarian, academic. Therefore the visit was completed with audio only.  Persons Participating in Visit: Patient.  AWV Questionnaire: No: Patient Medicare AWV questionnaire was not completed prior to this visit.  Cardiac Risk Factors include: advanced age (>50men, >60 women);dyslipidemia;hypertension     Objective:    Today's Vitals   06/08/23 1422  BP: 125/67  Weight: 133 lb (60.3 kg)  Height: 5\' 7"  (1.702 m)   Body mass index is 20.83 kg/m.     06/08/2023    2:44 PM 01/20/2023    9:09 AM 06/05/2022   11:01 AM 04/11/2020    1:47 PM 11/30/2019   11:48 AM 01/23/2019    9:52 AM 11/15/2018   12:10 PM  Advanced Directives  Does Patient Have a Medical Advance Directive? Yes Yes Yes Yes Yes No No  Type of Estate agent of [place];Living will Healthcare Power of [place];Living will Healthcare Power of [place];Living will  Healthcare Power of [place];Living will    Does patient want to make changes to medical advance directive?  No - Patient declined No - Patient declined No - Patient declined No - Patient declined    Copy of Healthcare Power of Attorney in Chart? No - copy requested  No -  copy requested  No - copy requested    Would patient like information on creating a medical advance directive?      No - Patient declined No - Patient declined    Current Medications (verified) Outpatient Encounter Medications as of 06/08/2023  Medication Sig   amLODipine  (NORVASC ) 2.5 MG tablet TAKE 1 TABLET BY MOUTH DAILY IF BP IS >140/90 2.5 MG   amLODipine  (NORVASC ) 5 MG tablet Take 1 tablet (5 mg total) by mouth daily.   aspirin 325 MG tablet Take 325 mg by mouth daily.   budesonide  (ENTOCORT EC ) 3 MG 24 hr capsule TAKE 3 CAPSULES (9 MG TOTAL) BY MOUTH DAILY.   cholecalciferol (VITAMIN D ) 25 MCG (1000 UNIT) tablet Take 1,000 Units by mouth daily.   Cyanocobalamin (VITAMIN B-12) 5000 MCG TBDP Take 500 mcg by mouth daily.   dorzolamide (TRUSOPT) 2 % ophthalmic solution 1 drop 2 (two) times daily.   dorzolamidel-timolol (COSOPT) 22.3-6.8 MG/ML SOLN ophthalmic solution Place 1 drop into both eyes 2 (two) times daily. Take one drop in both eyes in the morning & evening before bed.   ezetimibe  (ZETIA ) 10 MG tablet Take 1 tablet (10 mg total) by mouth daily.   fluticasone  (FLONASE ) 50 MCG/ACT nasal spray Place 2 sprays into both nostrils daily.   hydrochlorothiazide  (MICROZIDE ) 12.5 MG capsule Take 1 capsule (12.5 mg total) by mouth daily as needed. Take for blood pressure > 145/80   losartan  (COZAAR ) 100 MG tablet TAKE 1 TABLET BY MOUTH EVERY DAY IN THE EVENING (100  MG)   LUMIGAN 0.01 % SOLN Place 1 drop into both eyes at bedtime.    metoprolol  succinate (TOPROL -XL) 100 MG 24 hr tablet TAKE 1 TABLET BY MOUTH WITH OR IMMEDIATELY FOLLOWING A MEAL.   Multiple Vitamins-Minerals (PRESERVISION AREDS 2 PO) Take 1 tablet by mouth 2 (two) times daily.    simethicone (MYLICON) 125 MG chewable tablet Chew 125 mg by mouth every morning.   hydrALAZINE  (APRESOLINE ) 10 MG tablet TAKE 1 TABLET BY MOUTH THREE TIMES A DAY (Patient not taking: Reported on 06/08/2023)   ondansetron  (ZOFRAN ) 4 MG tablet Take 1  tablet (4 mg total) by mouth every 8 (eight) hours as needed for nausea or vomiting. (Patient not taking: Reported on 06/08/2023)   No facility-administered encounter medications on file as of 06/08/2023.    Allergies (verified) Pravachol  [pravastatin ] and Penicillins   History: Past Medical History:  Diagnosis Date   Acute otalgia, left 08/06/2020   Anemia    H/O   Arthritis    Breast cancer (HCC) 02/10/1996   left, lumpectomy and radiation   Breast cancer (HCC) 2017   right   CKD (chronic kidney disease), stage III (HCC)    COVID-19 03/30/2023   DVT (deep venous thrombosis) (HCC) 11/2014   Right leg   GERD (gastroesophageal reflux disease)    Hypertension    Lymphocytic colitis    Personal history of radiation therapy 1998, 2017   BREAST CA   Ulcerative colitis (HCC) 2014   Varicose veins    Wears dentures    Full upper, partial lower   Past Surgical History:  Procedure Laterality Date   BREAST EXCISIONAL BIOPSY Right 2014   NEG   BREAST LUMPECTOMY Left 1998   radiation   BREAST LUMPECTOMY Right 2017   BREAST LUMPECTOMY WITH SENTINEL LYMPH NODE BIOPSY Right 12/30/2015   8 mm, T1b, N0; RUOQ, ER 95%; PR: 80 %, Her 2 neu not overexpressed.Mammosite Radiation.   BREAST SURGERY Right 2014   Microcalcifications excised with wire localization/ Dr Marquita Situ   CATARACT EXTRACTION W/ INTRAOCULAR LENS IMPLANT Left    approx 2010   COLONOSCOPY  2012   Dr Andriette Keeling   DILATION AND CURETTAGE OF UTERUS     HERNIA REPAIR     HYSTEROSCOPY     MELANOMA EXCISION     middle of chest between breasts, Dr Lehman Pummel   NECK SURGERY     PERIPHERAL VASCULAR CATHETERIZATION N/A 12/13/2014   Procedure: IVC Filter Insertion;  Surgeon: Celso College, MD;  Location: [facility] INVASIVE CV LAB;  Service: Cardiovascular;  Laterality: N/A;   PERIPHERAL VASCULAR CATHETERIZATION  12/13/2014   Procedure: Lower Extremity Intervention;  Surgeon: Celso College, MD;  Location: [facility] INVASIVE CV LAB;  Service:  Cardiovascular;;   PERIPHERAL VASCULAR CATHETERIZATION N/A 02/20/2015   Procedure: IVC Filter Removal;  Surgeon: Celso College, MD;  Location: [facility] INVASIVE CV LAB;  Service: Cardiovascular;  Laterality: N/A;   Family History  Problem Relation Age of Onset   Heart disease Mother    Hypertension Mother    Ovarian cancer Mother 66   Hypertension Sister    Hypertension Brother    Ovarian cancer Paternal Aunt 58   Breast cancer Neg Hx    Social History   Socioeconomic History   Marital status: Widowed    Spouse name: Not on file   Number of children: 1   Years of education: Not on file   Highest education level: Not on file  Occupational History  Occupation: Retired   Tobacco Use   Smoking status: Never    Passive exposure: Never   Smokeless tobacco: Never  Vaping Use   Vaping status: Never Used  Substance and Sexual Activity   Alcohol use: No   Drug use: No   Sexual activity: Never  Other Topics Concern   Not on file  Social History Narrative   Lives in [place]. Husband passed away 55. Sherline Distel, daughter.      Work - [employer] 13 years, then [employer]      Exercise - Curves      Diet - regular.      Social Drivers of Corporate investment banker Strain: Low Risk  (06/08/2023)   Overall Financial Resource Strain (CARDIA)    Difficulty of Paying Living Expenses: Not hard at all  Food Insecurity: No Food Insecurity (06/08/2023)   Hunger Vital Sign    Worried About Running Out of Food in the Last Year: Never true    Ran Out of Food in the Last Year: Never true  Transportation Needs: No Transportation Needs (06/08/2023)   PRAPARE - Administrator, Civil Service (Medical): No    Lack of Transportation (Non-Medical): No  Physical Activity: Inactive (06/08/2023)   Exercise Vital Sign    Days of Exercise per Week: 0 days    Minutes of Exercise per Session: 0 min  Stress: No Stress Concern Present (06/08/2023)   [employer] of Occupational  Health - Occupational Stress Questionnaire    Feeling of Stress : Only a little  Social Connections: Moderately Isolated (06/08/2023)   Social Connection and Isolation Panel [NHANES]    Frequency of Communication with Friends and Family: More than three times a week    Frequency of Social Gatherings with Friends and Family: Twice a week    Attends Religious Services: Never    Database administrator or Organizations: Yes    Attends Engineer, structural: More than 4 times per year    Marital Status: Widowed    Tobacco Counseling Counseling given: Not Answered    Clinical Intake:  Pre-visit preparation completed: Yes  Pain : No/denies pain     BMI - recorded: 20.83 Nutritional Status: BMI of 19-24  Normal Nutritional Risks: None Diabetes: No  Lab Results  Component Value Date   HGBA1C 5.8 (A) 02/24/2023   HGBA1C 6.4 11/19/2022   HGBA1C 5.8 (A) 02/25/2022     How often do you need to have someone help you when you read instructions, pamphlets, or other written materials from your doctor or pharmacy?: 1 - Never  Interpreter Needed?: No  Information entered by :: R. Mitul Hallowell LPN   Activities of Daily Living     06/08/2023    2:26 PM  In your present state of health, do you have any difficulty performing the following activities:  Hearing? 1  Vision? 0  Difficulty concentrating or making decisions? 0  Walking or climbing stairs? 1  Dressing or bathing? 0  Doing errands, shopping? 1  Preparing Food and eating ? N  Using the Toilet? N  In the past six months, have you accidently leaked urine? N  Do you have problems with loss of bowel control? Y  Managing your Medications? N  Managing your Finances? N  Housekeeping or managing your Housekeeping? N    Patient Care Team: Calista Catching, FNP as PCP - General (Family Medicine) Marquita Situ, Magali Schmitz, MD (General Surgery) Marquita Situ Magali Schmitz, MD (  General Surgery) Cook, Jayce G, DO as Consulting Physician  (Family Medicine)  Indicate any recent Medical Services you may have received from other than [facility] providers in the past year (date may be approximate).     Assessment:   This is a routine wellness examination for Garrett.  Hearing/Vision screen Hearing Screening - Comments:: Some issues Vision Screening - Comments:: glasses   Goals Addressed             This Visit's Progress    Patient Stated       Wants to move to [facility] soon       Depression Screen     06/08/2023    2:38 PM 05/12/2023   11:04 AM 05/05/2023   11:48 AM 04/12/2023   12:32 PM 02/24/2023   11:07 AM 01/20/2023    9:09 AM 11/25/2022   12:52 PM  PHQ 2/9 Scores  PHQ - 2 Score 0 0 0 0 0 0 0  PHQ- 9 Score 0          Fall Risk     06/08/2023    2:29 PM 05/12/2023   11:04 AM 05/05/2023   11:48 AM 04/12/2023   12:32 PM 02/24/2023   11:07 AM  Fall Risk   Falls in the past year? 1 0 0 0 0  Number falls in past yr: 0 0 0 0 0  Injury with Fall? 0 0 0 0 0  Risk for fall due to : History of fall(s);Impaired balance/gait No Fall Risks No Fall Risks No Fall Risks No Fall Risks  Follow up Falls evaluation completed;Falls prevention discussed Falls evaluation completed Falls evaluation completed Falls evaluation completed Falls evaluation completed    MEDICARE RISK AT HOME:  Medicare Risk at Home Any stairs in or around the home?: Yes If so, are there any without handrails?: No Home free of loose throw rugs in walkways, pet beds, electrical cords, etc?: Yes Adequate lighting in your home to reduce risk of falls?: Yes Life alert?: Yes Use of a cane, walker or w/c?: Yes (when going out) Grab bars in the bathroom?: No Shower chair or bench in shower?: No Elevated toilet seat or a handicapped toilet?: Yes  TIMED UP AND GO:  Was the test performed?  No  Cognitive Function: 6CIT completed    01/30/2015    1:35 PM  MMSE - Mini Mental State Exam  Orientation to time 5  Orientation to Place 5  Registration 3   Attention/ Calculation 5  Recall 3  Language- name 2 objects 2  Language- repeat 1  Language- follow 3 step command 3  Language- read & follow direction 1  Write a sentence 1  Copy design 1  Total score 30        06/08/2023    2:46 PM 06/05/2022   11:07 AM 11/30/2019   11:56 AM 11/15/2018   12:11 PM 01/30/2016    1:51 PM  6CIT Screen  What Year? 0 points 0 points 0 points 0 points 0 points  What month? 0 points 0 points 0 points 0 points 0 points  What time? 0 points 0 points 0 points 0 points 0 points  Count back from 20 0 points 0 points  0 points 0 points  Months in reverse 0 points 0 points 0 points 0 points 0 points  Repeat phrase 0 points 0 points  0 points   Total Score 0 points 0 points  0 points     Immunizations Immunization  History  Administered Date(s) Administered   Fluad Quad(high Dose 65+) 10/10/2018, 11/18/2020, 10/24/2021   Fluad Trivalent(High Dose 65+) 11/05/2022   Influenza Split 02/23/2012, 10/19/2015   Influenza, High Dose Seasonal PF 10/06/2017, 10/10/2018, 10/17/2019   Influenza,inj,Quad PF,6+ Mos 03/06/2013, 12/12/2013, 10/16/2014   Influenza-Unspecified 02/23/2012, 03/06/2013, 12/12/2013, 10/16/2014, 10/19/2015, 10/13/2016   PFIZER(Purple Top)SARS-COV-2 Vaccination 02/24/2019, 03/17/2019   Pneumococcal Conjugate-13 03/06/2013   Pneumococcal Polysaccharide-23 08/05/2010   Tdap 08/04/2009    Screening Tests Health Maintenance  Topic Date Due   Zoster Vaccines- Shingrix (1 of 2) Never done   Medicare Annual Wellness (AWV)  06/05/2023   COVID-19 Vaccine (3 - Pfizer risk series) 11/05/2023 (Originally 04/14/2019)   DTaP/Tdap/Td (2 - Td or Tdap) 02/24/2024 (Originally 08/05/2019)   INFLUENZA VACCINE  09/10/2023   Pneumonia Vaccine 35+ Years old  Completed   DEXA SCAN  Completed   HPV VACCINES  Aged Out   Meningococcal B Vaccine  Aged Out    Health Maintenance  Health Maintenance Due  Topic Date Due   Zoster Vaccines- Shingrix (1 of 2) Never  done   Medicare Annual Wellness (AWV)  06/05/2023   Health Maintenance Items Addressed: Discussed the need to update shingles and tetanus vaccines.  Additional Screening:  Vision Screening: Recommended annual ophthalmology exams for early detection of glaucoma and other disorders of the eye.Up to date [facility]  Dental Screening: Recommended annual dental exams for proper oral hygiene  Community Resource Referral / Chronic Care Management: CRR required this visit?  No   CCM required this visit?  No     Plan:     I have personally reviewed and noted the following in the patient's chart:   Medical and social history Use of alcohol, tobacco or illicit drugs  Current medications and supplements including opioid prescriptions. Patient is not currently taking opioid prescriptions. Functional ability and status Nutritional status Physical activity Advanced directives List of other physicians Hospitalizations, surgeries, and ER visits in previous 12 months Vitals Screenings to include cognitive, depression, and falls Referrals and appointments  In addition, I have reviewed and discussed with patient certain preventive protocols, quality metrics, and best practice recommendations. A written personalized care plan for preventive services as well as general preventive health recommendations were provided to patient.     Felicitas Horse, LPN   1/61/0960   After Visit Summary: (Declined) Due to this being a telephonic visit, with patients personalized plan was offered to patient but patient Declined AVS at this time   Notes: Nothing significant to report at this time.

## 2023-06-08 NOTE — Patient Instructions (Signed)
 Mia Perez , Thank you for taking time to come for your Medicare Wellness Visit. I appreciate your ongoing commitment to your health goals. Please review the following plan we discussed and let me know if I can assist you in the future.   Referrals/Orders/Follow-Ups/Clinician Recommendations: Consider updating your shingles and tetanus vaccines.  This is a list of the screening recommended for you and due dates:  Health Maintenance  Topic Date Due   Zoster (Shingles) Vaccine (1 of 2) Never done   COVID-19 Vaccine (3 - Pfizer risk series) 11/05/2023*   DTaP/Tdap/Td vaccine (2 - Td or Tdap) 02/24/2024*   Flu Shot  09/10/2023   Mammogram  03/01/2024   Medicare Annual Wellness Visit  06/07/2024   Pneumonia Vaccine  Completed   DEXA scan (bone density measurement)  Completed   HPV Vaccine  Aged Out   Meningitis B Vaccine  Aged Out  *Topic was postponed. The date shown is not the original due date.    Advanced directives: (Copy Requested) Please bring a copy of your health care power of attorney and living will to the office to be added to your chart at your convenience. You can mail to [facility] [address] [place], [place] [address] or email to [email or web address]  Next Medicare Annual Wellness Visit scheduled for next year: Yes 06/13/24 @ 10:50

## 2023-06-10 NOTE — Discharge Instructions (Signed)

## 2023-06-11 ENCOUNTER — Ambulatory Visit: Admitting: Surgery

## 2023-06-14 ENCOUNTER — Other Ambulatory Visit: Payer: Self-pay

## 2023-06-14 ENCOUNTER — Ambulatory Visit
Admission: RE | Admit: 2023-06-14 | Discharge: 2023-06-14 | Disposition: A | Discharge status: Home / Self Care | Payer: Medicare PPO | Attending: Ophthalmology | Admitting: Ophthalmology

## 2023-06-14 ENCOUNTER — Encounter: Payer: Self-pay | Admitting: Ophthalmology

## 2023-06-14 ENCOUNTER — Encounter
Admission: RE | Disposition: A | Discharge status: Home / Self Care | Payer: Self-pay | Source: Home / Self Care | Attending: Ophthalmology

## 2023-06-14 ENCOUNTER — Ambulatory Visit: Payer: Self-pay | Admitting: Anesthesiology

## 2023-06-14 DIAGNOSIS — N183 Chronic kidney disease, stage 3 unspecified: Secondary | ICD-10-CM | POA: Diagnosis not present

## 2023-06-14 DIAGNOSIS — K219 Gastro-esophageal reflux disease without esophagitis: Secondary | ICD-10-CM | POA: Diagnosis not present

## 2023-06-14 DIAGNOSIS — I129 Hypertensive chronic kidney disease with stage 1 through stage 4 chronic kidney disease, or unspecified chronic kidney disease: Secondary | ICD-10-CM | POA: Insufficient documentation

## 2023-06-14 DIAGNOSIS — I272 Pulmonary hypertension, unspecified: Secondary | ICD-10-CM | POA: Insufficient documentation

## 2023-06-14 DIAGNOSIS — H2511 Age-related nuclear cataract, right eye: Secondary | ICD-10-CM | POA: Insufficient documentation

## 2023-06-14 DIAGNOSIS — D631 Anemia in chronic kidney disease: Secondary | ICD-10-CM | POA: Diagnosis not present

## 2023-06-14 DIAGNOSIS — H25041 Posterior subcapsular polar age-related cataract, right eye: Secondary | ICD-10-CM | POA: Diagnosis not present

## 2023-06-14 HISTORY — DX: Chronic kidney disease, stage 3 unspecified: N18.30

## 2023-06-14 HISTORY — DX: Presence of dental prosthetic device (complete) (partial): Z97.2

## 2023-06-14 HISTORY — PX: CATARACT EXTRACTION W/PHACO: SHX586

## 2023-06-14 SURGERY — PHACOEMULSIFICATION, CATARACT, WITH IOL INSERTION
Anesthesia: Monitor Anesthesia Care | Laterality: Right

## 2023-06-14 MED ORDER — ARMC OPHTHALMIC DILATING DROPS
1.0000 | OPHTHALMIC | Status: DC | PRN
  Administered 2023-06-14 (×3): 1 via OPHTHALMIC

## 2023-06-14 MED ORDER — TETRACAINE HCL 0.5 % OP SOLN
OPHTHALMIC | Status: AC
  Filled 2023-06-14: qty 4

## 2023-06-14 MED ORDER — ARMC OPHTHALMIC DILATING DROPS
OPHTHALMIC | Status: AC
Start: 2023-06-14 — End: ?
  Filled 2023-06-14: qty 0.5

## 2023-06-14 MED ORDER — FENTANYL CITRATE (PF) 100 MCG/2ML IJ SOLN
INTRAMUSCULAR | Status: AC
  Filled 2023-06-14: qty 2

## 2023-06-14 MED ORDER — TETRACAINE HCL 0.5 % OP SOLN
1.0000 [drp] | OPHTHALMIC | Status: DC | PRN
  Administered 2023-06-14 (×3): 1 [drp] via OPHTHALMIC

## 2023-06-14 MED ORDER — SIGHTPATH DOSE#1 BSS IO SOLN
INTRAOCULAR | Status: DC | PRN
  Administered 2023-06-14: 15 mL via INTRAOCULAR

## 2023-06-14 MED ORDER — SIGHTPATH DOSE#1 BSS IO SOLN
INTRAOCULAR | Status: DC | PRN
  Administered 2023-06-14: 107 mL via OPHTHALMIC

## 2023-06-14 MED ORDER — MOXIFLOXACIN HCL 0.5 % OP SOLN
OPHTHALMIC | Status: DC | PRN
  Administered 2023-06-14: .2 mL via OPHTHALMIC

## 2023-06-14 MED ORDER — SIGHTPATH DOSE#1 NA HYALUR & NA CHOND-NA HYALUR IO KIT
PACK | INTRAOCULAR | Status: DC | PRN
Start: 2023-06-14 — End: 2023-06-14
  Administered 2023-06-14: 1 via OPHTHALMIC

## 2023-06-14 MED ORDER — LIDOCAINE HCL (PF) 2 % IJ SOLN
INTRAOCULAR | Status: DC | PRN
  Administered 2023-06-14: 1 mL via INTRAOCULAR

## 2023-06-14 MED ORDER — FENTANYL CITRATE (PF) 100 MCG/2ML IJ SOLN
INTRAMUSCULAR | Status: DC | PRN
  Administered 2023-06-14: 50 ug via INTRAVENOUS

## 2023-06-14 SURGICAL SUPPLY — 12 items
CATARACT SUITE SIGHTPATH (MISCELLANEOUS) ×1 IMPLANT
DISSECTOR HYDRO NUCLEUS 50X22 (MISCELLANEOUS) ×1 IMPLANT
FEE CATARACT SUITE SIGHTPATH (MISCELLANEOUS) ×1 IMPLANT
GLOVE PI ULTRA LF STRL 7.5 (GLOVE) ×1 IMPLANT
GLOVE SURG POLYISOPRENE 8.5 (GLOVE) ×1 IMPLANT
GLOVE SURG PROTEXIS BL SZ6.5 (GLOVE) ×1 IMPLANT
GLOVE SURG SYN 6.5 PF PI BL (GLOVE) ×1 IMPLANT
GLOVE SURG SYN 8.5 PF PI BL (GLOVE) ×1 IMPLANT
LENS IOL TECNIS EYHANCE 18.5 (Intraocular Lens) IMPLANT
NDL FILTER BLUNT 18X1 1/2 (NEEDLE) ×1 IMPLANT
NEEDLE FILTER BLUNT 18X1 1/2 (NEEDLE) ×1 IMPLANT
SYR 3ML LL SCALE MARK (SYRINGE) ×1 IMPLANT

## 2023-06-14 NOTE — H&P (Signed)
 [facility]   Primary Care Physician:  Calista Catching, FNP Ophthalmologist: Dr. Dusty Gin  Pre-Procedure History & Physical: HPI:  Mia Perez is a 89 y.o. female here for cataract surgery.   Past Medical History:  Diagnosis Date   Acute otalgia, left 08/06/2020   Anemia    H/O   Arthritis    Breast cancer (HCC) 02/10/1996   left, lumpectomy and radiation   Breast cancer (HCC) 2017   right   CKD (chronic kidney disease), stage III (HCC)    COVID-19 03/30/2023   DVT (deep venous thrombosis) (HCC) 11/2014   Right leg   GERD (gastroesophageal reflux disease)    Hypertension    Lymphocytic colitis    Personal history of radiation therapy 1998, 2017   BREAST CA   Ulcerative colitis (HCC) 2014   Varicose veins    Wears dentures    Full upper, partial lower    Past Surgical History:  Procedure Laterality Date   BREAST EXCISIONAL BIOPSY Right 2014   NEG   BREAST LUMPECTOMY Left 1998   radiation   BREAST LUMPECTOMY Right 2017   BREAST LUMPECTOMY WITH SENTINEL LYMPH NODE BIOPSY Right 12/30/2015   8 mm, T1b, N0; RUOQ, ER 95%; PR: 80 %, Her 2 neu not overexpressed.Mammosite Radiation.   BREAST SURGERY Right 2014   Microcalcifications excised with wire localization/ Dr Marquita Situ   CATARACT EXTRACTION W/ INTRAOCULAR LENS IMPLANT Left    approx 2010   COLONOSCOPY  2012   Dr Andriette Keeling   DILATION AND CURETTAGE OF UTERUS     HERNIA REPAIR     HYSTEROSCOPY     MELANOMA EXCISION     middle of chest between breasts, Dr Lehman Pummel   NECK SURGERY     PERIPHERAL VASCULAR CATHETERIZATION N/A 12/13/2014   Procedure: IVC Filter Insertion;  Surgeon: Celso College, MD;  Location: [facility] INVASIVE CV LAB;  Service: Cardiovascular;  Laterality: N/A;   PERIPHERAL VASCULAR CATHETERIZATION  12/13/2014   Procedure: Lower Extremity Intervention;  Surgeon: Celso College, MD;  Location: [facility] INVASIVE CV LAB;  Service: Cardiovascular;;   PERIPHERAL VASCULAR CATHETERIZATION N/A 02/20/2015    Procedure: IVC Filter Removal;  Surgeon: Celso College, MD;  Location: [facility] INVASIVE CV LAB;  Service: Cardiovascular;  Laterality: N/A;    Prior to Admission medications   Medication Sig Start Date End Date Taking? Authorizing Provider  amLODipine  (NORVASC ) 2.5 MG tablet TAKE 1 TABLET BY MOUTH DAILY IF BP IS >140/90 2.5 MG 02/24/23  Yes Arnett, Hanley Lew, FNP  amLODipine  (NORVASC ) 5 MG tablet Take 1 tablet (5 mg total) by mouth daily. 05/05/23  Yes Calista Catching, FNP  aspirin 325 MG tablet Take 325 mg by mouth daily.   Yes [provider]  budesonide  (ENTOCORT EC ) 3 MG 24 hr capsule TAKE 3 CAPSULES (9 MG TOTAL) BY MOUTH DAILY. 09/22/21  Yes Vanga, Elson Halon, MD  cholecalciferol (VITAMIN D ) 25 MCG (1000 UNIT) tablet Take 1,000 Units by mouth daily.   Yes [provider]  Cyanocobalamin (VITAMIN B-12) 5000 MCG TBDP Take 500 mcg by mouth daily.   Yes [provider]  dorzolamide (TRUSOPT) 2 % ophthalmic solution 1 drop 2 (two) times daily. 05/22/22  Yes [provider]  dorzolamidel-timolol (COSOPT) 22.3-6.8 MG/ML SOLN ophthalmic solution Place 1 drop into both eyes 2 (two) times daily. Take one drop in both eyes in the morning & evening before bed.   Yes [provider]  ezetimibe  (ZETIA ) 10  MG tablet Take 1 tablet (10 mg total) by mouth daily. 11/05/22  Yes Arnett, Hanley Lew, FNP  fluticasone  (FLONASE ) 50 MCG/ACT nasal spray Place 2 sprays into both nostrils daily. 11/05/22  Yes Arnett, Hanley Lew, FNP  hydrochlorothiazide  (MICROZIDE ) 12.5 MG capsule Take 1 capsule (12.5 mg total) by mouth daily as needed. Take for blood pressure > 145/80 05/05/23  Yes Arnett, Hanley Lew, FNP  losartan  (COZAAR ) 100 MG tablet TAKE 1 TABLET BY MOUTH EVERY DAY IN THE EVENING (100 MG) 05/05/23  Yes Arnett, Hanley Lew, FNP  LUMIGAN 0.01 % SOLN Place 1 drop into both eyes at bedtime.  12/09/15  Yes [provider]  metoprolol  succinate (TOPROL -XL) 100 MG 24 hr tablet  TAKE 1 TABLET BY MOUTH WITH OR IMMEDIATELY FOLLOWING A MEAL. 03/26/23  Yes Arnett, Hanley Lew, FNP  Multiple Vitamins-Minerals (PRESERVISION AREDS 2 PO) Take 1 tablet by mouth 2 (two) times daily.    Yes [provider]  simethicone (MYLICON) 125 MG chewable tablet Chew 125 mg by mouth every morning.   Yes [provider]  hydrALAZINE  (APRESOLINE ) 10 MG tablet TAKE 1 TABLET BY MOUTH THREE TIMES A DAY Patient not taking: Reported on 06/08/2023 05/22/23   Webb, Padonda B, FNP  ondansetron  (ZOFRAN ) 4 MG tablet Take 1 tablet (4 mg total) by mouth every 8 (eight) hours as needed for nausea or vomiting. Patient not taking: Reported on 06/08/2023 05/24/23   Emmalene Hare, MD    Allergies as of 03/08/2023 - Review Complete 02/24/2023  Allergen Reaction Noted   Pravachol  [pravastatin ]  02/22/2019   Penicillins Rash 02/04/2011    Family History  Problem Relation Age of Onset   Heart disease Mother    Hypertension Mother    Ovarian cancer Mother 15   Hypertension Sister    Hypertension Brother    Ovarian cancer Paternal Aunt 66   Breast cancer Neg Hx     Social History   Socioeconomic History   Marital status: Widowed    Spouse name: Not on file   Number of children: 1   Years of education: Not on file   Highest education level: Not on file  Occupational History   Occupation: Retired   Tobacco Use   Smoking status: Never    Passive exposure: Never   Smokeless tobacco: Never  Vaping Use   Vaping status: Never Used  Substance and Sexual Activity   Alcohol use: No   Drug use: No   Sexual activity: Never  Other Topics Concern   Not on file  Social History Narrative   Lives in [place]. Husband passed away 54. Sherline Distel, daughter.      Work - [employer] 13 years, then [employer]      Exercise - Curves      Diet - regular.      Social Drivers of Corporate investment banker Strain: Low Risk  (06/08/2023)   Overall Financial Resource Strain (CARDIA)     Difficulty of Paying Living Expenses: Not hard at all  Food Insecurity: No Food Insecurity (06/08/2023)   Hunger Vital Sign    Worried About Running Out of Food in the Last Year: Never true    Ran Out of Food in the Last Year: Never true  Transportation Needs: No Transportation Needs (06/08/2023)   PRAPARE - Administrator, Civil Service (Medical): No    Lack of Transportation (Non-Medical): No  Physical Activity: Inactive (06/08/2023)   Exercise Vital Sign  Days of Exercise per Week: 0 days    Minutes of Exercise per Session: 0 min  Stress: No Stress Concern Present (06/08/2023)   [employer] of Occupational Health - Occupational Stress Questionnaire    Feeling of Stress : Only a little  Social Connections: Moderately Isolated (06/08/2023)   Social Connection and Isolation Panel [NHANES]    Frequency of Communication with Friends and Family: More than three times a week    Frequency of Social Gatherings with Friends and Family: Twice a week    Attends Religious Services: Never    Database administrator or Organizations: Yes    Attends Engineer, structural: More than 4 times per year    Marital Status: Widowed  Intimate Partner Violence: Not At Risk (06/08/2023)   Humiliation, Afraid, Rape, and Kick questionnaire    Fear of Current or Ex-Partner: No    Emotionally Abused: No    Physically Abused: No    Sexually Abused: No    Review of Systems: See HPI, otherwise negative ROS  Physical Exam: BP (!) 163/81   Temp (!) 97.2 F (36.2 C) (Temporal)   Resp 13   Ht 5' 7.01" (1.702 m)   Wt 60.6 kg   SpO2 97%   BMI 20.94 kg/m  General:   Alert, cooperative. Head:  Normocephalic and atraumatic. Respiratory:  Normal work of breathing. Cardiovascular:  NAD  Impression/Plan: Mia Perez is here for cataract surgery.  Risks, benefits, limitations, and alternatives regarding cataract surgery have been reviewed with the patient.  Questions have been  answered.  All parties agreeable.   Dusty Gin, MD  06/14/2023, 7:53 AM

## 2023-06-14 NOTE — Transfer of Care (Signed)
 Immediate Anesthesia Transfer of Care Note  Patient: Mia Perez  Procedure(s) Performed: CATARACT EXTRACTION PHACO AND INTRAOCULAR LENS PLACEMENT (IOC) RIGHT 7.92, 00:47.5 (Right)  Patient Location: PACU  Anesthesia Type: MAC  Level of Consciousness: awake, alert  and patient cooperative  Airway and Oxygen Therapy: Patient Spontanous Breathing and Patient connected to supplemental oxygen  Post-op Assessment: Post-op Vital signs reviewed, Patient's Cardiovascular Status Stable, Respiratory Function Stable, Patent Airway and No signs of Nausea or vomiting  Post-op Vital Signs: Reviewed and stable  Complications: No notable events documented.

## 2023-06-14 NOTE — Op Note (Signed)
 OPERATIVE NOTE  Mia Perez [id number] 06/14/2023   PREOPERATIVE DIAGNOSIS:  Nuclear sclerotic cataract right eye.  H25.11   POSTOPERATIVE DIAGNOSIS:    Nuclear sclerotic cataract right eye.     PROCEDURE:  Phacoemusification with posterior chamber intraocular lens placement of the right eye   LENS:   Implant Name Type Inv. Item Serial No. Manufacturer Lot No. LRB No. Used Action  LENS IOL TECNIS EYHANCE 18.5 - [id number] Intraocular Lens LENS IOL TECNIS EYHANCE 18.5 [id number] [employer]  Right 1 Implanted       Procedure(s): CATARACT EXTRACTION PHACO AND INTRAOCULAR LENS PLACEMENT (IOC) RIGHT 7.92, 00:47.5 (Right)  SURGEON:  Dusty Gin, MD, MPH  ANESTHESIOLOGIST: Anesthesiologist: Baltazar Bonier, MD CRNA: Adrien Horner, CRNA   ANESTHESIA:  Topical with tetracaine drops augmented with 1% preservative-free intracameral lidocaine .  ESTIMATED BLOOD LOSS: less than 1 mL.   COMPLICATIONS:  None.   DESCRIPTION OF PROCEDURE:  The patient was identified in the holding room and transported to the operating room and placed in the supine position under the operating microscope.  The right eye was identified as the operative eye and it was prepped and draped in the usual sterile ophthalmic fashion.   A 1.0 millimeter clear-corneal paracentesis was made at the 10:30 position. 0.5 ml of preservative-free 1% lidocaine  with epinephrine  was injected into the anterior chamber.  The anterior chamber was filled with viscoelastic.  A 2.4 millimeter keratome was used to make a near-clear corneal incision at the 8:00 position.  A curvilinear capsulorrhexis was made with a cystotome and capsulorrhexis forceps.  Balanced salt solution was used to hydrodissect and hydrodelineate the nucleus.   Phacoemulsification was then used in stop and chop fashion to remove the lens nucleus and epinucleus.  The remaining cortex was then removed using the irrigation and aspiration handpiece. Viscoelastic was  then placed into the capsular bag to distend it for lens placement.  A lens was then injected into the capsular bag.  The remaining viscoelastic was aspirated.   Wounds were hydrated with balanced salt solution.  The anterior chamber was inflated to a physiologic pressure with balanced salt solution.   Intracameral vigamox 0.1 mL undiluted was injected into the eye and a drop placed onto the ocular surface.  No wound leaks were noted.  The patient was taken to the recovery room in stable condition without complications of anesthesia or surgery  Dusty Gin 06/14/2023, 8:26 AM

## 2023-06-14 NOTE — Anesthesia Postprocedure Evaluation (Signed)
 Anesthesia Post Note  Patient: MICHELYN SWINT  Procedure(s) Performed: CATARACT EXTRACTION PHACO AND INTRAOCULAR LENS PLACEMENT (IOC) RIGHT 7.92, 00:47.5 (Right)  Patient location during evaluation: PACU Anesthesia Type: MAC Level of consciousness: awake and alert Pain management: pain level controlled Vital Signs Assessment: post-procedure vital signs reviewed and stable Respiratory status: spontaneous breathing, nonlabored ventilation and respiratory function stable Cardiovascular status: stable and blood pressure returned to baseline Postop Assessment: no apparent nausea or vomiting Anesthetic complications: no   No notable events documented.   Last Vitals:  Vitals:   06/14/23 0827 06/14/23 0833  BP: 118/66 107/83  Pulse: (!) 59 (!) 56  Resp: 12 15  Temp: (!) 36.4 C (!) 36.4 C  SpO2: 97% 95%    Last Pain:  Vitals:   06/14/23 0833  TempSrc:   PainSc: 0-No pain                 Baltazar Bonier

## 2023-06-16 ENCOUNTER — Ambulatory Visit: Admitting: Family

## 2023-06-18 ENCOUNTER — Ambulatory Visit: Admitting: Surgery

## 2023-06-28 ENCOUNTER — Encounter: Payer: Self-pay | Admitting: Family

## 2023-06-28 ENCOUNTER — Ambulatory Visit: Payer: Self-pay | Admitting: Family

## 2023-06-28 ENCOUNTER — Encounter: Payer: Self-pay | Admitting: Surgery

## 2023-06-28 ENCOUNTER — Ambulatory Visit: Admitting: Surgery

## 2023-06-28 VITALS — BP 135/75 | HR 62 | Ht 67.0 in | Wt 137.8 lb

## 2023-06-28 DIAGNOSIS — D649 Anemia, unspecified: Secondary | ICD-10-CM

## 2023-06-28 DIAGNOSIS — L089 Local infection of the skin and subcutaneous tissue, unspecified: Secondary | ICD-10-CM

## 2023-06-28 DIAGNOSIS — L723 Sebaceous cyst: Secondary | ICD-10-CM | POA: Diagnosis not present

## 2023-06-28 NOTE — Progress Notes (Signed)
 06/28/2023  History of Present Illness: Mia Perez is a 89 y.o. female presenting for follow up of a right breast sebaceous cyst.  She was last seen on 05/31/23 at which time her wound was healing appropriately.  Today, she reports the wound is doing ok.  She's still having some drainage, but denies any worsening pain, redness, or purulence.  Past Medical History: Past Medical History:  Diagnosis Date   Acute otalgia, left 08/06/2020   Anemia    H/O   Arthritis    Breast cancer (HCC) 02/10/1996   left, lumpectomy and radiation   Breast cancer (HCC) 2017   right   CKD (chronic kidney disease), stage III (HCC)    COVID-19 03/30/2023   DVT (deep venous thrombosis) (HCC) 11/2014   Right leg   GERD (gastroesophageal reflux disease)    Hypertension    Lymphocytic colitis    Personal history of radiation therapy 1998, 2017   BREAST CA   Ulcerative colitis (HCC) 2014   Varicose veins    Wears dentures    Full upper, partial lower     Past Surgical History: Past Surgical History:  Procedure Laterality Date   BREAST EXCISIONAL BIOPSY Right 2014   NEG   BREAST LUMPECTOMY Left 1998   radiation   BREAST LUMPECTOMY Right 2017   BREAST LUMPECTOMY WITH SENTINEL LYMPH NODE BIOPSY Right 12/30/2015   8 mm, T1b, N0; RUOQ, ER 95%; PR: 80 %, Her 2 neu not overexpressed.Mammosite Radiation.   BREAST SURGERY Right 2014   Microcalcifications excised with wire localization/ Dr Marquita Situ   CATARACT EXTRACTION W/ INTRAOCULAR LENS IMPLANT Left    approx 2010   CATARACT EXTRACTION W/PHACO Right 06/14/2023   Procedure: CATARACT EXTRACTION PHACO AND INTRAOCULAR LENS PLACEMENT (IOC) RIGHT 7.92, 00:47.5;  Surgeon: Rosa College, MD;  Location: [facility];  Service: Ophthalmology;  Laterality: Right;   COLONOSCOPY  2012   Dr Andriette Keeling   DILATION AND CURETTAGE OF UTERUS     HERNIA REPAIR     HYSTEROSCOPY     MELANOMA EXCISION     middle of chest between breasts, Dr Lehman Pummel   NECK  SURGERY     PERIPHERAL VASCULAR CATHETERIZATION N/A 12/13/2014   Procedure: IVC Filter Insertion;  Surgeon: Celso College, MD;  Location: [facility];  Service: Cardiovascular;  Laterality: N/A;   PERIPHERAL VASCULAR CATHETERIZATION  12/13/2014   Procedure: Lower Extremity Intervention;  Surgeon: Celso College, MD;  Location: [facility];  Service: Cardiovascular;;   PERIPHERAL VASCULAR CATHETERIZATION N/A 02/20/2015   Procedure: IVC Filter Removal;  Surgeon: Celso College, MD;  Location: [facility];  Service: Cardiovascular;  Laterality: N/A;    Home Medications: Prior to Admission medications   Medication Sig Start Date End Date Taking? Authorizing Provider  amLODipine  (NORVASC ) 2.5 MG tablet TAKE 1 TABLET BY MOUTH DAILY IF BP IS >140/90 2.5 MG 02/24/23  Yes Arnett, Hanley Lew, FNP  amLODipine  (NORVASC ) 5 MG tablet Take 1 tablet (5 mg total) by mouth daily. 05/05/23  Yes Calista Catching, FNP  aspirin 325 MG tablet Take 325 mg by mouth daily.   Yes [provider]  budesonide  (ENTOCORT EC ) 3 MG 24 hr capsule TAKE 3 CAPSULES (9 MG TOTAL) BY MOUTH DAILY. 09/22/21  Yes Vanga, Elson Halon, MD  cholecalciferol (VITAMIN D ) 25 MCG (1000 UNIT) tablet Take 1,000 Units by mouth daily.   Yes [provider]  Cyanocobalamin (VITAMIN B-12) 5000 MCG TBDP  Take 1,000 mcg by mouth daily.   Yes [provider]  dorzolamide (TRUSOPT) 2 % ophthalmic solution 1 drop 2 (two) times daily. 05/22/22  Yes [provider]  dorzolamidel-timolol (COSOPT) 22.3-6.8 MG/ML SOLN ophthalmic solution Place 1 drop into both eyes 2 (two) times daily. Take one drop in both eyes in the morning & evening before bed.   Yes [provider]  ezetimibe  (ZETIA ) 10 MG tablet Take 1 tablet (10 mg total) by mouth daily. 11/05/22  Yes Calista Catching, FNP  fluticasone  (FLONASE ) 50 MCG/ACT nasal spray Place 2 sprays into both nostrils daily. 11/05/22  Yes Calista Catching,  FNP  hydrochlorothiazide  (MICROZIDE ) 12.5 MG capsule Take 1 capsule (12.5 mg total) by mouth daily as needed. Take for blood pressure > 145/80 05/05/23  Yes Arnett, Hanley Lew, FNP  losartan  (COZAAR ) 100 MG tablet TAKE 1 TABLET BY MOUTH EVERY DAY IN THE EVENING (100 MG) 05/05/23  Yes Arnett, Hanley Lew, FNP  LUMIGAN 0.01 % SOLN Place 1 drop into both eyes at bedtime.  12/09/15  Yes [provider]  metoprolol  succinate (TOPROL -XL) 100 MG 24 hr tablet TAKE 1 TABLET BY MOUTH WITH OR IMMEDIATELY FOLLOWING A MEAL. 03/26/23  Yes Arnett, Hanley Lew, FNP  Multiple Vitamins-Minerals (PRESERVISION AREDS 2 PO) Take 1 tablet by mouth 2 (two) times daily.    Yes [provider]  simethicone (MYLICON) 125 MG chewable tablet Chew 125 mg by mouth every morning.   Yes [provider]    Allergies: Allergies  Allergen Reactions   Pravachol  [Pravastatin ]     diarrhea   Penicillins Rash    Has patient had a PCN reaction causing immediate rash, facial/tongue/throat swelling, SOB or lightheadedness with hypotension: Yes Has patient had a PCN reaction causing severe rash involving mucus membranes or skin necrosis: Unknown Has patient had a PCN reaction that required hospitalization No Has patient had a PCN reaction occurring within the last 10 years: No If all of the above answers are "NO", then may proceed with Cephalosporin use.     Review of Systems: Review of Systems  Constitutional:  Negative for chills and fever.  Respiratory:  Negative for shortness of breath.   Cardiovascular:  Negative for chest pain.  Skin:  Negative for rash.    Physical Exam BP 135/75   Pulse 62   Ht 5\' 7"  (1.702 m)   Wt 137 lb 12.8 oz (62.5 kg)   SpO2 95%   BMI 21.58 kg/m  CONSTITUTIONAL: No acute distress HEENT:  Normocephalic, atraumatic, extraocular motion intact. RESPIRATORY:  Normal respiratory effort without pathologic use of accessory muscles. SKIN:  Right breast wound is healing  appropriately.  There is better granulation tissue covering the depth of the wound, which is now about only 5 mm deep, and about 8 mm size.  Mild fibrinous material scraped, otherwise no purulence, erythema, or necrosis.  Silver nitrate applied and dry gauze dressing applied. NEUROLOGIC:  Motor and sensation is grossly normal.  Cranial nerves are grossly intact. PSYCH:  Alert and oriented to person, place and time. Affect is normal.  Assessment and Plan: This is a 89 y.o. female with infected sebaceous cyst of right breast.  --Patient's wound continues to heal appropriately.  The depth is certainly less compared to her last visit.  No necrotic tissue.  Silver nitrate applied to help the wound heal faster.  Dry gauze dressing applied. --Continue wound care as currently doing. --Follow up in 2-3 weeks.  I spent 10  minutes dedicated to the care of this patient on the date of this encounter to include pre-visit review of records, face-to-face time with the patient discussing diagnosis and management, and any post-visit coordination of care.   Marene Shape, MD [facility]

## 2023-06-28 NOTE — Patient Instructions (Addendum)
 We have placed Silver Nitrate in your wound to help is heal faster. You may notice some gray/black drainage for a day or so.   Change your dressing everyday. You may try getting in the shower with your dressing on to allow the water  to make it easier to remove the tape over your wound. Wash as usual, rinse well, and pat dry and replace your dressing.  You may use Hydrogen Peroxide to cleanse the area on days that you do not shower.    Follow up here in 3 weeks.

## 2023-07-14 ENCOUNTER — Ambulatory Visit: Admitting: Family

## 2023-07-14 ENCOUNTER — Encounter: Payer: Self-pay | Admitting: Family

## 2023-07-14 VITALS — BP 130/78 | HR 78 | Temp 97.7°F | Ht 67.0 in | Wt 136.6 lb

## 2023-07-14 DIAGNOSIS — R899 Unspecified abnormal finding in specimens from other organs, systems and tissues: Secondary | ICD-10-CM

## 2023-07-14 DIAGNOSIS — I10 Essential (primary) hypertension: Secondary | ICD-10-CM | POA: Diagnosis not present

## 2023-07-14 DIAGNOSIS — D649 Anemia, unspecified: Secondary | ICD-10-CM

## 2023-07-14 DIAGNOSIS — R911 Solitary pulmonary nodule: Secondary | ICD-10-CM | POA: Diagnosis not present

## 2023-07-14 LAB — IBC + FERRITIN
Ferritin: 44.2 ng/mL (ref 10.0–291.0)
Iron: 39 ug/dL — ABNORMAL LOW (ref 42–145)
Saturation Ratios: 11 % — ABNORMAL LOW (ref 20.0–50.0)
TIBC: 354.2 ug/dL (ref 250.0–450.0)
Transferrin: 253 mg/dL (ref 212.0–360.0)

## 2023-07-14 LAB — CBC WITH DIFFERENTIAL/PLATELET
Basophils Absolute: 0.1 10*3/uL (ref 0.0–0.1)
Basophils Relative: 0.7 % (ref 0.0–3.0)
Eosinophils Absolute: 0.2 10*3/uL (ref 0.0–0.7)
Eosinophils Relative: 1.9 % (ref 0.0–5.0)
HCT: 38.8 % (ref 36.0–46.0)
Hemoglobin: 12.8 g/dL (ref 12.0–15.0)
Lymphocytes Relative: 11.2 % — ABNORMAL LOW (ref 12.0–46.0)
Lymphs Abs: 1.1 10*3/uL (ref 0.7–4.0)
MCHC: 32.9 g/dL (ref 30.0–36.0)
MCV: 90.5 fl (ref 78.0–100.0)
Monocytes Absolute: 0.4 10*3/uL (ref 0.1–1.0)
Monocytes Relative: 4 % (ref 3.0–12.0)
Neutro Abs: 7.9 10*3/uL — ABNORMAL HIGH (ref 1.4–7.7)
Neutrophils Relative %: 82.2 % — ABNORMAL HIGH (ref 43.0–77.0)
Platelets: 364 10*3/uL (ref 150.0–400.0)
RBC: 4.29 Mil/uL (ref 3.87–5.11)
RDW: 16.7 % — ABNORMAL HIGH (ref 11.5–15.5)
WBC: 9.6 10*3/uL (ref 4.0–10.5)

## 2023-07-14 LAB — B12 AND FOLATE PANEL
Folate: >23.2 ng/mL (ref >5.9)
Vitamin B-12: >1500 pg/mL — ABNORMAL HIGH (ref 211–911)

## 2023-07-14 MED ORDER — HYDROCHLOROTHIAZIDE 12.5 MG PO CAPS: 12.5000 mg | ORAL_CAPSULE | Freq: Every day | ORAL | Status: DC

## 2023-07-14 MED ORDER — AMLODIPINE BESYLATE 2.5 MG PO TABS: 2.5000 mg | ORAL_TABLET | Freq: Every day | ORAL | Status: DC

## 2023-07-14 NOTE — Patient Instructions (Addendum)
 Do not take hydralazine . We can add back hydralazine  if needed in the future however Im more concerned with hydralazine  causing a quick, significant drop in blood pressure.   Decrease amlodipine  to 2.5mg  from 5mg  to ensure not contributory to leg swelling.  You can take hydrochlorothiazide  12.5mg  daily or every other day as long as blood pressure is not too low or you feel lightheaded.   Nice to see you!Aaron Aas

## 2023-07-14 NOTE — Progress Notes (Unsigned)
 Assessment & Plan:  There are no diagnoses linked to this encounter.   Return precautions given.   Risks, benefits, and alternatives of the medications and treatment plan prescribed today were discussed, and patient expressed understanding.   Education regarding symptom management and diagnosis given to patient on AVS either electronically or printed.  No follow-ups on file.  [name], FNP  Subjective:    [name]: Mia Perez, female    DOB: 1933-04-14, 89 y.o.   MRN: [id number]  CC: Mia Perez is a 89 y.o. female who presents today for follow up.   HPI: Follow up anemia, CT chest  Denies rectal bleeding, hematuria, vaginal bleeding.   She had stopped hydralazine  2 months ago. She has been concerned with BP approaching 150/80. She has started to take hydralazine  10mg  BID  She is compliant with amlodipine  5 mg every morning, losartan  100 mg every night,   toprol  100 mg  lunch time.  She takes hctz 12.5mg  prn for > 140/80 and when legs are swollen.     Continue additional dose of amlodipine  2.5 mg if needed for blood pressure greater than 140/80; she hasn't used in a long time.     Follow-up nephrology November 18, 2023.  At prior appointment 05/13/2023, hydralazine  dced  Allergies: Pravachol  [pravastatin ] and Penicillins Current Outpatient Medications on File Prior to Visit  Medication Sig Dispense Refill   amLODipine  (NORVASC ) 2.5 MG tablet TAKE 1 TABLET BY MOUTH DAILY IF BP IS >140/90 2.5 MG 90 tablet 0   amLODipine  (NORVASC ) 5 MG tablet Take 1 tablet (5 mg total) by mouth daily. 90 tablet 3   aspirin 325 MG tablet Take 325 mg by mouth daily.     budesonide  (ENTOCORT EC ) 3 MG 24 hr capsule TAKE 3 CAPSULES (9 MG TOTAL) BY MOUTH DAILY. 270 capsule 11   cholecalciferol (VITAMIN D ) 25 MCG (1000 UNIT) tablet Take 1,000 Units by mouth daily.     Cyanocobalamin (VITAMIN B-12) 5000 MCG TBDP Take 1,000 mcg by mouth daily.     dorzolamide (TRUSOPT) 2 % ophthalmic  solution 1 drop 2 (two) times daily.     dorzolamidel-timolol (COSOPT) 22.3-6.8 MG/ML SOLN ophthalmic solution Place 1 drop into both eyes 2 (two) times daily. Take one drop in both eyes in the morning & evening before bed.     ezetimibe  (ZETIA ) 10 MG tablet Take 1 tablet (10 mg total) by mouth daily. 90 tablet 3   fluticasone  (FLONASE ) 50 MCG/ACT nasal spray Place 2 sprays into both nostrils daily. 16 g 6   hydrochlorothiazide  (MICROZIDE ) 12.5 MG capsule Take 1 capsule (12.5 mg total) by mouth daily as needed. Take for blood pressure > 145/80 90 capsule 1   losartan  (COZAAR ) 100 MG tablet TAKE 1 TABLET BY MOUTH EVERY DAY IN THE EVENING (100 MG) 90 tablet 1   LUMIGAN 0.01 % SOLN Place 1 drop into both eyes at bedtime.      metoprolol  succinate (TOPROL -XL) 100 MG 24 hr tablet TAKE 1 TABLET BY MOUTH WITH OR IMMEDIATELY FOLLOWING A MEAL. 90 tablet 3   Multiple Vitamins-Minerals (PRESERVISION AREDS 2 PO) Take 1 tablet by mouth 2 (two) times daily.      simethicone (MYLICON) 125 MG chewable tablet Chew 125 mg by mouth every morning.     No current facility-administered medications on file prior to visit.    Review of Systems  Constitutional:  Negative for chills and fever.  Respiratory:  Negative for cough.   Cardiovascular:  Negative for chest pain and palpitations.  Gastrointestinal:  Negative for nausea and vomiting.      Objective:    BP 130/78   Pulse 78   Temp 97.7 F (36.5 C) (Oral)   Ht 5\' 7"  (1.702 m)   Wt 136 lb 9.6 oz (62 kg)   SpO2 98%   BMI 21.39 kg/m  BP Readings from Last 3 Encounters:  07/14/23 130/78  06/28/23 135/75  06/14/23 107/83   Wt Readings from Last 3 Encounters:  07/14/23 136 lb 9.6 oz (62 kg)  06/28/23 137 lb 12.8 oz (62.5 kg)  06/14/23 133 lb 11.2 oz (60.6 kg)    Physical Exam Vitals reviewed.  Constitutional:      Appearance: She is well-developed.  Eyes:     Conjunctiva/sclera: Conjunctivae normal.  Cardiovascular:     Rate and Rhythm: Normal  rate and regular rhythm.     Pulses: Normal pulses.     Heart sounds: Normal heart sounds.  Pulmonary:     Effort: Pulmonary effort is normal.     Breath sounds: Normal breath sounds. No wheezing, rhonchi or rales.  Skin:    General: Skin is warm and dry.  Neurological:     Mental Status: She is alert.  Psychiatric:        Speech: Speech normal.        Behavior: Behavior normal.        Thought Content: Thought content normal.

## 2023-07-15 DIAGNOSIS — D649 Anemia, unspecified: Secondary | ICD-10-CM | POA: Insufficient documentation

## 2023-07-15 DIAGNOSIS — R911 Solitary pulmonary nodule: Secondary | ICD-10-CM | POA: Insufficient documentation

## 2023-07-15 NOTE — Assessment & Plan Note (Addendum)
 Normocytic anemia resolved.Pending completion of labs, urine studies.

## 2023-07-15 NOTE — Assessment & Plan Note (Signed)
 Discussed at length CT chest.  Discussed history of breast cancer and recommendation to repeat chest in 12 months time.  She politely declines further evaluation of all incidental findings including enlarged pulmonary arteries.  Fortunately no symptoms of overt pulmonary hypertension at this time.  She politely declines echocardiogram.  Will monitor

## 2023-07-15 NOTE — Assessment & Plan Note (Addendum)
 Chronic, stable.  History of labile blood pressure.  She is no longer on hydralazine  10 mg TID.  Decrease amlodipine  to 2.5 mg every morning. Continue losartan  100 mg every night,   toprol  100 mg .  Start hctz to 12.5 mg every day for blood pressure control and periodic BLE.

## 2023-07-16 ENCOUNTER — Ambulatory Visit: Admitting: Surgery

## 2023-07-16 ENCOUNTER — Encounter: Payer: Self-pay | Admitting: Surgery

## 2023-07-16 VITALS — BP 140/73 | HR 57 | Ht 67.0 in | Wt 137.0 lb

## 2023-07-16 DIAGNOSIS — L723 Sebaceous cyst: Secondary | ICD-10-CM

## 2023-07-16 DIAGNOSIS — L089 Local infection of the skin and subcutaneous tissue, unspecified: Secondary | ICD-10-CM

## 2023-07-16 LAB — MULTIPLE MYELOMA PANEL, SERUM
Albumin SerPl Elph-Mcnc: 3.2 g/dL (ref 2.9–4.4)
Albumin/Glob SerPl: 1 (ref 0.7–1.7)
Alpha 1: 0.2 g/dL (ref 0.0–0.4)
Alpha2 Glob SerPl Elph-Mcnc: 1 g/dL (ref 0.4–1.0)
B-Globulin SerPl Elph-Mcnc: 1.3 g/dL (ref 0.7–1.3)
Gamma Glob SerPl Elph-Mcnc: 0.9 g/dL (ref 0.4–1.8)
Globulin, Total: 3.4 g/dL (ref 2.2–3.9)
IgA/Immunoglobulin A, Serum: 430 mg/dL — ABNORMAL HIGH (ref 64–422)
IgG (Immunoglobin G), Serum: 978 mg/dL (ref 586–1602)
IgM (Immunoglobulin M), Srm: 46 mg/dL (ref 26–217)
Total Protein: 6.6 g/dL (ref 6.0–8.5)

## 2023-07-16 NOTE — Progress Notes (Signed)
 07/16/2023  History of Present Illness: Mia Perez is a 89 y.o. female presenting for follow up of a right breast sebaceous cyst.  She was last seen on 06/28/23, at which time, the wound was treated with silver nitrate.  Today, she reports the wound is draining some every other day.  Denies any worsening pain.  She feels there's a scab over the wound, but she has a harder time seeing it due to location.  Past Medical History: Past Medical History:  Diagnosis Date   Acute otalgia, left 08/06/2020   Anemia    H/O   Arthritis    Breast cancer (HCC) 02/10/1996   left, lumpectomy and radiation   Breast cancer (HCC) 2017   right   CKD (chronic kidney disease), stage III (HCC)    COVID-19 03/30/2023   DVT (deep venous thrombosis) (HCC) 11/2014   Right leg   GERD (gastroesophageal reflux disease)    Hypertension    Lymphocytic colitis    Personal history of radiation therapy 1998, 2017   BREAST CA   Ulcerative colitis (HCC) 2014   Varicose veins    Wears dentures    Full upper, partial lower     Past Surgical History: Past Surgical History:  Procedure Laterality Date   BREAST EXCISIONAL BIOPSY Right 2014   NEG   BREAST LUMPECTOMY Left 1998   radiation   BREAST LUMPECTOMY Right 2017   BREAST LUMPECTOMY WITH SENTINEL LYMPH NODE BIOPSY Right 12/30/2015   8 mm, T1b, N0; RUOQ, ER 95%; PR: 80 %, Her 2 neu not overexpressed.Mammosite Radiation.   BREAST SURGERY Right 2014   Microcalcifications excised with wire localization/ Dr Marquita Situ   CATARACT EXTRACTION W/ INTRAOCULAR LENS IMPLANT Left    approx 2010   CATARACT EXTRACTION W/PHACO Right 06/14/2023   Procedure: CATARACT EXTRACTION PHACO AND INTRAOCULAR LENS PLACEMENT (IOC) RIGHT 7.92, 00:47.5;  Surgeon: Rosa College, MD;  Location: [facility];  Service: Ophthalmology;  Laterality: Right;   COLONOSCOPY  2012   Dr Andriette Keeling   DILATION AND CURETTAGE OF UTERUS     HERNIA REPAIR     HYSTEROSCOPY     MELANOMA EXCISION      middle of chest between breasts, Dr Lehman Pummel   NECK SURGERY     PERIPHERAL VASCULAR CATHETERIZATION N/A 12/13/2014   Procedure: IVC Filter Insertion;  Surgeon: Celso College, MD;  Location: [facility];  Service: Cardiovascular;  Laterality: N/A;   PERIPHERAL VASCULAR CATHETERIZATION  12/13/2014   Procedure: Lower Extremity Intervention;  Surgeon: Celso College, MD;  Location: [facility];  Service: Cardiovascular;;   PERIPHERAL VASCULAR CATHETERIZATION N/A 02/20/2015   Procedure: IVC Filter Removal;  Surgeon: Celso College, MD;  Location: [facility];  Service: Cardiovascular;  Laterality: N/A;    Home Medications: Prior to Admission medications   Medication Sig Start Date End Date Taking? Authorizing Provider  amLODipine  (NORVASC ) 2.5 MG tablet TAKE 1 TABLET BY MOUTH DAILY IF BP IS >140/90 2.5 MG 02/24/23  Yes Arnett, Hanley Lew, FNP  amLODipine  (NORVASC ) 5 MG tablet Take 1 tablet (5 mg total) by mouth daily. 05/05/23  Yes Calista Catching, FNP  aspirin 325 MG tablet Take 325 mg by mouth daily.   Yes [provider]  budesonide  (ENTOCORT EC ) 3 MG 24 hr capsule TAKE 3 CAPSULES (9 MG TOTAL) BY MOUTH DAILY. 09/22/21  Yes Vanga, Elson Halon, MD  cholecalciferol (VITAMIN D ) 25 MCG (1000 UNIT) tablet Take 1,000 Units  by mouth daily.   Yes [provider]  Cyanocobalamin (VITAMIN B-12) 5000 MCG TBDP Take 1,000 mcg by mouth daily.   Yes [provider]  dorzolamide (TRUSOPT) 2 % ophthalmic solution 1 drop 2 (two) times daily. 05/22/22  Yes [provider]  dorzolamidel-timolol (COSOPT) 22.3-6.8 MG/ML SOLN ophthalmic solution Place 1 drop into both eyes 2 (two) times daily. Take one drop in both eyes in the morning & evening before bed.   Yes [provider]  ezetimibe  (ZETIA ) 10 MG tablet Take 1 tablet (10 mg total) by mouth daily. 11/05/22  Yes Calista Catching, FNP  fluticasone  (FLONASE ) 50 MCG/ACT nasal spray Place 2 sprays  into both nostrils daily. 11/05/22  Yes Arnett, Hanley Lew, FNP  hydrochlorothiazide  (MICROZIDE ) 12.5 MG capsule Take 1 capsule (12.5 mg total) by mouth daily as needed. Take for blood pressure > 145/80 05/05/23  Yes Arnett, Hanley Lew, FNP  losartan  (COZAAR ) 100 MG tablet TAKE 1 TABLET BY MOUTH EVERY DAY IN THE EVENING (100 MG) 05/05/23  Yes Arnett, Hanley Lew, FNP  LUMIGAN 0.01 % SOLN Place 1 drop into both eyes at bedtime.  12/09/15  Yes [provider]  metoprolol  succinate (TOPROL -XL) 100 MG 24 hr tablet TAKE 1 TABLET BY MOUTH WITH OR IMMEDIATELY FOLLOWING A MEAL. 03/26/23  Yes Arnett, Hanley Lew, FNP  Multiple Vitamins-Minerals (PRESERVISION AREDS 2 PO) Take 1 tablet by mouth 2 (two) times daily.    Yes [provider]  simethicone (MYLICON) 125 MG chewable tablet Chew 125 mg by mouth every morning.   Yes [provider]    Allergies: Allergies  Allergen Reactions   Pravachol  [Pravastatin ]     diarrhea   Penicillins Rash    Has patient had a PCN reaction causing immediate rash, facial/tongue/throat swelling, SOB or lightheadedness with hypotension: Yes Has patient had a PCN reaction causing severe rash involving mucus membranes or skin necrosis: Unknown Has patient had a PCN reaction that required hospitalization No Has patient had a PCN reaction occurring within the last 10 years: No If all of the above answers are "NO", then may proceed with Cephalosporin use.     Review of Systems: Review of Systems  Constitutional:  Negative for chills and fever.  Respiratory:  Negative for shortness of breath.   Cardiovascular:  Negative for chest pain.  Skin:  Negative for rash.    Physical Exam BP (!) 140/73   Pulse (!) 57   Ht 5\' 7"  (1.702 m)   Wt 137 lb (62.1 kg)   SpO2 98%   BMI 21.46 kg/m  CONSTITUTIONAL: No acute distress HEENT:  Normocephalic, atraumatic, extraocular motion intact. RESPIRATORY:  Normal respiratory effort without pathologic use of  accessory muscles. SKIN:  Right breast wound is healing appropriately.  The wound continues to get smaller, and is now more shallow, about 2 mm deep only, and likely about 8 mm size still.  Most of it has a thin layer of skin trying to cover the wound.  Silver nitrate applied again and dry gauze dressing applied. NEUROLOGIC:  Motor and sensation is grossly normal.  Cranial nerves are grossly intact. PSYCH:  Alert and oriented to person, place and time. Affect is normal.  Assessment and Plan: This is a 89 y.o. female with infected sebaceous cyst of right breast.  --Patient's wound continues to heal appropriately and the depth/size overall is improving.  Silver nitrate applied again today to continue helping this heal. --Follow up in 2 weeks to reassess.  If the wound is fully healed then, we can discuss scheduling her for excision.  She reports she's moving to [facility] mid July, so she also wants to make sure that any procedures won't interfere with her move.  I spent 10 minutes dedicated to the care of this patient on the date of this encounter to include pre-visit review of records, face-to-face time with the patient discussing diagnosis and management, and any post-visit coordination of care.   Marene Shape, MD [facility]

## 2023-07-16 NOTE — Patient Instructions (Addendum)
 We placed a second dose of Silver Nitrate to the area. You will notice some gray/black on your dressing for a few days.   Continue to shower and wash the area daily, rinse well, and pat dry. Then put on a new gauze.    Follow-up with our office in 2 weeks.   Please call and ask to speak with a nurse if you develop questions or concerns.

## 2023-07-18 LAB — URINALYSIS W MICROSCOPIC + REFLEX CULTURE
Bacteria, UA: NONE SEEN /HPF
Bilirubin Urine: NEGATIVE
Glucose, UA: NEGATIVE
Hgb urine dipstick: NEGATIVE
Ketones, ur: NEGATIVE
Nitrites, Initial: NEGATIVE
Protein, ur: NEGATIVE
RBC / HPF: NONE SEEN /HPF (ref 0–2)
Specific Gravity, Urine: 1.012 (ref 1.001–1.035)
pH: 5.5 (ref 5.0–8.0)

## 2023-07-18 LAB — CULTURE INDICATED

## 2023-07-18 LAB — URINE CULTURE

## 2023-07-18 LAB — ERYTHROPOIETIN: Erythropoietin: 14.9 m[IU]/mL (ref 2.6–18.5)

## 2023-07-28 DIAGNOSIS — H353132 Nonexudative age-related macular degeneration, bilateral, intermediate dry stage: Secondary | ICD-10-CM | POA: Diagnosis not present

## 2023-07-30 ENCOUNTER — Telehealth: Payer: Self-pay | Admitting: Family

## 2023-07-30 DIAGNOSIS — R899 Unspecified abnormal finding in specimens from other organs, systems and tissues: Secondary | ICD-10-CM

## 2023-07-30 NOTE — Telephone Encounter (Signed)
 Where is urine culture from 07/14/23?

## 2023-08-02 ENCOUNTER — Ambulatory Visit: Admitting: Surgery

## 2023-08-02 ENCOUNTER — Ambulatory Visit: Payer: Self-pay | Admitting: Family

## 2023-08-02 NOTE — Telephone Encounter (Signed)
 LVM to call back to go over UA UC results and schedule repeat UA AND UC please schedule and  go over results

## 2023-08-02 NOTE — Telephone Encounter (Unsigned)
 Copied from CRM [id number]. Topic: Clinical - Request for Lab/Test Order >> Aug 02, 2023 11:41 AM Adelita E wrote: Reason for CRM: Patient will be needing a repeat UA and urine culture. Callback number [phone].

## 2023-08-02 NOTE — Telephone Encounter (Signed)
 Pt scheduled for 08/05/23 for repeat ua and uc

## 2023-08-02 NOTE — Telephone Encounter (Signed)
 Call pt Order UA and urine culture Rest of labs will be resulted this week  Urine shows elevation white blood cells.  Is also evidence of squamous epithelial cells which I question if are from the skin.  Please ensure that you are following clean-catch protocol below.  Also like to recollect your urine to ensure you do not have an infection.  Please call the office to schedule repeat urine in the next week, sooner if any symptoms of urinary tract infection.  A clean catch is a method of collecting a urine sample to be tested. The clean-catch urine method is used to prevent germs from the penis or vagina from getting into a urine sample.  How the Test is Performed If possible, collect the sample when urine has been in your bladder for 2 to 3 hours.  You will use a special kit to collect the urine. It will most likely have a cup with a lid and wipes.  Wash your hands with soap and warm water .  GIRLS AND WOMEN  Girls and women need to wash the area between the vagina lips (labia). You may be given a special clean-catch kit that contains sterile wipes.  Sit on the toilet with your legs spread apart. Use two fingers to spread open your labia. Use the first wipe to clean the inner folds of the labia. Wipe from the front to the back. Use a second wipe to clean over the opening where urine comes out (urethra), just above the opening of the vagina. To collect the urine sample:  Keeping your labia spread open, urinate a small amount into the toilet bowl, then stop the flow of urine. Hold the urine cup a few inches (or a few centimeters) from the urethra and urinate until the cup is about half full. You may finish urinating into the toilet bowl.

## 2023-08-05 ENCOUNTER — Other Ambulatory Visit: Payer: Self-pay | Admitting: Family

## 2023-08-05 ENCOUNTER — Other Ambulatory Visit (INDEPENDENT_AMBULATORY_CARE_PROVIDER_SITE_OTHER)

## 2023-08-05 DIAGNOSIS — R899 Unspecified abnormal finding in specimens from other organs, systems and tissues: Secondary | ICD-10-CM

## 2023-08-05 LAB — URINALYSIS, ROUTINE W REFLEX MICROSCOPIC
Bilirubin Urine: NEGATIVE
Hgb urine dipstick: NEGATIVE
Ketones, ur: NEGATIVE
Leukocytes,Ua: NEGATIVE
Nitrite: NEGATIVE
RBC / HPF: NONE SEEN (ref 0–?)
Specific Gravity, Urine: 1.015 (ref 1.000–1.030)
Total Protein, Urine: NEGATIVE
Urine Glucose: NEGATIVE
Urobilinogen, UA: 0.2 (ref 0.0–1.0)
pH: 6 (ref 5.0–8.0)

## 2023-08-06 ENCOUNTER — Encounter: Payer: Self-pay | Admitting: Surgery

## 2023-08-06 ENCOUNTER — Ambulatory Visit: Admitting: Surgery

## 2023-08-06 VITALS — BP 118/67 | HR 64 | Temp 97.7°F | Ht 67.0 in | Wt 136.8 lb

## 2023-08-06 DIAGNOSIS — L723 Sebaceous cyst: Secondary | ICD-10-CM

## 2023-08-06 DIAGNOSIS — L089 Local infection of the skin and subcutaneous tissue, unspecified: Secondary | ICD-10-CM

## 2023-08-06 LAB — URINE CULTURE
MICRO NUMBER:: 16629243
Result:: NO GROWTH
SPECIMEN QUALITY:: ADEQUATE

## 2023-08-06 NOTE — Patient Instructions (Signed)
 Pocket of Fluid in the Skin (Epidermoid Cyst): What to Know  An epidermoid cyst is a small lump under your skin. The cyst contains a substance that is thick and oily. What are the causes? A blocked hair follicle. A hair curls and re-enters the skin instead of growing straight out of the skin. A blocked pore. Irritated skin. An injury to the skin. Certain conditions that are passed from parent to child. Human papillomavirus (HPV). This happens rarely when cysts occur on the bottom of the feet. Long-term sun damage to the skin. What increases the risk? Having acne. Being female. Having an injury to the skin. Being past puberty. Certain conditions that are passed down through family (genetic disorder). What are the signs or symptoms? The only sign of this type of cyst may be a small, painless lump under the skin. These cysts are usually painless, but they can get infected. Symptoms of infection may include: Redness. Inflammation. Tenderness. Warmth. Fever. A bad-smelling substance that drains from the cyst. Pus that drains from the cyst. How is this treated? If a cyst becomes inflamed, treatment may include: Opening and draining the cyst. Antibiotics. Shots of medicines called steroids that help lessen inflammation. Surgery to remove the cyst if it's large, painful, or could turn into cancer. Do not try to open or squeeze a cyst yourself. Follow these instructions at home: Medicines Take your medicines only as told. If you were given antibiotics, take them as told. Do not stop taking them even if you start to feel better. General instructions Keep the area around your cyst clean and dry. Wear loose, dry clothing. Avoid touching your cyst. Check your cyst every day for signs of infection. Check for: Redness, swelling, or pain. Fluid or blood. Warmth. Pus or a bad smell. Keep all follow-up visits to make sure there's no discomfort or infection. Contact a doctor if: You have  any signs of infection. Your cyst doesn't get better or gets worse. You get a cyst that looks different from other cysts you've had. You have a fever. You have redness that spreads from the cyst. This information is not intended to replace advice given to you by your health care provider. Make sure you discuss any questions you have with your health care provider. Document Revised: 09/11/2022 Document Reviewed: 09/11/2022 Elsevier Patient Education  2024 [employer].

## 2023-08-06 NOTE — Progress Notes (Signed)
 08/06/2023  History of Present Illness: Mia Perez is a 89 y.o. female presenting for follow-up of a right breast infected sebaceous cyst.  She was last seen on 07/16/2023.  Today the patient reports that the wound continues to drain intermittently.  Denies any worsening pain.  She reports that she is moving to an assisted facility in about 2 weeks and she is also scheduled for left eye surgery at the end of next month and there is a lot overall going on.  Past Medical History: Past Medical History:  Diagnosis Date   Acute otalgia, left 08/06/2020   Anemia    H/O   Arthritis    Breast cancer (HCC) 02/10/1996   left, lumpectomy and radiation   Breast cancer (HCC) 2017   right   CKD (chronic kidney disease), stage III (HCC)    COVID-19 03/30/2023   DVT (deep venous thrombosis) (HCC) 11/2014   Right leg   GERD (gastroesophageal reflux disease)    Hypertension    Lymphocytic colitis    Personal history of radiation therapy 1998, 2017   BREAST CA   Ulcerative colitis (HCC) 2014   Varicose veins    Wears dentures    Full upper, partial lower     Past Surgical History: Past Surgical History:  Procedure Laterality Date   BREAST EXCISIONAL BIOPSY Right 2014   NEG   BREAST LUMPECTOMY Left 1998   radiation   BREAST LUMPECTOMY Right 2017   BREAST LUMPECTOMY WITH SENTINEL LYMPH NODE BIOPSY Right 12/30/2015   8 mm, T1b, N0; RUOQ, ER 95%; PR: 80 %, Her 2 neu not overexpressed.Mammosite Radiation.   BREAST SURGERY Right 2014   Microcalcifications excised with wire localization/ Dr Dessa   CATARACT EXTRACTION W/ INTRAOCULAR LENS IMPLANT Left    approx 2010   CATARACT EXTRACTION W/PHACO Right 06/14/2023   Procedure: CATARACT EXTRACTION PHACO AND INTRAOCULAR LENS PLACEMENT (IOC) RIGHT 7.92, 00:47.5;  Surgeon: Myrna Adine Anes, MD;  Location: [facility];  Service: Ophthalmology;  Laterality: Right;   COLONOSCOPY  2012   Dr Luis   DILATION AND CURETTAGE OF UTERUS      HERNIA REPAIR     HYSTEROSCOPY     MELANOMA EXCISION     middle of chest between breasts, Dr Chrystie   NECK SURGERY     PERIPHERAL VASCULAR CATHETERIZATION N/A 12/13/2014   Procedure: IVC Filter Insertion;  Surgeon: Selinda GORMAN Gu, MD;  Location: [facility];  Service: Cardiovascular;  Laterality: N/A;   PERIPHERAL VASCULAR CATHETERIZATION  12/13/2014   Procedure: Lower Extremity Intervention;  Surgeon: Selinda GORMAN Gu, MD;  Location: [facility];  Service: Cardiovascular;;   PERIPHERAL VASCULAR CATHETERIZATION N/A 02/20/2015   Procedure: IVC Filter Removal;  Surgeon: Selinda GORMAN Gu, MD;  Location: [facility];  Service: Cardiovascular;  Laterality: N/A;    Home Medications: Prior to Admission medications   Medication Sig Start Date End Date Taking? Authorizing Provider  amLODipine  (NORVASC ) 2.5 MG tablet Take 1 tablet (2.5 mg total) by mouth daily. 07/14/23  Yes Dineen Rollene MATSU, FNP  aspirin 325 MG tablet Take 325 mg by mouth daily.   Yes [provider]  budesonide  (ENTOCORT EC ) 3 MG 24 hr capsule TAKE 3 CAPSULES (9 MG TOTAL) BY MOUTH DAILY. 09/22/21  Yes Vanga, Corinn Skiff, MD  cholecalciferol (VITAMIN D ) 25 MCG (1000 UNIT) tablet Take 1,000 Units by mouth daily.   Yes [provider]  Cyanocobalamin  (VITAMIN B-12) 5000 MCG TBDP Take  1,000 mcg by mouth daily.   Yes [provider]  dorzolamide (TRUSOPT) 2 % ophthalmic solution 1 drop 2 (two) times daily. 05/22/22  Yes [provider]  dorzolamidel-timolol (COSOPT) 22.3-6.8 MG/ML SOLN ophthalmic solution Place 1 drop into both eyes 2 (two) times daily. Take one drop in both eyes in the morning & evening before bed.   Yes [provider]  ezetimibe  (ZETIA ) 10 MG tablet Take 1 tablet (10 mg total) by mouth daily. 11/05/22  Yes Arnett, Rollene MATSU, FNP  fluticasone  (FLONASE ) 50 MCG/ACT nasal spray Place 2 sprays into both nostrils daily. 11/05/22  Yes Dineen Rollene MATSU, FNP   hydrochlorothiazide  (MICROZIDE ) 12.5 MG capsule Take 1 capsule (12.5 mg total) by mouth daily. 07/14/23  Yes Arnett, Rollene MATSU, FNP  losartan  (COZAAR ) 100 MG tablet TAKE 1 TABLET BY MOUTH EVERY DAY IN THE EVENING (100 MG) 05/05/23  Yes Arnett, Rollene MATSU, FNP  LUMIGAN 0.01 % SOLN Place 1 drop into both eyes at bedtime.  12/09/15  Yes [provider]  metoprolol  succinate (TOPROL -XL) 100 MG 24 hr tablet TAKE 1 TABLET BY MOUTH WITH OR IMMEDIATELY FOLLOWING A MEAL. 03/26/23  Yes Arnett, Rollene MATSU, FNP  Multiple Vitamins-Minerals (PRESERVISION AREDS 2 PO) Take 1 tablet by mouth 2 (two) times daily.    Yes [provider]  simethicone (MYLICON) 125 MG chewable tablet Chew 125 mg by mouth every morning.   Yes [provider]    Allergies: Allergies  Allergen Reactions   Pravachol  [Pravastatin ]     diarrhea   Penicillins Rash    Has patient had a PCN reaction causing immediate rash, facial/tongue/throat swelling, SOB or lightheadedness with hypotension: Yes Has patient had a PCN reaction causing severe rash involving mucus membranes or skin necrosis: Unknown Has patient had a PCN reaction that required hospitalization No Has patient had a PCN reaction occurring within the last 10 years: No If all of the above answers are NO, then may proceed with Cephalosporin use.     Review of Systems: Review of Systems  Constitutional:  Negative for chills and fever.  Respiratory:  Negative for shortness of breath.   Cardiovascular:  Negative for chest pain.  Gastrointestinal:  Negative for nausea and vomiting.  Skin:  Positive for itching.    Physical Exam BP 118/67   Pulse 64   Temp 97.7 F (36.5 C) (Oral)   Ht 5' 7 (1.702 m)   Wt 136 lb 12.8 oz (62.1 kg)   SpO2 95%   BMI 21.43 kg/m  CONSTITUTIONAL: No acute distress HEENT:  Normocephalic, atraumatic, extraocular motion intact. RESPIRATORY:  Normal respiratory effort without pathologic use of accessory  muscles. CARDIOVASCULAR: Regular rhythm and rate. BREAST: Right breast wound remains open and now appears to be slightly bigger compared to the last visit measuring about 1 cm in length.  There is still healthy granulation tissue but when pushing on the deeper area of the wound, still cyst contents are able to be expressed.  Dry gauze dressing applied. NEUROLOGIC:  Motor and sensation is grossly normal.  Cranial nerves are grossly intact. PSYCH:  Alert and oriented to person, place and time. Affect is normal.   Assessment and Plan: This is a 89 y.o. female with an infected sebaceous cyst.  - Discussed with patient now has been 2+ months since the initial visit with her and the wound has not been healing yet.  I think overall the cyst capsule is not sealing itself which is allowing for drainage  from the cyst itself to continue continue with an open wound does not healing due to this.  As such, I think it would be more prudent to proceed with just excision of the cyst with the possibility of leaving this as an open wound if there is still any remaining infection leftover versus being able to close the incision.  At this time the patient reports that there is a lot going on that she has plan for next month including moving to an assisted living facility as well as left eye surgery.  She would like to wait until after that is completed so that she can focus on these issues without troubles with the right breast. - Will schedule patient tentatively for an office procedure early August to remove the breast skin sebaceous cyst.  I spent 20 minutes dedicated to the care of this patient on the date of this encounter to include pre-visit review of records, face-to-face time with the patient discussing diagnosis and management, and any post-visit coordination of care.   Aloysius Sheree Plant, MD [facility]

## 2023-08-09 ENCOUNTER — Ambulatory Visit: Payer: Self-pay | Admitting: Family

## 2023-08-17 ENCOUNTER — Ambulatory Visit: Admitting: Family

## 2023-08-19 ENCOUNTER — Inpatient Hospital Stay

## 2023-08-19 ENCOUNTER — Inpatient Hospital Stay: Attending: Oncology | Admitting: Oncology

## 2023-08-19 VITALS — BP 132/70 | HR 61 | Temp 98.2°F | Resp 16 | Wt 140.0 lb

## 2023-08-19 DIAGNOSIS — N182 Chronic kidney disease, stage 2 (mild): Secondary | ICD-10-CM | POA: Insufficient documentation

## 2023-08-19 DIAGNOSIS — D472 Monoclonal gammopathy: Secondary | ICD-10-CM

## 2023-08-19 DIAGNOSIS — Z853 Personal history of malignant neoplasm of breast: Secondary | ICD-10-CM | POA: Insufficient documentation

## 2023-08-19 DIAGNOSIS — R768 Other specified abnormal immunological findings in serum: Secondary | ICD-10-CM | POA: Diagnosis not present

## 2023-08-19 DIAGNOSIS — Z8041 Family history of malignant neoplasm of ovary: Secondary | ICD-10-CM | POA: Insufficient documentation

## 2023-08-19 LAB — CBC WITH DIFFERENTIAL/PLATELET
Abs Immature Granulocytes: 0.06 K/uL (ref 0.00–0.07)
Basophils Absolute: 0.1 K/uL (ref 0.0–0.1)
Basophils Relative: 1 %
Eosinophils Absolute: 0.3 K/uL (ref 0.0–0.5)
Eosinophils Relative: 3 %
HCT: 38.2 % (ref 36.0–46.0)
Hemoglobin: 12.2 g/dL (ref 12.0–15.0)
Immature Granulocytes: 1 %
Lymphocytes Relative: 22 %
Lymphs Abs: 2.1 K/uL (ref 0.7–4.0)
MCH: 29.7 pg (ref 26.0–34.0)
MCHC: 31.9 g/dL (ref 30.0–36.0)
MCV: 92.9 fL (ref 80.0–100.0)
Monocytes Absolute: 0.7 K/uL (ref 0.1–1.0)
Monocytes Relative: 7 %
Neutro Abs: 6.3 K/uL (ref 1.7–7.7)
Neutrophils Relative %: 66 %
Platelets: 312 K/uL (ref 150–400)
RBC: 4.11 MIL/uL (ref 3.87–5.11)
RDW: 14.6 % (ref 11.5–15.5)
WBC: 9.5 K/uL (ref 4.0–10.5)
nRBC: 0 % (ref 0.0–0.2)

## 2023-08-19 LAB — BASIC METABOLIC PANEL WITH GFR
Anion gap: 9 (ref 5–15)
BUN: 42 mg/dL — ABNORMAL HIGH (ref 8–23)
CO2: 23 mmol/L (ref 22–32)
Calcium: 8.9 mg/dL (ref 8.9–10.3)
Chloride: 106 mmol/L (ref 98–111)
Creatinine, Ser: 1.5 mg/dL — ABNORMAL HIGH (ref 0.44–1.00)
GFR, Estimated: 34 mL/min — ABNORMAL LOW (ref >60)
Glucose, Bld: 99 mg/dL (ref 70–99)
Potassium: 3.4 mmol/L — ABNORMAL LOW (ref 3.5–5.1)
Sodium: 138 mmol/L (ref 135–145)

## 2023-08-19 NOTE — Progress Notes (Signed)
 [facility]  [phone] [phone]  ID: Mia Perez KATHEE Lunger OB: 10-26-1933  MR#: [id number]  RDW#:[id number]  Patient Care Team: Dineen Rollene MATSU, FNP as PCP - General (Family Medicine) Cook, Jayce G, DO as Consulting Physician (Family Medicine) Jacobo Evalene PARAS, MD as Consulting Physician (Oncology)  CHIEF COMPLAINT: Elevated IgA level.  INTERVAL HISTORY: Patient is a 89 year old female who was noted to have a mildly elevated IgA level on routine blood work.  She currently feels well and is asymptomatic.  She has no neurologic complaints.  She denies any recent fevers or illnesses.  She has a good appetite and denies weight loss.  She has no pain.  She denies any chest pain, shortness of breath, cough, or hemoptysis.  She denies any nausea, vomiting, constipation, or diarrhea.  She has no urinary complaints.  Patient feels at her baseline and offers no specific complaints today.  REVIEW OF SYSTEMS:   Review of Systems  Constitutional: Negative.  Negative for fever, malaise/fatigue and weight loss.  Respiratory: Negative.  Negative for cough, hemoptysis and shortness of breath.   Cardiovascular: Negative.  Negative for chest pain and leg swelling.  Genitourinary: Negative.  Negative for dysuria and hematuria.  Musculoskeletal: Negative.  Negative for back pain.  Skin: Negative.  Negative for rash.  Neurological: Negative.  Negative for dizziness, seizures, weakness and headaches.  Psychiatric/Behavioral: Negative.  The patient is not nervous/anxious.     As per HPI. Otherwise, a complete review of systems is negative.  PAST MEDICAL HISTORY: Past Medical History:  Diagnosis Date   Acute otalgia, left 08/06/2020   Anemia    H/O   Arthritis    Breast cancer (HCC) 02/10/1996   left, lumpectomy and radiation   Breast cancer (HCC) 2017   right   CKD (chronic kidney disease), stage III (HCC)    COVID-19 03/30/2023   DVT (deep venous  thrombosis) (HCC) 11/2014   Right leg   GERD (gastroesophageal reflux disease)    Hypertension    Lymphocytic colitis    Personal history of radiation therapy 1998, 2017   BREAST CA   Ulcerative colitis (HCC) 2014   Varicose veins    Wears dentures    Full upper, partial lower    PAST SURGICAL HISTORY: Past Surgical History:  Procedure Laterality Date   BREAST EXCISIONAL BIOPSY Right 2014   NEG   BREAST LUMPECTOMY Left 1998   radiation   BREAST LUMPECTOMY Right 2017   BREAST LUMPECTOMY WITH SENTINEL LYMPH NODE BIOPSY Right 12/30/2015   8 mm, T1b, N0; RUOQ, ER 95%; PR: 80 %, Her 2 neu not overexpressed.Mammosite Radiation.   BREAST SURGERY Right 2014   Microcalcifications excised with wire localization/ Dr Dessa   CATARACT EXTRACTION W/ INTRAOCULAR LENS IMPLANT Left    approx 2010   CATARACT EXTRACTION W/PHACO Right 06/14/2023   Procedure: CATARACT EXTRACTION PHACO AND INTRAOCULAR LENS PLACEMENT (IOC) RIGHT 7.92, 00:47.5;  Surgeon: Myrna Adine Anes, MD;  Location: [facility];  Service: Ophthalmology;  Laterality: Right;   COLONOSCOPY  2012   Dr Luis   DILATION AND CURETTAGE OF UTERUS     HERNIA REPAIR     HYSTEROSCOPY     MELANOMA EXCISION     middle of chest between breasts, Dr Chrystie   NECK SURGERY     PERIPHERAL VASCULAR CATHETERIZATION N/A 12/13/2014   Procedure: IVC Filter Insertion;  Surgeon: Selinda GORMAN Gu, MD;  Location: [facility];  Service: Cardiovascular;  Laterality: N/A;  PERIPHERAL VASCULAR CATHETERIZATION  12/13/2014   Procedure: Lower Extremity Intervention;  Surgeon: Selinda GORMAN Gu, MD;  Location: [facility];  Service: Cardiovascular;;   PERIPHERAL VASCULAR CATHETERIZATION N/A 02/20/2015   Procedure: IVC Filter Removal;  Surgeon: Selinda GORMAN Gu, MD;  Location: [facility];  Service: Cardiovascular;  Laterality: N/A;    FAMILY HISTORY: Family History  Problem Relation Age of Onset   Heart disease Mother     Hypertension Mother    Ovarian cancer Mother 4   Hypertension Sister    Hypertension Brother    Ovarian cancer Paternal Aunt 34   Breast cancer Neg Hx     ADVANCED DIRECTIVES (Y/N):  N  HEALTH MAINTENANCE: Social History   Tobacco Use   Smoking status: Never    Passive exposure: Never   Smokeless tobacco: Never  Vaping Use   Vaping status: Never Used  Substance Use Topics   Alcohol use: No   Drug use: No     Colonoscopy:  PAP:  Bone density:  Lipid panel:  Allergies  Allergen Reactions   Pravachol  [Pravastatin ]     diarrhea   Penicillins Rash    Has patient had a PCN reaction causing immediate rash, facial/tongue/throat swelling, SOB or lightheadedness with hypotension: Yes Has patient had a PCN reaction causing severe rash involving mucus membranes or skin necrosis: Unknown Has patient had a PCN reaction that required hospitalization No Has patient had a PCN reaction occurring within the last 10 years: No If all of the above answers are NO, then may proceed with Cephalosporin use.     Current Outpatient Medications  Medication Sig Dispense Refill   amLODipine  (NORVASC ) 2.5 MG tablet Take 1 tablet (2.5 mg total) by mouth daily.     aspirin 325 MG tablet Take 325 mg by mouth daily.     budesonide  (ENTOCORT EC ) 3 MG 24 hr capsule TAKE 3 CAPSULES (9 MG TOTAL) BY MOUTH DAILY. 270 capsule 11   cholecalciferol (VITAMIN D ) 25 MCG (1000 UNIT) tablet Take 1,000 Units by mouth daily.     dorzolamide (TRUSOPT) 2 % ophthalmic solution 1 drop 2 (two) times daily.     dorzolamidel-timolol (COSOPT) 22.3-6.8 MG/ML SOLN ophthalmic solution Place 1 drop into both eyes 2 (two) times daily. Take one drop in both eyes in the morning & evening before bed.     ezetimibe  (ZETIA ) 10 MG tablet Take 1 tablet (10 mg total) by mouth daily. 90 tablet 3   hydrochlorothiazide  (MICROZIDE ) 12.5 MG capsule Take 1 capsule (12.5 mg total) by mouth daily.     losartan  (COZAAR ) 100 MG tablet TAKE 1  TABLET BY MOUTH EVERY DAY IN THE EVENING (100 MG) 90 tablet 1   LUMIGAN 0.01 % SOLN Place 1 drop into both eyes at bedtime.      metoprolol  succinate (TOPROL -XL) 100 MG 24 hr tablet TAKE 1 TABLET BY MOUTH WITH OR IMMEDIATELY FOLLOWING A MEAL. 90 tablet 3   Multiple Vitamins-Minerals (PRESERVISION AREDS 2 PO) Take 1 tablet by mouth 2 (two) times daily.      simethicone (MYLICON) 125 MG chewable tablet Chew 125 mg by mouth every morning.     Cyanocobalamin  (VITAMIN B-12) 5000 MCG TBDP Take 1,000 mcg by mouth daily. (Patient not taking: Reported on 08/19/2023)     fluticasone  (FLONASE ) 50 MCG/ACT nasal spray Place 2 sprays into both nostrils daily. (Patient not taking: Reported on 08/19/2023) 16 g 6   No current facility-administered medications for this visit.  OBJECTIVE: Vitals:   08/19/23 1108  BP: 132/70  Pulse: 61  Resp: 16  Temp: 98.2 F (36.8 C)  SpO2: 98%     Body mass index is 21.93 kg/m.    ECOG FS:0 - Asymptomatic  General: Well-developed, well-nourished, no acute distress. Eyes: Pink conjunctiva, anicteric sclera. HEENT: Normocephalic, moist mucous membranes. Lungs: No audible wheezing or coughing. Heart: Regular rate and rhythm. Abdomen: Soft, nontender, no obvious distention. Musculoskeletal: No edema, cyanosis, or clubbing. Neuro: Alert, answering all questions appropriately. Cranial nerves grossly intact. Skin: No rashes or petechiae noted. Psych: Normal affect. Lymphatics: No cervical, calvicular, axillary or inguinal LAD.   LAB RESULTS:  Lab Results  Component Value Date   NA 138 08/19/2023   K 3.4 (L) 08/19/2023   CL 106 08/19/2023   CO2 23 08/19/2023   GLUCOSE 99 08/19/2023   BUN 42 (H) 08/19/2023   CREATININE 1.50 (H) 08/19/2023   CALCIUM  8.9 08/19/2023   PROT 6.6 07/14/2023   ALBUMIN 3.5 05/10/2023   AST 10 05/10/2023   ALT 6 05/10/2023   ALKPHOS 103 05/10/2023   BILITOT 0.3 05/10/2023   GFRNONAA 34 (L) 08/19/2023   GFRAA 53 (L) 12/26/2015     Lab Results  Component Value Date   WBC 9.5 08/19/2023   NEUTROABS 6.3 08/19/2023   HGB 12.2 08/19/2023   HCT 38.2 08/19/2023   MCV 92.9 08/19/2023   PLT 312 08/19/2023     STUDIES: No results found.  ASSESSMENT: Elevated IgA level.  PLAN:    Elevated IgA level: Patient previously noted to have a mildly elevated IgA level of 430, but her most recent laboratory work it is now within normal limits at 386.  She does not have an M spike on her SPEP.  Both kappa free light chains and lambda free light chains are elevated, but her ratio is within normal limits.  She has a mild renal insufficiency, but no other evidence of endorgan damage.  No intervention is needed.  Patient does not require bone marrow biopsy.  Return to clinic in 3 months with repeat laboratory work and further evaluation.  If her lab results continue to remain within normal limits, she likely can be discharged from clinic. Renal insufficiency: Chronic and unchanged.  Patient's creatinine is 1.50 today.  She has had a mildly increased creatinine level since at least November 2016.    I spent a total of 45 minutes reviewing chart data, face-to-face evaluation with the patient, counseling and coordination of care as detailed above.   Patient expressed understanding and was in agreement with this plan. She also understands that She can call clinic at any time with any questions, concerns, or complaints.    Evalene JINNY Reusing, MD   08/20/2023 4:38 PM

## 2023-08-20 LAB — KAPPA/LAMBDA LIGHT CHAINS
Kappa free light chain: 49.5 mg/L — ABNORMAL HIGH (ref 3.3–19.4)
Kappa, lambda light chain ratio: 1.59 (ref 0.26–1.65)
Lambda free light chains: 31.1 mg/L — ABNORMAL HIGH (ref 5.7–26.3)

## 2023-08-20 LAB — IGG, IGA, IGM
IgA: 386 mg/dL (ref 64–422)
IgG (Immunoglobin G), Serum: 883 mg/dL (ref 586–1602)
IgM (Immunoglobulin M), Srm: 40 mg/dL (ref 26–217)

## 2023-08-23 LAB — PROTEIN ELECTROPHORESIS, SERUM
A/G Ratio: 1.2 (ref 0.7–1.7)
Albumin ELP: 3.6 g/dL (ref 2.9–4.4)
Alpha-1-Globulin: 0.2 g/dL (ref 0.0–0.4)
Alpha-2-Globulin: 0.8 g/dL (ref 0.4–1.0)
Beta Globulin: 1.2 g/dL (ref 0.7–1.3)
Gamma Globulin: 0.8 g/dL (ref 0.4–1.8)
Globulin, Total: 3 g/dL (ref 2.2–3.9)
M-Spike, %: 0.2 g/dL — ABNORMAL HIGH
Total Protein ELP: 6.6 g/dL (ref 6.0–8.5)

## 2023-09-02 DIAGNOSIS — H353132 Nonexudative age-related macular degeneration, bilateral, intermediate dry stage: Secondary | ICD-10-CM | POA: Diagnosis not present

## 2023-09-02 DIAGNOSIS — H26492 Other secondary cataract, left eye: Secondary | ICD-10-CM | POA: Diagnosis not present

## 2023-09-13 ENCOUNTER — Encounter: Payer: Self-pay | Admitting: Surgery

## 2023-09-13 ENCOUNTER — Ambulatory Visit (INDEPENDENT_AMBULATORY_CARE_PROVIDER_SITE_OTHER): Admitting: Surgery

## 2023-09-13 VITALS — BP 171/82 | HR 79 | Temp 97.7°F | Ht 67.0 in | Wt 140.4 lb

## 2023-09-13 DIAGNOSIS — N6031 Fibrosclerosis of right breast: Secondary | ICD-10-CM | POA: Diagnosis not present

## 2023-09-13 DIAGNOSIS — N6311 Unspecified lump in the right breast, upper outer quadrant: Secondary | ICD-10-CM

## 2023-09-13 DIAGNOSIS — N6001 Solitary cyst of right breast: Secondary | ICD-10-CM | POA: Diagnosis not present

## 2023-09-13 DIAGNOSIS — N641 Fat necrosis of breast: Secondary | ICD-10-CM | POA: Diagnosis not present

## 2023-09-13 DIAGNOSIS — N6310 Unspecified lump in the right breast, unspecified quadrant: Secondary | ICD-10-CM | POA: Diagnosis not present

## 2023-09-13 MED ORDER — HYDROCODONE-ACETAMINOPHEN 5-325 MG PO TABS: 1.0000 | ORAL_TABLET | Freq: Four times a day (QID) | ORAL | 0 refills | Status: DC | PRN

## 2023-09-13 MED ORDER — DOXYCYCLINE HYCLATE 100 MG PO CAPS: 100.0000 mg | ORAL_CAPSULE | Freq: Two times a day (BID) | ORAL | 0 refills | Status: AC

## 2023-09-13 NOTE — Progress Notes (Signed)
  Procedure Date:  09/13/2023  Pre-operative Diagnosis:  Right breast sebaceous cyst  Post-operative Diagnosis:  Right breast sebaceous cyst (2 cm), right breast mass  Procedure:  Excision of right breast sebaceous cyst and incisional biopsy of right breast mass; layered closure of 4 cm incision.  Surgeon:  Aloysius Sheree Plant, MD  Anesthesia:  20 ml 1% lidocaine  with epi  Estimated Blood Loss:  10 ml  Specimens:   Right breast sebaceous cyst Right breast mass  Complications:  None  Indications for Procedure:  This is a 89 y.o. female with diagnosis of a symptomatic right breast sebaceous cyst, which has continued to drain.  The patient wishes to have this excised. The risks of bleeding, abscess or infection, injury to surrounding structures, and need for further procedures were all discussed with the patient and she was willing to proceed.  Description of Procedure: The patient was correctly identified at bedside.  The patient was placed supine.  Appropriate time-outs were performed.  The patient's right breast was prepped and draped in usual sterile fashion.  Local anesthetic was infused intradermally.  A 4 cm elliptical incision was made over the cyst, and scalpel was used to dissect down the skin and subcutaneous tissue.  Skin flaps were created sharply, and as dissection was carried deeply, it appeared that the cyst was connected to a deeper structure.  This appears to be the prior lumpectomy site from her breast cancer.  There was palpable firm tissue likely scar tissue leading to a small lumpectomy cavity.  It was unclear if the cyst at the skin level was directly connected to this or if these were truly separate findings that abutted each other.  As a precaution, after excising the cyst area, I excised part of the lumpectomy cavity using scalpel to send for pathology analysis.  Once this was completed, the cavity was irrigated and then closed in multiple layers using 2-0 Vicryl, 3-0  Vicryl and 4-0 Monocryl.  The incision was cleaned and sealed with DermaBond.  The patient tolerated the procedure well and all sharps were appropriately disposed of at the end of the case.  --Patient may shower tomorrow. --Discussed activity restrictions. --May take Tylenol  for pain control and given increased size and depth of excision today, will also send prescription for Vicodin. --Follow up next week.   Aloysius Sheree Plant, MD

## 2023-09-13 NOTE — Patient Instructions (Signed)
 We have removed a Cyst in our office today.  You have sutures under the skin that will dissolve and also dermabond (skin glue) on top of your skin which will come off on it's own in 10-14 days.  You may use Ibuprofen or Tylenol  as needed for pain control. Use the ice pack 3-4 times a day for the next two days for any achiness.  You may shower 09/14/23. Do not scrub at the area.   Avoid Strenuous activities that will make you sweat during the next 48 hours to avoid the glue coming off prematurely. Avoid activities that will place pressure to this area of the body for 1-2 weeks to avoid re-injury to incision site.  Please see your follow-up appointment provided. We will see you back in office to make sure this area is healed and to review the final pathology. If you have any questions or concerns prior to this appointment, call our office and speak with a nurse.    Excision of Skin Cysts or Lesions Excision of a skin lesion refers to the removal of a section of skin by making small cuts (incisions) in the skin. This procedure may be done to remove a cancerous (malignant) or noncancerous (benign) growth on the skin. It is typically done to treat or prevent cancer or infection. It may also be done to improve cosmetic appearance. The procedure may be done to remove: Cancerous growths, such as basal cell carcinoma, squamous cell carcinoma, or melanoma. Noncancerous growths, such as a cyst or lipoma. Growths, such as moles or skin tags, which may be removed for cosmetic reasons.  Various excision or surgical techniques may be used depending on your condition, the location of the lesion, and your overall health. Tell a health care provider about: Any allergies you have. All medicines you are taking, including vitamins, herbs, eye drops, creams, and over-the-counter medicines. Any problems you or family members have had with anesthetic medicines. Any blood disorders you have. Any surgeries you have  had. Any medical conditions you have. Whether you are pregnant or may be pregnant. What are the risks? Generally, this is a safe procedure. However, problems may occur, including: Bleeding. Infection. Scarring. Recurrence of the cyst, lipoma, or cancer. Changes in skin sensation or appearance, such as discoloration or swelling. Reaction to the anesthetics. Allergic reaction to surgical materials or ointments. Damage to nerves, blood vessels, muscles, or other structures. Continued pain.  What happens before the procedure? Ask your health care provider about: Changing or stopping your regular medicines. This is especially important if you are taking diabetes medicines or blood thinners. Taking medicines such as aspirin and ibuprofen. These medicines can thin your blood. Do not take these medicines before your procedure if your health care provider instructs you not to. You may be asked to take certain medicines. You may be asked to stop smoking. You may have an exam or testing. What happens during the procedure? To reduce your risk of infection: Your health care team will wash or sanitize their hands. Your skin will be washed with soap. You will be given a medicine to numb the area (local anesthetic). One of the following excision techniques will be performed. At the end of any of these procedures, antibiotic ointment will be applied as needed. Each of the following techniques may vary among health care providers and hospitals. Complete Surgical Excision The area of skin that needs to be removed will be marked with a pen. Using a small scalpel or scissors, the  surgeon will gently cut around and under the lesion until it is completely removed. The lesion will be placed in a fluid and sent to the lab for examination. If necessary, bleeding will be controlled with a device that delivers heat (electrocautery). The edges of the wound may be stitched (sutured) together, and a bandage  (dressing) or surgical glue will be applied. This procedure may be performed to treat a cancerous growth or a noncancerous cyst or lesion.  What happens after the procedure? Return to your normal activities as told by your health care provider. Report any excessive bleeding, spreading redness, or increased pain.

## 2023-09-15 ENCOUNTER — Ambulatory Visit: Payer: Self-pay | Admitting: Surgery

## 2023-09-15 LAB — SURGICAL PATHOLOGY

## 2023-09-15 NOTE — Progress Notes (Signed)
 09/15/23 Called patient to discuss pathology results given the more extensive resection that was needed on 09/13/23.  The are of the skin cyst showed ulceration and inflammatory changes, and the deeper area where her prior lumpectomy was done only showed benign tissue with extensive fat necrosis and fibrosis, but without any atypia or malignancy.  Patient is doing well so far, with some expected soreness.  Follow up on 09/20/23.  Aloysius Plant, MD

## 2023-09-20 ENCOUNTER — Ambulatory Visit (INDEPENDENT_AMBULATORY_CARE_PROVIDER_SITE_OTHER): Admitting: Surgery

## 2023-09-20 ENCOUNTER — Encounter: Payer: Self-pay | Admitting: Surgery

## 2023-09-20 VITALS — BP 138/72 | HR 73 | Temp 97.7°F | Ht 67.0 in | Wt 138.0 lb

## 2023-09-20 DIAGNOSIS — Z09 Encounter for follow-up examination after completed treatment for conditions other than malignant neoplasm: Secondary | ICD-10-CM

## 2023-09-20 DIAGNOSIS — N6001 Solitary cyst of right breast: Secondary | ICD-10-CM

## 2023-09-20 NOTE — Patient Instructions (Signed)
 Breast Cyst Removal  Breast cyst removal is a surgery to remove a fluid-filled sac (cyst) in your breast. A breast cyst is usually smooth, round, and painless. However, a breast cyst can often change in size or become tender, especially during your menstrual periods. Breast cysts are usually noncancerous (benign). You may have this surgery if a breast cyst comes back (recurs) after having an earlier procedure to drain it using a needle inserted into the cyst (fine-needle aspiration). Tell your health care provider about: Any allergies you have. All medicines you are taking, including vitamins, herbs, eye drops, creams, and over-the-counter medicines. Any problems you or family members have had with anesthetic medicines. Any surgeries you have had. Any bleeding problems or other medical conditions you have. Whether you are pregnant or may be pregnant. Any past history of breast cysts. If you have a family history of breast cancer. What are the risks? Generally, this is a safe procedure. However, problems may occur, including: Bleeding. Infection. Allergic reactions to medicines. Scarring. Recurrence of the cyst. What happens before the surgery? Medicines Ask your health care provider about: Changing or stopping your regular medicines. These include any diabetes medicines or blood thinners you take. Taking medicines such as aspirin and ibuprofen. These medicines can thin your blood. Do not take them unless your health care provider tells you to. Taking over-the-counter medicines, vitamins, herbs, and supplements. General instructions Follow instructions from your health care provider about what you may eat and drink. You may have an ultrasound of your breast to diagnose the cyst. A mark may be made over the skin to indicate the location of the cyst. Ask your health care provider what steps will be taken to help prevent infection. These may include washing skin with a soap that kills  germs. If you will be going home right after the procedure, plan to have a responsible adult: Take you home from the hospital or clinic. You will not be allowed to drive. Care for you for the time you are told. What happens during the surgery? An IV will be inserted into one of your veins. You may be given: A sedative. This helps you relax. Anesthesia. This will: Numb certain areas of your body. Make you fall asleep for surgery. Your surgeon will make a small incision over the cyst. Breast tissue will be separated from around the cyst. Your surgeon will remove the cyst. The incision will be closed with stitches (sutures) or adhesive strips. A bandage (dressing) will be placed over the incision. The procedure may vary among health care providers and hospitals. What happens after the surgery? Your blood pressure, heart rate, breathing rate, and blood oxygen level will be monitored until you leave the hospital or clinic. If you were given a sedative during the procedure, it can affect you for several hours. Do not drive or operate machinery until your health care provider says that it is safe. Summary Breast cyst removal is surgery to remove a fluid-filled sac (cyst) in your breast. Breast cysts are usually noncancerous (benign). You may have this surgery if a breast cyst comes back (recurs) after having an earlier procedure to drain it using a needle inserted into the cyst (fine-needle aspiration). Follow instructions from your health care provider about what you may eat and drink before the procedure. This information is not intended to replace advice given to you by your health care provider. Make sure you discuss any questions you have with your health care provider. Document Revised: 04/09/2021 Document  Reviewed: 04/09/2021 Elsevier Patient Education  2024 [employer].

## 2023-09-20 NOTE — Progress Notes (Signed)
 09/20/2023  HPI: Mia Perez is a 89 y.o. female s/p excision of right breast sebaceous cyst with excision of deeper right breast mass on 8//25.  Patient presents today for follow-up.  Final pathology showed the area of cyst to be an ulcerated area and deeper down residual from her lumpectomy with fat necrosis but no evidence of malignancy.  Patient is doing well.  Reports that she had a little bit of drainage but otherwise has stopped.  Vital signs: BP 138/72   Pulse 73   Temp 97.7 F (36.5 C) (Oral)   Ht 5' 7 (1.702 m)   Wt 138 lb (62.6 kg)   SpO2 96%   BMI 21.61 kg/m    Physical Exam: Constitutional: No acute distress Breast: Right breast incision is healing appropriately and is clean, dry, intact.  Dermabond still in place.  No active drainage noted.  Mild erythema likely from irritation from surgery but otherwise no evidence of infection at this point.  Assessment/Plan: This is a 89 y.o. female s/p excision of right breast sebaceous cyst with excision of deeper right breast mass.  - Reviewed with her again pathology results all showing benign tissue without evidence of malignancy. - Discussed with patient that the incision itself appears to be well sealed and at this point there is no active drainage noted.  Dermabond is still in place without any leaks.  Discussed with patient this will continue to heal on its own. - Follow-up in 2 weeks to check on her wound.   Aloysius Sheree Plant, MD [facility]

## 2023-09-30 DIAGNOSIS — H353132 Nonexudative age-related macular degeneration, bilateral, intermediate dry stage: Secondary | ICD-10-CM | POA: Diagnosis not present

## 2023-09-30 DIAGNOSIS — H3530 Unspecified macular degeneration: Secondary | ICD-10-CM | POA: Diagnosis not present

## 2023-10-04 ENCOUNTER — Encounter: Payer: Self-pay | Admitting: Surgery

## 2023-10-04 ENCOUNTER — Ambulatory Visit: Admitting: Surgery

## 2023-10-04 VITALS — BP 137/75 | HR 58 | Temp 97.8°F | Ht 67.0 in | Wt 142.0 lb

## 2023-10-04 DIAGNOSIS — L723 Sebaceous cyst: Secondary | ICD-10-CM

## 2023-10-04 DIAGNOSIS — Z09 Encounter for follow-up examination after completed treatment for conditions other than malignant neoplasm: Secondary | ICD-10-CM

## 2023-10-04 DIAGNOSIS — L089 Local infection of the skin and subcutaneous tissue, unspecified: Secondary | ICD-10-CM

## 2023-10-04 DIAGNOSIS — N6001 Solitary cyst of right breast: Secondary | ICD-10-CM

## 2023-10-04 NOTE — Patient Instructions (Addendum)
 Please call the office if you have any questions or concerns. If any symptoms arise please call the office to let us  know   The glue will continue to peel off.

## 2023-10-04 NOTE — Progress Notes (Signed)
 10/04/2023  HPI: Mia Perez is a 89 y.o. female s/p excision of right breast cyst including part of prior lumpectomy cavity on 09/13/23.  Patient presents for follow up.  She has been doing well and denies any issues with the incision or pain.  No further drainage.  Vital signs: BP 137/75   Pulse (!) 58   Temp 97.8 F (36.6 C) (Oral)   Ht 5' 7 (1.702 m)   Wt 142 lb (64.4 kg)   SpO2 94%   BMI 22.24 kg/m    Physical Exam: Constitutional:  No acute distress Breast:  Right breast incision in upper outer quadrant is healing well, clean, dry, intact, without any evidence of infection or dehiscence.  DermaBond peeling off.  Assessment/Plan: This is a 89 y.o. female s/p excision of right breast cyst and lumpectomy cavity.  --Patient is recovering well and denies any issues at this point.  No complications noted from the procedure 3 weeks ago.  Discussed again pathology results showing benign findings. --Follow up as needed.   Aloysius Sheree Plant, MD [facility]

## 2023-10-18 ENCOUNTER — Ambulatory Visit: Admitting: Family

## 2023-10-18 ENCOUNTER — Encounter: Payer: Self-pay | Admitting: Family

## 2023-10-18 VITALS — BP 138/62 | HR 65 | Temp 98.1°F | Resp 20 | Ht 67.0 in | Wt 141.0 lb

## 2023-10-18 DIAGNOSIS — I10 Essential (primary) hypertension: Secondary | ICD-10-CM | POA: Diagnosis not present

## 2023-10-18 DIAGNOSIS — Z23 Encounter for immunization: Secondary | ICD-10-CM | POA: Diagnosis not present

## 2023-10-18 DIAGNOSIS — E785 Hyperlipidemia, unspecified: Secondary | ICD-10-CM

## 2023-10-18 MED ORDER — LOSARTAN POTASSIUM 100 MG PO TABS: ORAL_TABLET | ORAL | 3 refills | Status: AC

## 2023-10-18 MED ORDER — EZETIMIBE 10 MG PO TABS: 10.0000 mg | ORAL_TABLET | Freq: Every day | ORAL | 3 refills | Status: AC

## 2023-10-18 NOTE — Progress Notes (Unsigned)
 Assessment & Plan:  Essential hypertension Assessment & Plan: Chronic, stable.  Continue amlodipine  2.5 mg every morning ( reduced dose) losartan  100 mg every night,   toprol  100 mg ,hctz to 12.5 mg every day.  Leg swelling has improved.  Due to history of labile blood pressure, we did discuss that she may take an additional dose of hydrochlorothiazide  12.5mg  if blood pressure > 140/80.   No longer on hydralazine .  Will monitor   Orders: -     Losartan  Potassium; TAKE 1 TABLET BY MOUTH EVERY DAY IN THE EVENING (100 MG)  Dispense: 90 tablet; Refill: 3  Need for influenza vaccination -     Flu vaccine HIGH DOSE PF(Fluzone Trivalent)  Hyperlipidemia, unspecified hyperlipidemia type -     Ezetimibe ; Take 1 tablet (10 mg total) by mouth daily.  Dispense: 90 tablet; Refill: 3     Return precautions given.   Risks, benefits, and alternatives of the medications and treatment plan prescribed today were discussed, and patient expressed understanding.   Education regarding symptom management and diagnosis given to patient on AVS either electronically or printed.  Return in about 3 months (around 01/17/2024).  Rollene Northern, FNP  Subjective:    [name]: Mia Perez, female    DOB: 20-May-1933, 89 y.o.   MRN: [id number]  CC: Mia Perez is a 89 y.o. female who presents today for follow up.   HPI: Feels well today.  No new complaints.  She is moved to [facility].  She is adjusting to this and has been happy so far.  Compliant with amlodipine  2.5mg  every day, losartan  100 mg every night,   toprol  100 mg , hctz to 12.5 mg every day for blood pressure control and periodic BLE.  Denies cp, sob.   Leg swelling improved. She is wearing compression stockings.   No longer on hydralazine .        Allergies: Pravachol  [pravastatin ] and Penicillins Current Outpatient Medications on File Prior to Visit  Medication Sig Dispense Refill   amLODipine  (NORVASC )  2.5 MG tablet Take 1 tablet (2.5 mg total) by mouth daily.     aspirin 325 MG tablet Take 325 mg by mouth daily.     budesonide  (ENTOCORT EC ) 3 MG 24 hr capsule TAKE 3 CAPSULES (9 MG TOTAL) BY MOUTH DAILY. 270 capsule 11   cholecalciferol (VITAMIN D ) 25 MCG (1000 UNIT) tablet Take 1,000 Units by mouth daily.     dorzolamide (TRUSOPT) 2 % ophthalmic solution 1 drop 2 (two) times daily.     dorzolamidel-timolol (COSOPT) 22.3-6.8 MG/ML SOLN ophthalmic solution Place 1 drop into both eyes 2 (two) times daily. Take one drop in both eyes in the morning & evening before bed.     fluticasone  (FLONASE ) 50 MCG/ACT nasal spray Place 2 sprays into both nostrils daily. 16 g 6   hydrochlorothiazide  (MICROZIDE ) 12.5 MG capsule Take 1 capsule (12.5 mg total) by mouth daily.     LUMIGAN 0.01 % SOLN Place 1 drop into both eyes at bedtime.      metoprolol  succinate (TOPROL -XL) 100 MG 24 hr tablet TAKE 1 TABLET BY MOUTH WITH OR IMMEDIATELY FOLLOWING A MEAL. 90 tablet 3   Multiple Vitamins-Minerals (PRESERVISION AREDS 2 PO) Take 1 tablet by mouth 2 (two) times daily.      simethicone (MYLICON) 125 MG chewable tablet Chew 125 mg by mouth every morning.     No current facility-administered medications on file prior to visit.  Review of Systems  Constitutional:  Negative for chills and fever.  Respiratory:  Negative for cough.   Cardiovascular:  Negative for chest pain and palpitations.  Gastrointestinal:  Negative for nausea and vomiting.      Objective:    BP 138/62   Pulse 65   Temp 98.1 F (36.7 C)   Resp 20   Ht 5' 7 (1.702 m)   Wt 141 lb (64 kg)   SpO2 98%   BMI 22.08 kg/m  BP Readings from Last 3 Encounters:  10/18/23 138/62  10/04/23 137/75  09/20/23 138/72   Wt Readings from Last 3 Encounters:  10/18/23 141 lb (64 kg)  10/04/23 142 lb (64.4 kg)  09/20/23 138 lb (62.6 kg)    Physical Exam Vitals reviewed.  Constitutional:      Appearance: She is well-developed.  Eyes:      Conjunctiva/sclera: Conjunctivae normal.  Cardiovascular:     Rate and Rhythm: Normal rate and regular rhythm.     Pulses: Normal pulses.     Heart sounds: Normal heart sounds.  Pulmonary:     Effort: Pulmonary effort is normal.     Breath sounds: Normal breath sounds. No wheezing, rhonchi or rales.  Skin:    General: Skin is warm and dry.  Neurological:     Mental Status: She is alert.  Psychiatric:        Speech: Speech normal.        Behavior: Behavior normal.        Thought Content: Thought content normal.

## 2023-10-18 NOTE — Patient Instructions (Addendum)
 May take an additional dose of hydrochlorothiazide  12.5mg  if blood pressure > 140/80.   I think you are doing wonderful. Please let me know of any concerns.  Managing Your Hypertension Hypertension, also called high blood pressure, is when the force of the blood pressing against the walls of the arteries is too strong. Arteries are blood vessels that carry blood from your heart throughout your body. Hypertension forces the heart to work harder to pump blood and may cause the arteries to become narrow or stiff. Understanding blood pressure readings A blood pressure reading includes a higher number over a lower number: The first, or top, number is called the systolic pressure. It is a measure of the pressure in your arteries as your heart beats. The second, or bottom number, is called the diastolic pressure. It is a measure of the pressure in your arteries as the heart relaxes. For most people, a normal blood pressure is below 120/80. Your personal target blood pressure may vary depending on your medical conditions, your age, and other factors. Blood pressure is classified into four stages. Based on your blood pressure reading, your health care provider may use the following stages to determine what type of treatment you need, if any. Systolic pressure and diastolic pressure are measured in a unit called millimeters of mercury (mmHg). Normal Systolic pressure: below 120. Diastolic pressure: below 80. Elevated Systolic pressure: 120-129. Diastolic pressure: below 80. Hypertension stage 1 Systolic pressure: 130-139. Diastolic pressure: 80-89. Hypertension stage 2 Systolic pressure: 140 or above. Diastolic pressure: 90 or above. How can this condition affect me? Managing your hypertension is very important. Over time, hypertension can damage the arteries and decrease blood flow to parts of the body, including the brain, heart, and kidneys. Having untreated or uncontrolled hypertension can lead  to: A heart attack. A stroke. A weakened blood vessel (aneurysm). Heart failure. Kidney damage. Eye damage. Memory and concentration problems. Vascular dementia. What actions can I take to manage this condition? Hypertension can be managed by making lifestyle changes and possibly by taking medicines. Your health care provider will help you make a plan to bring your blood pressure within a normal range. You may be referred for counseling on a healthy diet and physical activity. Nutrition  Eat a diet that is high in fiber and potassium, and low in salt (sodium), added sugar, and fat. An example eating plan is called the DASH diet. DASH stands for Dietary Approaches to Stop Hypertension. To eat this way: Eat plenty of fresh fruits and vegetables. Try to fill one-half of your plate at each meal with fruits and vegetables. Eat whole grains, such as whole-wheat pasta, brown rice, or whole-grain bread. Fill about one-fourth of your plate with whole grains. Eat low-fat dairy products. Avoid fatty cuts of meat, processed or cured meats, and poultry with skin. Fill about one-fourth of your plate with lean proteins such as fish, chicken without skin, beans, eggs, and tofu. Avoid pre-made and processed foods. These tend to be higher in sodium, added sugar, and fat. Reduce your daily sodium intake. Many people with hypertension should eat less than 1,500 mg of sodium a day. Lifestyle  Work with your health care provider to maintain a healthy body weight or to lose weight. Ask what an ideal weight is for you. Get at least 30 minutes of exercise that causes your heart to beat faster (aerobic exercise) most days of the week. Activities may include walking, swimming, or biking. Include exercise to strengthen your muscles (resistance  exercise), such as weight lifting, as part of your weekly exercise routine. Try to do these types of exercises for 30 minutes at least 3 days a week. Do not use any products that  contain nicotine or tobacco. These products include cigarettes, chewing tobacco, and vaping devices, such as e-cigarettes. If you need help quitting, ask your health care provider. Control any long-term (chronic) conditions you have, such as high cholesterol or diabetes. Identify your sources of stress and find ways to manage stress. This may include meditation, deep breathing, or making time for fun activities. Alcohol use Do not drink alcohol if: Your health care provider tells you not to drink. You are pregnant, may be pregnant, or are planning to become pregnant. If you drink alcohol: Limit how much you have to: 0-1 drink a day for women. 0-2 drinks a day for men. Know how much alcohol is in your drink. In the [place] one drink equals one 12 oz bottle of beer (355 mL), one 5 oz glass of wine (148 mL), or one 1 oz glass of hard liquor (44 mL). Medicines Your health care provider may prescribe medicine if lifestyle changes are not enough to get your blood pressure under control and if: Your systolic blood pressure is 130 or higher. Your diastolic blood pressure is 80 or higher. Take medicines only as told by your health care provider. Follow the directions carefully. Blood pressure medicines must be taken as told by your health care provider. The medicine does not work as well when you skip doses. Skipping doses also puts you at risk for problems. Monitoring Before you monitor your blood pressure: Do not smoke, drink caffeinated beverages, or exercise within 30 minutes before taking a measurement. Use the bathroom and empty your bladder (urinate). Sit quietly for at least 5 minutes before taking measurements. Monitor your blood pressure at home as told by your health care provider. To do this: Sit with your back straight and supported. Place your feet flat on the floor. Do not cross your legs. Support your arm on a flat surface, such as a table. Make sure your upper arm is at heart  level. Each time you measure, take two or three readings one minute apart and record the results. You may also need to have your blood pressure checked regularly by your health care provider. General information Talk with your health care provider about your diet, exercise habits, and other lifestyle factors that may be contributing to hypertension. Review all the medicines you take with your health care provider because there may be side effects or interactions. Keep all follow-up visits. Your health care provider can help you create and adjust your plan for managing your high blood pressure. Where to find more information National Heart, Lung, and Blood Institute: [employer].is American Heart Association: www.heart.org Contact a health care provider if: You think you are having a reaction to medicines you have taken. You have repeated (recurrent) headaches. You feel dizzy. You have swelling in your ankles. You have trouble with your vision. Get help right away if: You develop a severe headache or confusion. You have unusual weakness or numbness, or you feel faint. You have severe pain in your chest or abdomen. You vomit repeatedly. You have trouble breathing. These symptoms may be an emergency. Get help right away. Call 911. Do not wait to see if the symptoms will go away. Do not drive yourself to the hospital. Summary Hypertension is when the force of blood pumping through your arteries is  too strong. If this condition is not controlled, it may put you at risk for serious complications. Your personal target blood pressure may vary depending on your medical conditions, your age, and other factors. For most people, a normal blood pressure is less than 120/80. Hypertension is managed by lifestyle changes, medicines, or both. Lifestyle changes to help manage hypertension include losing weight, eating a healthy, low-sodium diet, exercising more, stopping smoking, and limiting  alcohol. This information is not intended to replace advice given to you by your health care provider. Make sure you discuss any questions you have with your health care provider. Document Revised: 10/10/2020 Document Reviewed: 10/10/2020 Elsevier Patient Education  2024 [employer].

## 2023-10-21 NOTE — Assessment & Plan Note (Addendum)
 Chronic, stable.  Continue amlodipine  2.5 mg every morning ( reduced dose) losartan  100 mg every night,   toprol  100 mg ,hctz to 12.5 mg every day.  Leg swelling has improved.  Due to history of labile blood pressure, we did discuss that she may take an additional dose of hydrochlorothiazide  12.5mg  if blood pressure > 140/80.   No longer on hydralazine .  Will monitor

## 2023-10-26 ENCOUNTER — Emergency Department

## 2023-10-26 ENCOUNTER — Emergency Department
Admission: EM | Admit: 2023-10-26 | Discharge: 2023-10-26 | Disposition: A | Discharge status: Home / Self Care | Attending: Emergency Medicine | Admitting: Emergency Medicine

## 2023-10-26 ENCOUNTER — Other Ambulatory Visit: Payer: Self-pay

## 2023-10-26 ENCOUNTER — Ambulatory Visit: Payer: Self-pay

## 2023-10-26 ENCOUNTER — Ambulatory Visit
Admission: EM | Admit: 2023-10-26 | Discharge: 2023-10-26 | Disposition: A | Discharge status: Home / Self Care | Attending: Emergency Medicine | Admitting: Emergency Medicine

## 2023-10-26 DIAGNOSIS — N189 Chronic kidney disease, unspecified: Secondary | ICD-10-CM | POA: Insufficient documentation

## 2023-10-26 DIAGNOSIS — Z86718 Personal history of other venous thrombosis and embolism: Secondary | ICD-10-CM | POA: Insufficient documentation

## 2023-10-26 DIAGNOSIS — I129 Hypertensive chronic kidney disease with stage 1 through stage 4 chronic kidney disease, or unspecified chronic kidney disease: Secondary | ICD-10-CM | POA: Diagnosis not present

## 2023-10-26 DIAGNOSIS — R030 Elevated blood-pressure reading, without diagnosis of hypertension: Secondary | ICD-10-CM | POA: Diagnosis present

## 2023-10-26 DIAGNOSIS — E876 Hypokalemia: Secondary | ICD-10-CM | POA: Diagnosis not present

## 2023-10-26 DIAGNOSIS — R519 Headache, unspecified: Secondary | ICD-10-CM | POA: Diagnosis not present

## 2023-10-26 DIAGNOSIS — I10 Essential (primary) hypertension: Secondary | ICD-10-CM | POA: Diagnosis not present

## 2023-10-26 DIAGNOSIS — I160 Hypertensive urgency: Secondary | ICD-10-CM

## 2023-10-26 DIAGNOSIS — R5381 Other malaise: Secondary | ICD-10-CM | POA: Diagnosis not present

## 2023-10-26 DIAGNOSIS — Z79899 Other long term (current) drug therapy: Secondary | ICD-10-CM | POA: Diagnosis not present

## 2023-10-26 DIAGNOSIS — R42 Dizziness and giddiness: Secondary | ICD-10-CM

## 2023-10-26 LAB — CBC WITH DIFFERENTIAL/PLATELET
Abs Immature Granulocytes: 0.05 K/uL (ref 0.00–0.07)
Basophils Absolute: 0 K/uL (ref 0.0–0.1)
Basophils Relative: 0 %
Eosinophils Absolute: 0.1 K/uL (ref 0.0–0.5)
Eosinophils Relative: 1 %
HCT: 39.6 % (ref 36.0–46.0)
Hemoglobin: 12.8 g/dL (ref 12.0–15.0)
Immature Granulocytes: 1 %
Lymphocytes Relative: 14 %
Lymphs Abs: 1.2 K/uL (ref 0.7–4.0)
MCH: 29.4 pg (ref 26.0–34.0)
MCHC: 32.3 g/dL (ref 30.0–36.0)
MCV: 91 fL (ref 80.0–100.0)
Monocytes Absolute: 0.4 K/uL (ref 0.1–1.0)
Monocytes Relative: 5 %
Neutro Abs: 7.2 K/uL (ref 1.7–7.7)
Neutrophils Relative %: 79 %
Platelets: 339 K/uL (ref 150–400)
RBC: 4.35 MIL/uL (ref 3.87–5.11)
RDW: 14.7 % (ref 11.5–15.5)
WBC: 9.1 K/uL (ref 4.0–10.5)
nRBC: 0 % (ref 0.0–0.2)

## 2023-10-26 LAB — BASIC METABOLIC PANEL WITH GFR
Anion gap: 12 (ref 5–15)
BUN: 32 mg/dL — ABNORMAL HIGH (ref 8–23)
CO2: 21 mmol/L — ABNORMAL LOW (ref 22–32)
Calcium: 9.1 mg/dL (ref 8.9–10.3)
Chloride: 105 mmol/L (ref 98–111)
Creatinine, Ser: 1.42 mg/dL — ABNORMAL HIGH (ref 0.44–1.00)
GFR, Estimated: 36 mL/min — ABNORMAL LOW (ref >60)
Glucose, Bld: 128 mg/dL — ABNORMAL HIGH (ref 70–99)
Potassium: 3.2 mmol/L — ABNORMAL LOW (ref 3.5–5.1)
Sodium: 138 mmol/L (ref 135–145)

## 2023-10-26 MED ORDER — POTASSIUM CHLORIDE CRYS ER 20 MEQ PO TBCR
40.0000 meq | EXTENDED_RELEASE_TABLET | ORAL | Status: AC
  Administered 2023-10-26: 40 meq via ORAL
  Filled 2023-10-26: qty 2

## 2023-10-26 NOTE — ED Notes (Signed)
 Pt reports elevated blood pressure for several weeks.  Pt states blood pressure more elevated yesterday and today.     Pt states head hurts from not eating lunch or dinner today.  Pt alert   speech clear.  Pt in hallway bed.

## 2023-10-26 NOTE — ED Triage Notes (Addendum)
 Patient to Urgent Care with complaints of elevated blood pressure readings at home today. Reports reading as high as 180/84.   Taking BP meds as prescribed (1130am took metoprolol ). Reports she feels bad over all.

## 2023-10-26 NOTE — Telephone Encounter (Signed)
 FYI Only or Action Required?: Action required by provider: clinical question for provider and update on patient condition.  Patient was last seen in primary care on 10/18/2023 by Dineen Rollene MATSU, FNP.  Called Nurse Triage reporting Hypertension.  Symptoms began today.  Interventions attempted: Prescription medications: as prescribed and dose of hydralazine  that has been discontinued .  Symptoms are: unchanged.  Triage Disposition: See Physician Within 24 Hours  Patient/caregiver understands and will follow disposition?: Yes    Copied from CRM [id number]. Topic: Clinical - Red Word Triage >> Oct 26, 2023 11:31 AM Shereese L wrote: [facility] that prompted transfer to Nurse Triage: patient stated that her pressure 180/74 Reason for Disposition  Systolic BP >= 180 OR Diastolic >= 110  Answer Assessment - Initial Assessment Questions Additional info: 1) She took a dose of Hydralazine  that she had previously prescribed. Was d/c by nephrology. Advised not to take any more doses unless instructed by pcp or other provider. 2) Just took noon time medication. Metoprolol . Will repeat blood pressure in 45 minutes and call back if not improving or if she develops symptoms.  3) No appointments are available with pcp until Thursday, she is asking to be scheduled today or asap for bp check, med adjustment if needed. Acute scheduled with alternate provider on 10/27/23. She would like to ask Rollene if ok to take additional medication today. She is concerned her pressure will rise in the evening. Requesting to CALL back today [phone] re: can she take additional medication today.    1. BLOOD PRESSURE: What is your blood pressure? Did you take at least two measurements 5 minutes apart?     180/74 2. ONSET: When did you take your blood pressure?     This morning 3. HOW: How did you take your blood pressure? (e.g., automatic home BP monitor, visiting nurse)     Home cuff 4. HISTORY: Do you  have a history of high blood pressure?     yes 5. MEDICINES: Are you taking any medicines for blood pressure? Have you missed any doses recently?     As prescribed 6. OTHER SYMPTOMS: Do you have any symptoms? (e.g., blurred vision, chest pain, difficulty breathing, headache, weakness)     Baseline has blurry vision with increased pressure  Protocols used: Blood Pressure - High-A-AH

## 2023-10-26 NOTE — ED Triage Notes (Signed)
 Pt arrives with c/o HTN over the past couple of weeks. Pt reports readings in the 180s. Pt has a hx of the same and has been taking BP meds. Pt reports her head feels full and her vision is hazy. Pt denies headache, CP, or SOB.

## 2023-10-26 NOTE — Telephone Encounter (Signed)
 FYI Only or Action Required?: FYI only for provider.  Patient was last seen in primary care on 10/18/2023 by Dineen Rollene MATSU, FNP.  Called Nurse Triage reporting Hypertension.  Symptoms began today.  Interventions attempted: Prescription medications: as prescribed, also took discontinued hydralazine .  Symptoms are: gradually worsening.  Triage Disposition: See Physician Within 24 Hours  Patient/caregiver understands and will follow disposition?: Yes   Copied from CRM [id number]. Topic: Clinical - Red Word Triage >> Oct 26, 2023 11:31 AM Charolett L wrote: [facility] that prompted transfer to Nurse Triage: patient stated that her pressure 180/74 >> Oct 26, 2023 12:38 PM Taleah C wrote: Bp just became higher since last speaking w/ triage nurse. At 187/84 Reason for Disposition  Systolic BP >= 180 OR Diastolic >= 110  Answer Assessment - Initial Assessment Questions Additional info: Patient calling back for triage after repeat blood pressure. She would like to have an evaluation today, none are available. Acute visit on 10/27/23 cancelled, proceeding to urgent care.    1. BLOOD PRESSURE: What is your blood pressure? Did you take at least two measurements 5 minutes apart?     One hour ago 180/74, now 45 minutes after metoprolol  187/84 2. ONSET: When did you take your blood pressure?     Just prior to call in  3. HOW: How did you take your blood pressure? (e.g., automatic home BP monitor, visiting nurse)     Home cuff  4. HISTORY: Do you have a history of high blood pressure?     yes 5. MEDICINES: Are you taking any medicines for blood pressure? Have you missed any doses recently?     As prescribed.  6. OTHER SYMPTOMS: Do you have any symptoms? (e.g., blurred vision, chest pain, difficulty breathing, headache, weakness)     Occasional blurry vision-not at this time 7. PREGNANCY: Is there any chance you are pregnant? When was your last menstrual period?  Protocols  used: Blood Pressure - High-A-AH

## 2023-10-26 NOTE — ED Provider Notes (Signed)
 [facility]    CSN: [id number] Arrival date & time: 10/26/23  1300      History   Chief Complaint Chief Complaint  Patient presents with   Hypertension    HPI Mia Perez is a 89 y.o. female.  Accompanied by her sister, patient presents with concern for elevated blood pressure readings at home over the last 2 days.  She states her blood pressure was 185 systolic last night.  She states she has some sensation of film over her eyes and general feeling of malaise.  Something does not feel right in my head.  She took her metoprolol  this morning at 1130.  She denies chest pain, shortness of breath, focal weakness, numbness.  Her medical history includes hypertension, hyperlipidemia, CKD, DVT.  The history is provided by the patient and medical records.    Past Medical History:  Diagnosis Date   Acute otalgia, left 08/06/2020   Anemia    H/O   Arthritis    Breast cancer (HCC) 02/10/1996   left, lumpectomy and radiation   Breast cancer (HCC) 2017   right   CKD (chronic kidney disease), stage III (HCC)    COVID-19 03/30/2023   DVT (deep venous thrombosis) (HCC) 11/2014   Right leg   GERD (gastroesophageal reflux disease)    Hypertension    Lymphocytic colitis    Personal history of radiation therapy 1998, 2017   BREAST CA   Ulcerative colitis (HCC) 2014   Varicose veins    Wears dentures    Full upper, partial lower    Patient Active Problem List   Diagnosis Date Noted   Anemia 07/15/2023   Lung nodule 07/15/2023   History of COVID-19 05/07/2023   Abscess 05/05/2023   Post-phlebitic syndrome 12/26/2019   Trouble getting to sleep 08/30/2019   Macula lutea degeneration 05/24/2019   Multiple renal cysts 03/27/2019   Proteinuria, unspecified 03/27/2019   Edema of lower extremity 03/27/2019   Hyperlipidemia, unspecified 10/05/2018   Prediabetes 11/04/2016   Atrophic vaginitis 05/25/2016   Chronic kidney disease, stage 3 unspecified (HCC) 05/25/2016    History of DVT (deep vein thrombosis) 12/11/2014   Fatigue 09/14/2014   Osteopenia 12/12/2013   Essential hypertension 02/04/2011   Lymphocytic colitis 02/04/2011   Breast cancer (HCC) 02/10/1996    Past Surgical History:  Procedure Laterality Date   BREAST EXCISIONAL BIOPSY Right 2014   NEG   BREAST LUMPECTOMY Left 1998   radiation   BREAST LUMPECTOMY Right 2017   BREAST LUMPECTOMY WITH SENTINEL LYMPH NODE BIOPSY Right 12/30/2015   8 mm, T1b, N0; RUOQ, ER 95%; PR: 80 %, Her 2 neu not overexpressed.Mammosite Radiation.   BREAST SURGERY Right 2014   Microcalcifications excised with wire localization/ Dr Dessa   CATARACT EXTRACTION W/ INTRAOCULAR LENS IMPLANT Left    approx 2010   CATARACT EXTRACTION W/PHACO Right 06/14/2023   Procedure: CATARACT EXTRACTION PHACO AND INTRAOCULAR LENS PLACEMENT (IOC) RIGHT 7.92, 00:47.5;  Surgeon: Myrna Adine Anes, MD;  Location: [facility];  Service: Ophthalmology;  Laterality: Right;   COLONOSCOPY  2012   Dr Luis   DILATION AND CURETTAGE OF UTERUS     HERNIA REPAIR     HYSTEROSCOPY     MELANOMA EXCISION     middle of chest between breasts, Dr Chrystie   NECK SURGERY     PERIPHERAL VASCULAR CATHETERIZATION N/A 12/13/2014   Procedure: IVC Filter Insertion;  Surgeon: Selinda GORMAN Gu, MD;  Location: [facility];  Service: Cardiovascular;  Laterality: N/A;   PERIPHERAL VASCULAR CATHETERIZATION  12/13/2014   Procedure: Lower Extremity Intervention;  Surgeon: Selinda GORMAN Gu, MD;  Location: [facility];  Service: Cardiovascular;;   PERIPHERAL VASCULAR CATHETERIZATION N/A 02/20/2015   Procedure: IVC Filter Removal;  Surgeon: Selinda GORMAN Gu, MD;  Location: [facility];  Service: Cardiovascular;  Laterality: N/A;    OB History     Gravida  1   Para  1   Term      Preterm      AB      Living         SAB      IAB      Ectopic      Multiple      Live Births           Obstetric Comments  1st  Menstrual Cycle:  16 1st Pregnancy:  34           Home Medications    Prior to Admission medications   Medication Sig Start Date End Date Taking? Authorizing Provider  amLODipine  (NORVASC ) 2.5 MG tablet Take 1 tablet (2.5 mg total) by mouth daily. 07/14/23   Dineen Rollene MATSU, FNP  aspirin 325 MG tablet Take 325 mg by mouth daily.    [provider]  budesonide  (ENTOCORT EC ) 3 MG 24 hr capsule TAKE 3 CAPSULES (9 MG TOTAL) BY MOUTH DAILY. 09/22/21   Vanga, Rohini Reddy, MD  cholecalciferol (VITAMIN D ) 25 MCG (1000 UNIT) tablet Take 1,000 Units by mouth daily.    [provider]  dorzolamide (TRUSOPT) 2 % ophthalmic solution 1 drop 2 (two) times daily. 05/22/22   [provider]  dorzolamidel-timolol (COSOPT) 22.3-6.8 MG/ML SOLN ophthalmic solution Place 1 drop into both eyes 2 (two) times daily. Take one drop in both eyes in the morning & evening before bed.    [provider]  ezetimibe  (ZETIA ) 10 MG tablet Take 1 tablet (10 mg total) by mouth daily. 10/18/23   Dineen Rollene MATSU, FNP  fluticasone  (FLONASE ) 50 MCG/ACT nasal spray Place 2 sprays into both nostrils daily. 11/05/22   Dineen Rollene MATSU, FNP  hydrochlorothiazide  (MICROZIDE ) 12.5 MG capsule Take 1 capsule (12.5 mg total) by mouth daily. 07/14/23   Dineen Rollene MATSU, FNP  losartan  (COZAAR ) 100 MG tablet TAKE 1 TABLET BY MOUTH EVERY DAY IN THE EVENING (100 MG) 10/18/23   Dineen Rollene MATSU, FNP  LUMIGAN 0.01 % SOLN Place 1 drop into both eyes at bedtime.  12/09/15   [provider]  metoprolol  succinate (TOPROL -XL) 100 MG 24 hr tablet TAKE 1 TABLET BY MOUTH WITH OR IMMEDIATELY FOLLOWING A MEAL. 03/26/23   Dineen Rollene MATSU, FNP  Multiple Vitamins-Minerals (PRESERVISION AREDS 2 PO) Take 1 tablet by mouth 2 (two) times daily.     [provider]  simethicone (MYLICON) 125 MG chewable tablet Chew 125 mg by mouth every morning.    [provider]    Family History Family  History  Problem Relation Age of Onset   Heart disease Mother    Hypertension Mother    Ovarian cancer Mother 23   Hypertension Sister    Hypertension Brother    Ovarian cancer Paternal Aunt 60   Breast cancer Neg Hx     Social History Social History   Tobacco Use   Smoking status: Never    Passive exposure: Never   Smokeless tobacco: Never  Vaping Use   Vaping status: Never  Used  Substance Use Topics   Alcohol use: No   Drug use: No     Allergies   Pravachol  [pravastatin ] and Penicillins   Review of Systems Review of Systems  Constitutional:  Positive for fatigue. Negative for chills and fever.  Eyes:  Positive for visual disturbance.  Respiratory:  Negative for cough and shortness of breath.   Cardiovascular:  Negative for chest pain and palpitations.  Gastrointestinal:  Negative for nausea and vomiting.  Neurological:  Positive for light-headedness. Negative for dizziness, syncope, weakness and numbness.     Physical Exam Triage Vital Signs ED Triage Vitals  Encounter Vitals Group     BP      Girls Systolic BP Percentile      Girls Diastolic BP Percentile      Boys Systolic BP Percentile      Boys Diastolic BP Percentile      Pulse      Resp      Temp      Temp src      SpO2      Weight      Height      Head Circumference      Peak Flow      Pain Score      Pain Loc      Pain Education      Exclude from Growth Chart    No data found.  Updated Vital Signs BP (!) 178/79   Pulse 60   Temp 98 F (36.7 C)   Resp 17   SpO2 98%   Visual Acuity Right Eye Distance:   Left Eye Distance:   Bilateral Distance:    Right Eye Near:   Left Eye Near:    Bilateral Near:     Physical Exam Constitutional:      General: She is not in acute distress. Neurological:     Mental Status: She is alert.      UC Treatments / Results  Labs (all labs ordered are listed, but only abnormal results are displayed) Labs Reviewed - No data to  display   EKG   Radiology No results found.  Procedures Procedures (including critical care time)  Medications Ordered in UC Medications - No data to display  Initial Impression / Assessment and Plan / UC Course  I have reviewed the triage vital signs and the nursing notes.  Pertinent labs & imaging results that were available during my care of the patient were reviewed by me and considered in my medical decision making (see chart for details).   Hypertensive urgency, elevated blood pressure reading with hypertension, malaise, lightheadedness.  Patient reports something does not feel right in my head.  She reports film over her eyes and generalized feeling of malaise.  Her blood pressure has been elevated at home over the last 2 days with the highest systolic being 185 last night.  EKG shows sinus bradycardia, rate 57, no ST elevation, compared to previous from 2017.  Sending patient to the ED for evaluation.  She is agreeable to this.  Her sister is with her and will drive her to [facility] ED.  They decline EMS.  Final Clinical Impressions(s) / UC Diagnoses   Final diagnoses:  Hypertensive urgency  Elevated blood pressure reading in office with diagnosis of hypertension  Malaise  Lightheadedness     Discharge Instructions      Go to the emergency department for evaluation of your elevated blood pressure with hypertension and your feeling of  general unwellness including something not feeling right in your head.     ED Prescriptions   None    PDMP not reviewed this encounter.   Corlis Burnard DEL, NP 10/26/23 [phone]

## 2023-10-26 NOTE — Telephone Encounter (Signed)
 Called pt on new number no vm has been set up, will try back at later time

## 2023-10-26 NOTE — Telephone Encounter (Unsigned)
 Copied from CRM [id number]. Topic: General - Call Back - No Documentation >> Oct 26, 2023 11:53 AM Mia Perez wrote: Reason for CRM: Patient already spoke with nurse (red word) today for blood pressure. Nurse told her to expect a phone call from clinic. Patient called in to make sure that clinic calls her at her new phone #. Updated # in chart.   [phone] (H)

## 2023-10-26 NOTE — Discharge Instructions (Addendum)
 Go to the emergency department for evaluation of your elevated blood pressure with hypertension and your feeling of general unwellness including something not feeling right in your head.

## 2023-10-26 NOTE — ED Provider Notes (Signed)
 [facility] Provider Note    Event Date/Time   First MD Initiated Contact with Patient 10/26/23 2041     (approximate)   History   Hypertension   HPI  Mia Perez is a 89 y.o. female history of chronic kidney disease remote DVT, hypertension   No chest pain no headache no nausea vomiting no difficulty breathing.  Feels well but in the past has recognized that when her blood pressure goes high she gets a little bit of a slight hard to describe feeling almost like a easy or slight visual feeling that is hard to describe.  She has not lost vision or anything like that reports her symptoms are well-known in the past as well with very much variable blood pressures  She is been taking all of her prescribed blood pressure medication except not her hydrochlorothiazide  recently  Patient relates for the last few days she checks her blood pressure regularly, her blood pressure has been running high sort of like up in the 180 range.    Per primary care notes she has a history of labile blood pressures.  They report a reduction in medication dose and consideration to increase her hydrochlorothiazide  as next step if blood pressure begins to increase that is from her note September 8 this year   Physical Exam   Triage Vital Signs: ED Triage Vitals  Encounter Vitals Group     BP 10/26/23 1421 (!) 187/82     Girls Systolic BP Percentile --      Girls Diastolic BP Percentile --      Boys Systolic BP Percentile --      Boys Diastolic BP Percentile --      Pulse Rate 10/26/23 1421 64     Resp 10/26/23 1421 16     Temp 10/26/23 1421 97.6 F (36.4 C)     Temp Source 10/26/23 1421 Oral     SpO2 10/26/23 1421 96 %     Weight 10/26/23 1422 141 lb (64 kg)     Height 10/26/23 1422 5' 7 (1.702 m)     Head Circumference --      Peak Flow --      Pain Score 10/26/23 1422 0     Pain Loc --      Pain Education --      Exclude from Growth Chart --     Most recent  vital signs: Vitals:   10/26/23 1421 10/26/23 2025  BP: (!) 187/82 (!) 190/85  Pulse: 64 85  Resp: 16 20  Temp: 97.6 F (36.4 C) 98 F (36.7 C)  SpO2: 96% 97%     General: Awake, no distress.  Alert fully oriented very pleasant friend at bedside CV:  Good peripheral perfusion.  Normal tones and rate Resp:  Normal effort.  Clear bilateral Abd:  No distention.  Other:  No facial droop.  Extract movements normal.  Fully alert and oriented.  Moves all extremities normally.  At the present time she reports that the visual feeling are out of symptom that she gets when her blood pressure has actually gone away   ED Results / Procedures / Treatments   Labs (all labs ordered are listed, but only abnormal results are displayed) Labs Reviewed  BASIC METABOLIC PANEL WITH GFR - Abnormal; Notable for the following components:      Result Value   Potassium 3.2 (*)    CO2 21 (*)    Glucose, Bld 128 (*)  BUN 32 (*)    Creatinine, Ser 1.42 (*)    GFR, Estimated 36 (*)    All other components within normal limits  CBC WITH DIFFERENTIAL/PLATELET     EKG  And interpreted by me at 1350 heart rate 60 QRS 80 QTc 420 Normal sinus rhythm, slight artifact.  No evidence of frank ischemia   RADIOLOGY  CT Head Wo Contrast Result Date: 10/26/2023 CLINICAL DATA:  Headache, increasing frequency or severity EXAM: CT HEAD WITHOUT CONTRAST TECHNIQUE: Contiguous axial images were obtained from the base of the skull through the vertex without intravenous contrast. RADIATION DOSE REDUCTION: This exam was performed according to the departmental dose-optimization program which includes automated exposure control, adjustment of the mA and/or kV according to patient size and/or use of iterative reconstruction technique. COMPARISON:  None Available. FINDINGS: Brain: No evidence of acute infarction, hemorrhage, hydrocephalus, extra-axial collection or mass lesion/mass effect. Vascular: No hyperdense vessel or  unexpected calcification. Skull: No acute fracture. Sinuses/Orbits: Clear sinuses.  No acute orbital findings. Other: No mastoid effusions. IMPRESSION: No evidence of acute intracranial abnormality. Electronically Signed   By: Gilmore GORMAN Molt M.D.   On: 10/26/2023 18:26      PROCEDURES:  Critical Care performed: No  Procedures   MEDICATIONS ORDERED IN ED: Medications  potassium chloride  SA (KLOR-CON  M) CR tablet 40 mEq (has no administration in time range)     IMPRESSION / MDM / ASSESSMENT AND PLAN / ED COURSE  I reviewed the triage vital signs and the nursing notes.                              Differential diagnosis includes, but is not limited to, evaluation for hypertensive urgency, no signs or symptoms of it suggest acute ACS pulmonary edema, hypertensive emergency, focal neurologic symptoms etc.  She describes very labile blood pressure.  Following closely with primary doctor for it as well, tracking her blood pressures.  Very reassuring examination she reports resolution of the feeling of a slight lightheadedness or visual disturbance now and I have seen her, granted she has been in the ER several hours.  Blood pressure has improved on its own  Counseled patient at this point I think would best for her to follow-up with her primary care as planned tomorrow morning.  She will take her evening blood pressure medicine including reinitiate her hydrochlorothiazide .  Patient comfortable with this plan  Patient's presentation is most consistent with acute complicated illness / injury requiring diagnostic workup.   Labs demonstrate chronic renal disease but no acute departure.  Mild hypokalemia   Clinical Course as of 10/26/23 2121  Tue Oct 26, 2023  2117 Patient's blood pressures improved currently 159/74.  Fully awake and alert asymptomatic.  Patient is going to take her home evening dose of losartan  as well as hydrochlorothiazide .  She reports she actually has an appointment  tomorrow morning with her doctor for follow-up on her blood pressure as well. [MQ]  2118 Return precautions and treatment recommendations and follow-up discussed with the patient who is agreeable with the plan.  [MQ]    Clinical Course User Index [MQ] Dicky Anes, MD   Potassium slightly low, on hydrochlorothiazide .  Will replete here.  Has follow-up planned tomorrow with Dr.    FINAL CLINICAL IMPRESSION(S) / ED DIAGNOSES   Final diagnoses:  Hypertensive urgency  Hypokalemia     Rx / DC Orders   ED Discharge Orders  None        Note:  This document was prepared using Dragon voice recognition software and may include unintentional dictation errors.   Dicky Anes, MD 10/26/23 2122

## 2023-10-27 ENCOUNTER — Ambulatory Visit

## 2023-10-27 NOTE — Telephone Encounter (Signed)
 Spoke to pt she was seen in ED yesterday. Pt is going to take bp this morning and call me back to see what her numbers are like this morning was not taking d/c meds wanted to know which medication she could take that would lower her BP

## 2023-10-28 MED ORDER — HYDRALAZINE HCL 10 MG PO TABS: 10.0000 mg | ORAL_TABLET | Freq: Three times a day (TID) | ORAL | 2 refills | Status: DC

## 2023-10-28 NOTE — Telephone Encounter (Signed)
 Spoke to pt she has taken bp readings on 10/27/23 and they were . Pt states that she has not taken it today 10/28/23 as of yet, I will call her back after lunch to check and se what her numbers are  147/60  134/92  155/71

## 2023-10-28 NOTE — Telephone Encounter (Signed)
 BP yesterday 173/80 This morning 165-173/80.   Denies HA,vision loss, CP, sob.  She can tell vision is not as clear.   She is compliant amlodipine  2.5 mg every morning ( reduced dose) losartan  100 mg every night,   toprol  100 mg lunch time ,hctz to 12.5 mg every day.   She has taken additional dose of hydrochlorothiazide  12.5mg  today   No longer on hydralazine , and per patient Dr Dennise, she stopped hydralazine ; she has hydralazine  10mg  at home.    Reviewed ED notes   UC 10/26/23 , sent to ED 180/82 , improved to 159/74 Crt 1.42 CT head 10/26/23 No evidence of acute intracranial abnormalit   Last seen by nephrology 05/13/23  92/60 Hypertension and lower extremity edema Blood pressure is currently managed with amlodipine , hydralazine , losartan , metoprolol .  Volume managed with hydrochlorothiazide  12.5 mg daily.  Follow low-salt diet. Since blood pressure is low normal and patient is feeling poorly, discontinue hydralazine  and reassess. Allow blood pressure to be in the 120-130 systolic range.   Plan Take losartan  100mg  NOW ( she usually takes at night) If BP > 140/80, start hydralazine  10mg   qpm Tomorrow start hydralazine  slowly by titrating one dose at time up until hydralazine  10mg  TID.  We will follow up tomorrow

## 2023-10-28 NOTE — Addendum Note (Signed)
 Addended by: Alva Kuenzel G on: 10/28/2023 04:58 PM   Modules accepted: Orders

## 2023-10-28 NOTE — Telephone Encounter (Signed)
 Spoke to pt to check back on her she stated that she took her Bp around 12 it was 165/78 pt states she can feel it in her head does not like that feeling

## 2023-10-29 ENCOUNTER — Telehealth: Payer: Self-pay | Admitting: Family

## 2023-10-29 DIAGNOSIS — I10 Essential (primary) hypertension: Secondary | ICD-10-CM

## 2023-10-29 MED ORDER — AMLODIPINE BESYLATE 2.5 MG PO TABS: 5.0000 mg | ORAL_TABLET | Freq: Every day | ORAL | Status: AC

## 2023-10-29 NOTE — Telephone Encounter (Signed)
 Noted

## 2023-10-29 NOTE — Telephone Encounter (Signed)
   Xzav[facility] Reinig team Please call 11/01/23 to see how she is feeling and how blood pressure is managed on increased dose of amlodipine  5mg     I Called and spoke with pt this morning  She is feeling well. Concerned regarding blood pressure   BP last night 147/70 after hydralazine  10mg   745 am this morning 142/72 And after hydralazine  10mg  this morning; 153/77 She doesn't feel hydralazine  is working.  Previously on amlodipine  5mg  with ''puffy' ankles. She was not bothered by this and would like to resume amlodipine   Denies cp, HA, sob  Advised to increase amlodipine  to 5mg  and monitor BP over the weekend Dc hydralazine  Consult with nephrology if no improvement of HTN management

## 2023-11-01 NOTE — Telephone Encounter (Signed)
 LVM to call back to office to check pt bp numbers from weekend and today

## 2023-11-01 NOTE — Telephone Encounter (Signed)
 LVM to call back to office to check and see how her bp was today

## 2023-11-04 DIAGNOSIS — H26491 Other secondary cataract, right eye: Secondary | ICD-10-CM | POA: Diagnosis not present

## 2023-11-09 NOTE — Telephone Encounter (Signed)
 Spoke to pt her bp has been coming down not a concern anymore

## 2023-11-11 DIAGNOSIS — I129 Hypertensive chronic kidney disease with stage 1 through stage 4 chronic kidney disease, or unspecified chronic kidney disease: Secondary | ICD-10-CM | POA: Diagnosis not present

## 2023-11-11 DIAGNOSIS — R809 Proteinuria, unspecified: Secondary | ICD-10-CM | POA: Diagnosis not present

## 2023-11-11 DIAGNOSIS — I10 Essential (primary) hypertension: Secondary | ICD-10-CM | POA: Diagnosis not present

## 2023-11-11 DIAGNOSIS — R600 Localized edema: Secondary | ICD-10-CM | POA: Diagnosis not present

## 2023-11-11 DIAGNOSIS — N1832 Chronic kidney disease, stage 3b: Secondary | ICD-10-CM | POA: Diagnosis not present

## 2023-11-11 DIAGNOSIS — N281 Cyst of kidney, acquired: Secondary | ICD-10-CM | POA: Diagnosis not present

## 2023-11-18 DIAGNOSIS — N281 Cyst of kidney, acquired: Secondary | ICD-10-CM | POA: Diagnosis not present

## 2023-11-18 DIAGNOSIS — R600 Localized edema: Secondary | ICD-10-CM | POA: Diagnosis not present

## 2023-11-18 DIAGNOSIS — I129 Hypertensive chronic kidney disease with stage 1 through stage 4 chronic kidney disease, or unspecified chronic kidney disease: Secondary | ICD-10-CM | POA: Diagnosis not present

## 2023-11-18 DIAGNOSIS — N1832 Chronic kidney disease, stage 3b: Secondary | ICD-10-CM | POA: Diagnosis not present

## 2023-11-19 ENCOUNTER — Inpatient Hospital Stay: Attending: Oncology

## 2023-11-19 DIAGNOSIS — D472 Monoclonal gammopathy: Secondary | ICD-10-CM

## 2023-11-19 DIAGNOSIS — E876 Hypokalemia: Secondary | ICD-10-CM | POA: Insufficient documentation

## 2023-11-19 DIAGNOSIS — R7689 Other specified abnormal immunological findings in serum: Secondary | ICD-10-CM | POA: Insufficient documentation

## 2023-11-19 DIAGNOSIS — N189 Chronic kidney disease, unspecified: Secondary | ICD-10-CM | POA: Insufficient documentation

## 2023-11-19 DIAGNOSIS — Z8041 Family history of malignant neoplasm of ovary: Secondary | ICD-10-CM | POA: Insufficient documentation

## 2023-11-19 LAB — BASIC METABOLIC PANEL WITH GFR
Anion gap: 9 (ref 5–15)
BUN: 42 mg/dL — ABNORMAL HIGH (ref 8–23)
CO2: 23 mmol/L (ref 22–32)
Calcium: 8.8 mg/dL — ABNORMAL LOW (ref 8.9–10.3)
Chloride: 103 mmol/L (ref 98–111)
Creatinine, Ser: 1.49 mg/dL — ABNORMAL HIGH (ref 0.44–1.00)
GFR, Estimated: 34 mL/min — ABNORMAL LOW (ref >60)
Glucose, Bld: 91 mg/dL (ref 70–99)
Potassium: 3.3 mmol/L — ABNORMAL LOW (ref 3.5–5.1)
Sodium: 135 mmol/L (ref 135–145)

## 2023-11-19 LAB — CBC WITH DIFFERENTIAL/PLATELET
Abs Immature Granulocytes: 0.08 K/uL — ABNORMAL HIGH (ref 0.00–0.07)
Basophils Absolute: 0.1 K/uL (ref 0.0–0.1)
Basophils Relative: 1 %
Eosinophils Absolute: 0.3 K/uL (ref 0.0–0.5)
Eosinophils Relative: 3 %
HCT: 36.6 % (ref 36.0–46.0)
Hemoglobin: 12 g/dL (ref 12.0–15.0)
Immature Granulocytes: 1 %
Lymphocytes Relative: 16 %
Lymphs Abs: 1.6 K/uL (ref 0.7–4.0)
MCH: 29.9 pg (ref 26.0–34.0)
MCHC: 32.8 g/dL (ref 30.0–36.0)
MCV: 91.3 fL (ref 80.0–100.0)
Monocytes Absolute: 0.9 K/uL (ref 0.1–1.0)
Monocytes Relative: 9 %
Neutro Abs: 7.2 K/uL (ref 1.7–7.7)
Neutrophils Relative %: 70 %
Platelets: 274 K/uL (ref 150–400)
RBC: 4.01 MIL/uL (ref 3.87–5.11)
RDW: 14.7 % (ref 11.5–15.5)
WBC: 10.1 K/uL (ref 4.0–10.5)
nRBC: 0 % (ref 0.0–0.2)

## 2023-11-21 LAB — IGG, IGA, IGM
IgA: 407 mg/dL (ref 64–422)
IgG (Immunoglobin G), Serum: 866 mg/dL (ref 586–1602)
IgM (Immunoglobulin M), Srm: 38 mg/dL (ref 26–217)

## 2023-11-22 LAB — PROTEIN ELECTROPHORESIS, SERUM
A/G Ratio: 1 (ref 0.7–1.7)
Albumin ELP: 3 g/dL (ref 2.9–4.4)
Alpha-1-Globulin: 0.2 g/dL (ref 0.0–0.4)
Alpha-2-Globulin: 0.9 g/dL (ref 0.4–1.0)
Beta Globulin: 1.2 g/dL (ref 0.7–1.3)
Gamma Globulin: 0.8 g/dL (ref 0.4–1.8)
Globulin, Total: 3.1 g/dL (ref 2.2–3.9)
Total Protein ELP: 6.1 g/dL (ref 6.0–8.5)

## 2023-11-22 LAB — KAPPA/LAMBDA LIGHT CHAINS
Kappa free light chain: 49.2 mg/L — ABNORMAL HIGH (ref 3.3–19.4)
Kappa, lambda light chain ratio: 1.37 (ref 0.26–1.65)
Lambda free light chains: 35.9 mg/L — ABNORMAL HIGH (ref 5.7–26.3)

## 2023-11-25 ENCOUNTER — Inpatient Hospital Stay: Admitting: Oncology

## 2023-11-25 ENCOUNTER — Encounter: Payer: Self-pay | Admitting: Oncology

## 2023-11-25 VITALS — BP 122/78 | HR 61 | Temp 97.9°F | Resp 18 | Ht 67.0 in | Wt 143.0 lb

## 2023-11-25 DIAGNOSIS — N189 Chronic kidney disease, unspecified: Secondary | ICD-10-CM | POA: Diagnosis not present

## 2023-11-25 DIAGNOSIS — D472 Monoclonal gammopathy: Secondary | ICD-10-CM | POA: Diagnosis not present

## 2023-11-25 DIAGNOSIS — R7689 Other specified abnormal immunological findings in serum: Secondary | ICD-10-CM | POA: Diagnosis not present

## 2023-11-25 DIAGNOSIS — Z8041 Family history of malignant neoplasm of ovary: Secondary | ICD-10-CM | POA: Diagnosis not present

## 2023-11-25 DIAGNOSIS — E876 Hypokalemia: Secondary | ICD-10-CM | POA: Diagnosis not present

## 2023-11-25 NOTE — Progress Notes (Signed)
 [facility]  [phone]) [phone] [phone]) [phone]  ID: Mia Perez OB: [birth year]  MR#: [id number]  RDW#:[id number]  Patient Care Team: Dineen Rollene MATSU, FNP as PCP - General (Family Medicine) Cook, Jayce G, DO as Consulting Physician (Family Medicine) Jacobo Evalene PARAS, MD as Consulting Physician (Oncology)  CHIEF COMPLAINT: Elevated IgA level.  INTERVAL HISTORY: Patient returns to clinic today for repeat laboratory and further evaluation.  She continues to feel well and remains asymptomatic.  She has no neurologic complaints.  She denies any recent fevers or illnesses.  She has a good appetite and denies weight loss.  She denies any pain.  She denies any chest pain, shortness of breath, cough, or hemoptysis.  She denies any nausea, vomiting, constipation, or diarrhea.  She has no urinary complaints.  She offers no specific complaints today.  REVIEW OF SYSTEMS:   Review of Systems  Constitutional: Negative.  Negative for fever, malaise/fatigue and weight loss.  Respiratory: Negative.  Negative for cough, hemoptysis and shortness of breath.   Cardiovascular: Negative.  Negative for chest pain and leg swelling.  Genitourinary: Negative.  Negative for dysuria and hematuria.  Musculoskeletal: Negative.  Negative for back pain.  Skin: Negative.  Negative for rash.  Neurological: Negative.  Negative for dizziness, seizures, weakness and headaches.  Psychiatric/Behavioral: Negative.  The patient is not nervous/anxious.     As per HPI. Otherwise, a complete review of systems is negative.  PAST MEDICAL HISTORY: Past Medical History:  Diagnosis Date   Acute otalgia, left 08/06/2020   Anemia    H/O   Arthritis    Breast cancer (HCC) 02/10/1996   left, lumpectomy and radiation   Breast cancer (HCC) 2017   right   CKD (chronic kidney disease), stage III (HCC)    COVID-19 03/30/2023   DVT (deep venous thrombosis) (HCC) 11/2014   Right leg   GERD  (gastroesophageal reflux disease)    Hypertension    Lymphocytic colitis    Personal history of radiation therapy 1998, 2017   BREAST CA   Ulcerative colitis (HCC) 2014   Varicose veins    Wears dentures    Full upper, partial lower    PAST SURGICAL HISTORY: Past Surgical History:  Procedure Laterality Date   BREAST EXCISIONAL BIOPSY Right 2014   NEG   BREAST LUMPECTOMY Left 1998   radiation   BREAST LUMPECTOMY Right 2017   BREAST LUMPECTOMY WITH SENTINEL LYMPH NODE BIOPSY Right 12/30/2015   8 mm, T1b, N0; RUOQ, ER 95%; PR: 80 %, Her 2 neu not overexpressed.Mammosite Radiation.   BREAST SURGERY Right 2014   Microcalcifications excised with wire localization/ Dr Dessa   CATARACT EXTRACTION W/ INTRAOCULAR LENS IMPLANT Left    approx 2010   CATARACT EXTRACTION W/PHACO Right 06/14/2023   Procedure: CATARACT EXTRACTION PHACO AND INTRAOCULAR LENS PLACEMENT (IOC) RIGHT 7.92, 00:47.5;  Surgeon: Myrna Adine Anes, MD;  Location: [facility];  Service: Ophthalmology;  Laterality: Right;   COLONOSCOPY  2012   Dr Luis   DILATION AND CURETTAGE OF UTERUS     HERNIA REPAIR     HYSTEROSCOPY     MELANOMA EXCISION     middle of chest between breasts, Dr Chrystie   NECK SURGERY     PERIPHERAL VASCULAR CATHETERIZATION N/A 12/13/2014   Procedure: IVC Filter Insertion;  Surgeon: Selinda GORMAN Gu, MD;  Location: [facility];  Service: Cardiovascular;  Laterality: N/A;   PERIPHERAL VASCULAR CATHETERIZATION  12/13/2014   Procedure: Lower Extremity  Intervention;  Surgeon: Selinda GORMAN Gu, MD;  Location: [facility];  Service: Cardiovascular;;   PERIPHERAL VASCULAR CATHETERIZATION N/A 02/20/2015   Procedure: IVC Filter Removal;  Surgeon: Selinda GORMAN Gu, MD;  Location: [facility];  Service: Cardiovascular;  Laterality: N/A;    FAMILY HISTORY: Family History  Problem Relation Age of Onset   Heart disease Mother    Hypertension Mother    Ovarian cancer Mother 32    Hypertension Sister    Hypertension Brother    Ovarian cancer Paternal Aunt 17   Breast cancer Neg Hx     ADVANCED DIRECTIVES (Y/N):  N  HEALTH MAINTENANCE: Social History   Tobacco Use   Smoking status: Never    Passive exposure: Never   Smokeless tobacco: Never  Vaping Use   Vaping status: Never Used  Substance Use Topics   Alcohol use: No   Drug use: No     Colonoscopy:  PAP:  Bone density:  Lipid panel:  Allergies  Allergen Reactions   Pravachol  [Pravastatin ]     diarrhea   Penicillins Rash    Has patient had a PCN reaction causing immediate rash, facial/tongue/throat swelling, SOB or lightheadedness with hypotension: Yes Has patient had a PCN reaction causing severe rash involving mucus membranes or skin necrosis: Unknown Has patient had a PCN reaction that required hospitalization No Has patient had a PCN reaction occurring within the last 10 years: No If all of the above answers are NO, then may proceed with Cephalosporin use.     Current Outpatient Medications  Medication Sig Dispense Refill   amLODipine  (NORVASC ) 2.5 MG tablet Take 2 tablets (5 mg total) by mouth daily.     aspirin 325 MG tablet Take 325 mg by mouth daily.     budesonide  (ENTOCORT EC ) 3 MG 24 hr capsule TAKE 3 CAPSULES (9 MG TOTAL) BY MOUTH DAILY. 270 capsule 11   cholecalciferol (VITAMIN D ) 25 MCG (1000 UNIT) tablet Take 1,000 Units by mouth daily.     dorzolamide (TRUSOPT) 2 % ophthalmic solution 1 drop 2 (two) times daily.     dorzolamidel-timolol (COSOPT) 22.3-6.8 MG/ML SOLN ophthalmic solution Place 1 drop into both eyes 2 (two) times daily. Take one drop in both eyes in the morning & evening before bed.     ezetimibe  (ZETIA ) 10 MG tablet Take 1 tablet (10 mg total) by mouth daily. 90 tablet 3   fluticasone  (FLONASE ) 50 MCG/ACT nasal spray Place 2 sprays into both nostrils daily. 16 g 6   hydrochlorothiazide  (MICROZIDE ) 12.5 MG capsule Take 1 capsule (12.5 mg total) by mouth daily.      losartan  (COZAAR ) 100 MG tablet TAKE 1 TABLET BY MOUTH EVERY DAY IN THE EVENING (100 MG) 90 tablet 3   LUMIGAN 0.01 % SOLN Place 1 drop into both eyes at bedtime.      metoprolol  succinate (TOPROL -XL) 100 MG 24 hr tablet TAKE 1 TABLET BY MOUTH WITH OR IMMEDIATELY FOLLOWING A MEAL. 90 tablet 3   Multiple Vitamins-Minerals (PRESERVISION AREDS 2 PO) Take 1 tablet by mouth 2 (two) times daily.      simethicone (MYLICON) 125 MG chewable tablet Chew 125 mg by mouth every morning.     No current facility-administered medications for this visit.    OBJECTIVE: Vitals:   11/25/23 1051  BP: 122/78  Pulse: 61  Resp: 18  Temp: 97.9 F (36.6 C)  SpO2: 100%     Body mass index is 22.4 kg/m.  ECOG FS:0 - Asymptomatic  General: Well-developed, well-nourished, no acute distress. Eyes: Pink conjunctiva, anicteric sclera. HEENT: Normocephalic, moist mucous membranes. Lungs: No audible wheezing or coughing. Heart: Regular rate and rhythm. Abdomen: Soft, nontender, no obvious distention. Musculoskeletal: No edema, cyanosis, or clubbing. Neuro: Alert, answering all questions appropriately. Cranial nerves grossly intact. Skin: No rashes or petechiae noted. Psych: Normal affect.  LAB RESULTS:  Lab Results  Component Value Date   NA 135 11/19/2023   K 3.3 (L) 11/19/2023   CL 103 11/19/2023   CO2 23 11/19/2023   GLUCOSE 91 11/19/2023   BUN 42 (H) 11/19/2023   CREATININE 1.49 (H) 11/19/2023   CALCIUM  8.8 (L) 11/19/2023   PROT 6.6 07/14/2023   ALBUMIN 3.5 05/10/2023   AST 10 05/10/2023   ALT 6 05/10/2023   ALKPHOS 103 05/10/2023   BILITOT 0.3 05/10/2023   GFRNONAA 34 (L) 11/19/2023   GFRAA 53 (L) 12/26/2015    Lab Results  Component Value Date   WBC 10.1 11/19/2023   NEUTROABS 7.2 11/19/2023   HGB 12.0 11/19/2023   HCT 36.6 11/19/2023   MCV 91.3 11/19/2023   PLT 274 11/19/2023     STUDIES: CT Head Wo Contrast Result Date: 10/26/2023 CLINICAL DATA:  Headache, increasing  frequency or severity EXAM: CT HEAD WITHOUT CONTRAST TECHNIQUE: Contiguous axial images were obtained from the base of the skull through the vertex without intravenous contrast. RADIATION DOSE REDUCTION: This exam was performed according to the departmental dose-optimization program which includes automated exposure control, adjustment of the mA and/or kV according to patient size and/or use of iterative reconstruction technique. COMPARISON:  None Available. FINDINGS: Brain: No evidence of acute infarction, hemorrhage, hydrocephalus, extra-axial collection or mass lesion/mass effect. Vascular: No hyperdense vessel or unexpected calcification. Skull: No acute fracture. Sinuses/Orbits: Clear sinuses.  No acute orbital findings. Other: No mastoid effusions. IMPRESSION: No evidence of acute intracranial abnormality. Electronically Signed   By: Gilmore GORMAN Molt M.D.   On: 10/26/2023 18:26    ASSESSMENT: Elevated IgA level.  PLAN:    Elevated IgA level: Resolved.  Patient previously noted to have a mildly elevated IgA level of 430, but repeat IgA levels x 2 continue to be within normal limits.  She does not have an M spike on her SPEP.  Both kappa free light chains and lambda free light chains remain chronically elevated, but her ratio is within normal limits.  This is clinically insignificant.  No other intervention is needed.  She does not require vulvar biopsy.  No further follow-up is necessary.  Renal insufficiency: Chronic and unchanged.  Patient's most recent creatinine is 1.49.  She has had a mildly increased creatinine level since at least November 2016.  Continue monitoring per primary care. Hypokalemia: Chronic and unchanged.  Continue dietary changes as recommended.   Patient expressed understanding and was in agreement with this plan. She also understands that She can call clinic at any time with any questions, concerns, or complaints.    Evalene JINNY Reusing, MD   11/25/2023 11:01 AM

## 2023-11-25 NOTE — Progress Notes (Signed)
 Patient is doing well

## 2023-11-26 ENCOUNTER — Ambulatory Visit: Admitting: Oncology

## 2023-11-29 ENCOUNTER — Telehealth: Payer: Self-pay | Admitting: Family

## 2023-11-29 ENCOUNTER — Encounter: Payer: Self-pay | Admitting: Family

## 2023-11-29 ENCOUNTER — Other Ambulatory Visit: Payer: Self-pay

## 2023-11-29 DIAGNOSIS — D472 Monoclonal gammopathy: Secondary | ICD-10-CM | POA: Insufficient documentation

## 2023-11-29 DIAGNOSIS — I10 Essential (primary) hypertension: Secondary | ICD-10-CM

## 2023-11-29 MED ORDER — POTASSIUM CHLORIDE CRYS ER 10 MEQ PO TBCR
10.0000 meq | EXTENDED_RELEASE_TABLET | Freq: Every day | ORAL | 3 refills | Status: DC
  Filled 2023-11-29: qty 90, 90d supply, fill #0

## 2023-11-29 MED ORDER — POTASSIUM CHLORIDE CRYS ER 10 MEQ PO TBCR: 10.0000 meq | EXTENDED_RELEASE_TABLET | Freq: Every day | ORAL | 3 refills | Status: AC

## 2023-11-29 NOTE — Telephone Encounter (Signed)
 Spoke to pt she verbalized understanding, scheduled lab appt for 12/07/23

## 2023-11-29 NOTE — Telephone Encounter (Signed)
 Patient need lab orders.

## 2023-11-29 NOTE — Telephone Encounter (Signed)
 Call pt Reviewed dr jerone note ( oncology)  Her potassium is low likely due to hydrochlorothiazide  ( diuretic) for blood pressure  I have sent in low dose potassium for her to start indefinitely while on hydrochlorothiazide   Order BMP lab and sch in 1 week to ensure potassium normalized

## 2023-11-30 ENCOUNTER — Other Ambulatory Visit: Payer: Self-pay | Admitting: Family

## 2023-11-30 DIAGNOSIS — I10 Essential (primary) hypertension: Secondary | ICD-10-CM

## 2023-12-07 ENCOUNTER — Other Ambulatory Visit

## 2023-12-07 DIAGNOSIS — I10 Essential (primary) hypertension: Secondary | ICD-10-CM

## 2023-12-07 LAB — BASIC METABOLIC PANEL WITH GFR
BUN: 32 mg/dL — ABNORMAL HIGH (ref 6–23)
CO2: 28 meq/L (ref 19–32)
Calcium: 8.8 mg/dL (ref 8.4–10.5)
Chloride: 103 meq/L (ref 96–112)
Creatinine, Ser: 1.44 mg/dL — ABNORMAL HIGH (ref 0.40–1.20)
GFR: 32.63 mL/min — ABNORMAL LOW (ref >60.00)
Glucose, Bld: 68 mg/dL — ABNORMAL LOW (ref 70–99)
Potassium: 3.8 meq/L (ref 3.5–5.1)
Sodium: 140 meq/L (ref 135–145)

## 2023-12-08 ENCOUNTER — Ambulatory Visit: Payer: Self-pay | Admitting: Family

## 2023-12-15 DIAGNOSIS — D2262 Melanocytic nevi of left upper limb, including shoulder: Secondary | ICD-10-CM | POA: Diagnosis not present

## 2023-12-15 DIAGNOSIS — Z85828 Personal history of other malignant neoplasm of skin: Secondary | ICD-10-CM | POA: Diagnosis not present

## 2023-12-15 DIAGNOSIS — Z85820 Personal history of malignant melanoma of skin: Secondary | ICD-10-CM | POA: Diagnosis not present

## 2023-12-15 DIAGNOSIS — D2261 Melanocytic nevi of right upper limb, including shoulder: Secondary | ICD-10-CM | POA: Diagnosis not present

## 2023-12-15 DIAGNOSIS — D485 Neoplasm of uncertain behavior of skin: Secondary | ICD-10-CM | POA: Diagnosis not present

## 2023-12-15 DIAGNOSIS — Z86006 Personal history of melanoma in-situ: Secondary | ICD-10-CM | POA: Diagnosis not present

## 2023-12-15 DIAGNOSIS — D2271 Melanocytic nevi of right lower limb, including hip: Secondary | ICD-10-CM | POA: Diagnosis not present

## 2023-12-15 DIAGNOSIS — D2272 Melanocytic nevi of left lower limb, including hip: Secondary | ICD-10-CM | POA: Diagnosis not present

## 2023-12-15 DIAGNOSIS — D0462 Carcinoma in situ of skin of left upper limb, including shoulder: Secondary | ICD-10-CM | POA: Diagnosis not present

## 2024-01-12 DIAGNOSIS — D0462 Carcinoma in situ of skin of left upper limb, including shoulder: Secondary | ICD-10-CM | POA: Diagnosis not present

## 2024-01-27 ENCOUNTER — Ambulatory Visit: Admitting: Family

## 2024-01-29 ENCOUNTER — Other Ambulatory Visit: Payer: Self-pay | Admitting: Family

## 2024-02-01 ENCOUNTER — Other Ambulatory Visit: Payer: Self-pay | Admitting: Family

## 2024-02-01 DIAGNOSIS — Z1231 Encounter for screening mammogram for malignant neoplasm of breast: Secondary | ICD-10-CM

## 2024-02-16 ENCOUNTER — Encounter: Payer: Self-pay | Admitting: Family

## 2024-02-16 ENCOUNTER — Ambulatory Visit: Admitting: Family

## 2024-02-16 VITALS — BP 124/76 | HR 65 | Temp 98.0°F | Ht 67.0 in | Wt 146.2 lb

## 2024-02-16 DIAGNOSIS — Z780 Asymptomatic menopausal state: Secondary | ICD-10-CM | POA: Diagnosis not present

## 2024-02-16 DIAGNOSIS — R600 Localized edema: Secondary | ICD-10-CM

## 2024-02-16 DIAGNOSIS — I10 Essential (primary) hypertension: Secondary | ICD-10-CM

## 2024-02-16 LAB — COMPREHENSIVE METABOLIC PANEL WITH GFR
ALT: 10 U/L (ref 3–35)
AST: 12 U/L (ref 5–37)
Albumin: 3.8 g/dL (ref 3.5–5.2)
Alkaline Phosphatase: 81 U/L (ref 39–117)
BUN: 38 mg/dL — ABNORMAL HIGH (ref 6–23)
CO2: 26 meq/L (ref 19–32)
Calcium: 8.5 mg/dL (ref 8.4–10.5)
Chloride: 104 meq/L (ref 96–112)
Creatinine, Ser: 1.39 mg/dL — ABNORMAL HIGH (ref 0.40–1.20)
GFR: 34 mL/min — ABNORMAL LOW
Glucose, Bld: 77 mg/dL (ref 70–99)
Potassium: 3.7 meq/L (ref 3.5–5.1)
Sodium: 139 meq/L (ref 135–145)
Total Bilirubin: 0.3 mg/dL (ref 0.2–1.2)
Total Protein: 6.5 g/dL (ref 6.0–8.3)

## 2024-02-16 MED ORDER — HYDROCHLOROTHIAZIDE 12.5 MG PO CAPS: 12.5000 mg | ORAL_CAPSULE | Freq: Every day | ORAL | 1 refills | Status: AC | PRN

## 2024-02-16 NOTE — Assessment & Plan Note (Signed)
 Chronic, stable.  Continue amlodipine  5 mg every morning ,losartan  100 mg every night,   toprol  100 mg . She takes hctz to 12.5 mg every day prn.  Leg swelling has improved.  Due to history of labile blood pressure, we did discuss that she may take an additional dose of hydrochlorothiazide  12.5mg  if blood pressure > 140/80.   No longer on hydralazine .  Will monitor

## 2024-02-16 NOTE — Progress Notes (Signed)
 "  Assessment & Plan:  Essential hypertension Assessment & Plan: Chronic, stable.  Continue amlodipine  5 mg every morning ,losartan  100 mg every night,   toprol  100 mg . She takes hctz to 12.5 mg every day prn.  Leg swelling has improved.  Due to history of labile blood pressure, we did discuss that she may take an additional dose of hydrochlorothiazide  12.5mg  if blood pressure > 140/80.   No longer on hydralazine .  Will monitor   Orders: -     ECHOCARDIOGRAM COMPLETE; Future -     Comprehensive metabolic panel with GFR  Edema of lower extremity Assessment & Plan: Edema is symmetric on exam today.  Ordered echocardiogram to screen for CHF although in no grossly decompensated symptoms  today.  Pending peripheral artery ultrasound . Encouraged continued compression stockings, elevation.  She politely declines decreasing amlodipine  at this time  Orders: -     US  ARTERIAL ABI (SCREENING LOWER EXTREMITY); Future  Asymptomatic postmenopausal state -     DG Bone Density; Future     Return precautions given.   Risks, benefits, and alternatives of the medications and treatment plan prescribed today were discussed, and patient expressed understanding.   Education regarding symptom management and diagnosis given to patient on AVS either electronically or printed.  Return in about 3 months (around 05/16/2024).  Rollene Northern, FNP  Subjective:    [name]: Mia Perez, female    DOB: April 18, 1933, 89 y.o.   MRN: [id number]  CC: Mia Perez is a 89 y.o. female who presents today for follow up.   HPI: HPI Discussed the use of AI scribe software for clinical note transcription with the patient, who gave verbal consent to proceed.  History of Present Illness   Mia Perez is an 89 year old female with hypertension who presents for a blood pressure follow-up.  Her blood pressure readings have been excellent during the day.   However, by 6 PM, her blood pressure rises to  the 140s, and it has reached as high as 160 mmHg SBP once since her last visit. She is currently taking amlodipine  5 mg daily, which was increased from 2.5 mg after a previous emergency room visit due to blood pressure issues. She also takes hydrochlorothiazide  as needed, but not daily, due to concerns about low blood pressure causing dizziness.  She experiences persistent BL swelling in her legs.The swelling improves with elevation at night and is better by the next morning, but it recurs during the day. Denies shortness of breath, orthopnea.  H/o DVT  2016. She is currently on aspirin 325 mg.    Mammogram scheduled   Allergies: Pravachol  [pravastatin ] and Penicillins Medications Ordered Prior to Encounter[1]  Review of Systems  Constitutional:  Negative for chills and fever.  Respiratory:  Negative for cough and shortness of breath.   Cardiovascular:  Positive for leg swelling. Negative for chest pain and palpitations.  Gastrointestinal:  Negative for nausea and vomiting.      Objective:    BP 124/76   Pulse 65   Temp 98 F (36.7 C) (Oral)   Ht 5' 7 (1.702 m)   Wt 146 lb 3.2 oz (66.3 kg)   SpO2 98%   BMI 22.90 kg/m  BP Readings from Last 3 Encounters:  02/16/24 124/76  11/25/23 122/78  10/26/23 (!) 164/81   Wt Readings from Last 3 Encounters:  02/16/24 146 lb 3.2 oz (66.3 kg)  11/25/23 143 lb (64.9 kg)  10/26/23 141 lb (  64 kg)    Physical Exam Vitals reviewed.  Constitutional:      Appearance: She is well-developed.  Eyes:     Conjunctiva/sclera: Conjunctivae normal.  Neck:     Vascular: No carotid bruit.  Cardiovascular:     Rate and Rhythm: Normal rate and regular rhythm.     Pulses: Normal pulses.     Heart sounds: Normal heart sounds.     Comments: Non pitting +1 BLE edema. No palpable cords or masses. No erythema or increased warmth. No asymmetry in calf size when compared bilaterally LE hair growth symmetric and present. No discoloration or  varicosities noted. LE warm and diminished pedal pulse bilaterally.  Pulmonary:     Effort: Pulmonary effort is normal.     Breath sounds: Normal breath sounds. No wheezing, rhonchi or rales.  Musculoskeletal:     Right lower leg: 1+ Edema present.     Left lower leg: 1+ Edema present.  Skin:    General: Skin is warm and dry.  Neurological:     Mental Status: She is alert.  Psychiatric:        Speech: Speech normal.        Behavior: Behavior normal.        Thought Content: Thought content normal.           [1]  Current Outpatient Medications on File Prior to Visit  Medication Sig Dispense Refill   amLODipine  (NORVASC ) 5 MG tablet TAKE 1 TABLET BY MOUTH ONCE A DAY 90 tablet 2   aspirin 325 MG tablet Take 325 mg by mouth daily.     budesonide  (ENTOCORT EC ) 3 MG 24 hr capsule TAKE 3 CAPSULES (9 MG TOTAL) BY MOUTH DAILY. 270 capsule 11   cholecalciferol (VITAMIN D ) 25 MCG (1000 UNIT) tablet Take 1,000 Units by mouth daily.     dorzolamide (TRUSOPT) 2 % ophthalmic solution 1 drop 2 (two) times daily.     dorzolamidel-timolol (COSOPT) 22.3-6.8 MG/ML SOLN ophthalmic solution Place 1 drop into both eyes 2 (two) times daily. Take one drop in both eyes in the morning & evening before bed.     ezetimibe  (ZETIA ) 10 MG tablet Take 1 tablet (10 mg total) by mouth daily. 90 tablet 3   fluticasone  (FLONASE ) 50 MCG/ACT nasal spray Place 2 sprays into both nostrils daily. 16 g 6   losartan  (COZAAR ) 100 MG tablet TAKE 1 TABLET BY MOUTH EVERY DAY IN THE EVENING (100 MG) 90 tablet 3   LUMIGAN 0.01 % SOLN Place 1 drop into both eyes at bedtime.      metoprolol  succinate (TOPROL -XL) 100 MG 24 hr tablet TAKE 1 TABLET BY MOUTH WITH OR IMMEDIATELY FOLLOWING A MEAL. 90 tablet 3   Multiple Vitamins-Minerals (PRESERVISION AREDS 2 PO) Take 1 tablet by mouth 2 (two) times daily.      potassium chloride  (KLOR-CON  M) 10 MEQ tablet Take 1 tablet (10 mEq total) by mouth daily. 90 tablet 3   simethicone (MYLICON)  125 MG chewable tablet Chew 125 mg by mouth every morning.     amLODipine  (NORVASC ) 2.5 MG tablet Take 2 tablets (5 mg total) by mouth daily. (Patient not taking: Reported on 02/16/2024)     hydrochlorothiazide  (MICROZIDE ) 12.5 MG capsule Take 1 capsule (12.5 mg total) by mouth daily. (Patient not taking: Reported on 02/16/2024)     No current facility-administered medications on file prior to visit.   "

## 2024-02-16 NOTE — Assessment & Plan Note (Signed)
 Edema is symmetric on exam today.  Ordered echocardiogram to screen for CHF although in no grossly decompensated symptoms  today.  Pending peripheral artery ultrasound . Encouraged continued compression stockings, elevation.  She politely declines decreasing amlodipine  at this time

## 2024-02-16 NOTE — Patient Instructions (Signed)
 I have ordered an ultrasound of your legs to evaluate your circulation.  Also ordered an ultrasound of your heart to evaluate for structural heart disease, heart failure.  Let us  know if you dont hear back within 2 weeks in regards to an appointment being scheduled.   So that you are aware, if you are [facility] MyChart user , please pay attention to your MyChart messages as you may receive a MyChart message with a phone number to call and schedule this test/appointment own your own from our referral coordinator. This is a new process so I do not want you to miss this message.  If you are not a MyChart user, you will receive a phone call.    Nice to see you!

## 2024-02-17 ENCOUNTER — Ambulatory Visit: Payer: Self-pay | Admitting: Family

## 2024-02-21 ENCOUNTER — Ambulatory Visit
Admission: RE | Admit: 2024-02-21 | Discharge: 2024-02-21 | Disposition: A | Discharge status: Home / Self Care | Source: Ambulatory Visit | Attending: Family | Admitting: Family

## 2024-02-21 DIAGNOSIS — R600 Localized edema: Secondary | ICD-10-CM | POA: Diagnosis present

## 2024-02-24 ENCOUNTER — Ambulatory Visit: Admission: RE | Admit: 2024-02-24

## 2024-03-03 ENCOUNTER — Encounter

## 2024-03-08 ENCOUNTER — Encounter

## 2024-03-08 ENCOUNTER — Other Ambulatory Visit

## 2024-03-17 ENCOUNTER — Other Ambulatory Visit: Payer: Self-pay | Admitting: Family

## 2024-03-17 DIAGNOSIS — I10 Essential (primary) hypertension: Secondary | ICD-10-CM

## 2024-03-27 ENCOUNTER — Ambulatory Visit

## 2024-04-17 ENCOUNTER — Ambulatory Visit: Admitting: Family

## 2024-05-22 ENCOUNTER — Ambulatory Visit: Admitting: Family

## 2024-06-13 ENCOUNTER — Ambulatory Visit
